# Patient Record
Sex: Female | Born: 1940
Health system: Southern US, Community
[De-identification: ages and names within clinical notes are randomized; demographics above are authoritative.]

## PROBLEM LIST (undated history)

## (undated) DIAGNOSIS — R32 Unspecified urinary incontinence: Secondary | ICD-10-CM

## (undated) DIAGNOSIS — R51 Headache: Secondary | ICD-10-CM

## (undated) DIAGNOSIS — I739 Peripheral vascular disease, unspecified: Secondary | ICD-10-CM

## (undated) DIAGNOSIS — I69398 Other sequelae of cerebral infarction: Principal | ICD-10-CM

## (undated) DIAGNOSIS — M21372 Foot drop, left foot: Secondary | ICD-10-CM

## (undated) DIAGNOSIS — I69359 Hemiplegia and hemiparesis following cerebral infarction affecting unspecified side: Secondary | ICD-10-CM

## (undated) DIAGNOSIS — E7800 Pure hypercholesterolemia, unspecified: Secondary | ICD-10-CM

## (undated) DIAGNOSIS — S129XXA Fracture of neck, unspecified, initial encounter: Secondary | ICD-10-CM

## (undated) DIAGNOSIS — M810 Age-related osteoporosis without current pathological fracture: Secondary | ICD-10-CM

## (undated) DIAGNOSIS — I2510 Atherosclerotic heart disease of native coronary artery without angina pectoris: Secondary | ICD-10-CM

## (undated) DIAGNOSIS — Z780 Asymptomatic menopausal state: Secondary | ICD-10-CM

## (undated) DIAGNOSIS — I82459 Acute embolism and thrombosis of unspecified peroneal vein: Secondary | ICD-10-CM

## (undated) DIAGNOSIS — I219 Acute myocardial infarction, unspecified: Secondary | ICD-10-CM

## (undated) DIAGNOSIS — I639 Cerebral infarction, unspecified: Secondary | ICD-10-CM

## (undated) DIAGNOSIS — R079 Chest pain, unspecified: Secondary | ICD-10-CM

## (undated) DIAGNOSIS — I10 Essential (primary) hypertension: Secondary | ICD-10-CM

## (undated) DIAGNOSIS — R569 Unspecified convulsions: Secondary | ICD-10-CM

## (undated) DIAGNOSIS — R269 Unspecified abnormalities of gait and mobility: Secondary | ICD-10-CM

## (undated) DIAGNOSIS — K5900 Constipation, unspecified: Secondary | ICD-10-CM

## (undated) DIAGNOSIS — R002 Palpitations: Secondary | ICD-10-CM

## (undated) DIAGNOSIS — H05232 Hemorrhage of left orbit: Secondary | ICD-10-CM

## (undated) HISTORY — DX: Unspecified convulsions: R56.9

## (undated) HISTORY — DX: Acute embolism and thrombosis of unspecified peroneal vein: I82.459

## (undated) HISTORY — DX: Atherosclerotic heart disease of native coronary artery without angina pectoris: I25.10

## (undated) HISTORY — DX: Headache: R51

## (undated) HISTORY — DX: Unspecified abnormalities of gait and mobility: R26.9

## (undated) HISTORY — DX: Chest pain, unspecified: R07.9

## (undated) HISTORY — DX: Constipation, unspecified: K59.00

## (undated) HISTORY — DX: Asymptomatic menopausal state: Z78.0

## (undated) HISTORY — DX: Hemiplegia and hemiparesis following cerebral infarction affecting unspecified side: I69.359

## (undated) HISTORY — DX: Peripheral vascular disease, unspecified: I73.9

## (undated) HISTORY — DX: Cerebral infarction, unspecified: I63.9

## (undated) HISTORY — DX: Hemorrhage of left orbit: H05.232

## (undated) HISTORY — DX: Fracture of neck, unspecified, initial encounter: S12.9XXA

## (undated) HISTORY — DX: Palpitations: R00.2

## (undated) HISTORY — DX: Other sequelae of cerebral infarction: I69.398

## (undated) HISTORY — DX: Foot drop, left foot: M21.372

## (undated) HISTORY — PX: CATARACT EXTRACTION: SUR2

## (undated) HISTORY — PX: CORONARY ARTERY BYPASS GRAFT: SHX141

## (undated) HISTORY — DX: Acute myocardial infarction, unspecified: I21.9

## (undated) HISTORY — DX: Age-related osteoporosis without current pathological fracture: M81.0

## (undated) HISTORY — DX: Unspecified urinary incontinence: R32

## (undated) HISTORY — DX: Essential (primary) hypertension: I10

## (undated) HISTORY — DX: Pure hypercholesterolemia, unspecified: E78.00

---

## 1995-09-01 DIAGNOSIS — I2510 Atherosclerotic heart disease of native coronary artery without angina pectoris: Secondary | ICD-10-CM

## 1995-09-01 HISTORY — DX: Atherosclerotic heart disease of native coronary artery without angina pectoris: I25.10

## 2008-08-31 DIAGNOSIS — I639 Cerebral infarction, unspecified: Secondary | ICD-10-CM

## 2008-08-31 HISTORY — DX: Cerebral infarction, unspecified: I63.9

## 2013-03-14 ENCOUNTER — Ambulatory Visit (INDEPENDENT_AMBULATORY_CARE_PROVIDER_SITE_OTHER): Payer: Medicare HMO | Admitting: Neurology

## 2013-03-14 ENCOUNTER — Encounter: Payer: Self-pay | Admitting: Neurology

## 2013-03-14 VITALS — BP 151/70 | HR 65 | Wt 180.0 lb

## 2013-03-14 DIAGNOSIS — R209 Unspecified disturbances of skin sensation: Secondary | ICD-10-CM

## 2013-03-14 DIAGNOSIS — R519 Headache, unspecified: Secondary | ICD-10-CM | POA: Insufficient documentation

## 2013-03-14 DIAGNOSIS — I69959 Hemiplegia and hemiparesis following unspecified cerebrovascular disease affecting unspecified side: Secondary | ICD-10-CM

## 2013-03-14 DIAGNOSIS — R51 Headache: Secondary | ICD-10-CM

## 2013-03-14 DIAGNOSIS — I69359 Hemiplegia and hemiparesis following cerebral infarction affecting unspecified side: Secondary | ICD-10-CM | POA: Insufficient documentation

## 2013-03-14 DIAGNOSIS — I69398 Other sequelae of cerebral infarction: Secondary | ICD-10-CM

## 2013-03-14 HISTORY — DX: Headache: R51

## 2013-03-14 HISTORY — DX: Hemiplegia and hemiparesis following cerebral infarction affecting unspecified side: I69.398

## 2013-03-14 HISTORY — DX: Other sequelae of cerebral infarction: I69.359

## 2013-03-14 NOTE — Progress Notes (Signed)
Reason for visit: Stroke history  [name] [name] is a 72 y.o. female  History of present illness:  [name] [name] is a 72 year old right-handed black female with a history of cerebrovascular disease and coronary artery disease. The patient sustained a stroke in 2010 that left her with a significant left hemiparesis and hemisensory deficit. The patient has required assistance for activities of daily living such as bathing, dressing, and with ambulation. The patient is able to feed herself. The patient currently lives with her daughter in the [place], [place] area, moving from [place], [place]. The patient has been here 2 months. The patient has no medical history information with her, and she is unable to recall exactly what caused her stroke. The patient has been placed on Coumadin since the stroke, and she was placed on Plavix following a stent procedure for coronary artery disease in 2012. The patient has had some problems with rectal bleeding, and a colonoscopy is indicated. The patient is followed by Dr. Evette Cristal, and there is a question whether she is able to come off of Coumadin at this point. The family indicates that she has no known history of atrial fibrillation, or heart valve disease. The patient was told that she was on Coumadin for a "blocked blood vessel". The patient has had seizures following the stroke, and she is on very low-dose Keppra without recurrence of seizures. The last seizure was in 2012. The patient is sent to this office for an evaluation. The patient reports chronic daily headaches that are associated with neck pain and stiffness. The headaches come up the back of the head to the bifrontal region. The patient has a history of a cervical spine fracture following a fall after her stroke, not requiring surgery.  Past Medical History  Diagnosis Date  . CAD (coronary artery disease)   . Stroke   . Hypertension   . Hypercholesterolemia   . Peroneal DVT (deep venous  thrombosis)   . Osteoporosis   . Seizures   . Post-menopausal   . Heart attack   . Peripheral vascular disease     Left superficial femoral artery  . Hemiparesis and alteration of sensations as late effects of stroke 03/14/2013  . Headache(784.0) 03/14/2013  . Cervical spine fracture     Not requiring surgery    Past Surgical History  Procedure Laterality Date  . Cataract extraction    . Coronary artery bypass graft      x3    Family History  Problem Relation Age of Onset  . Heart attack Mother   . Heart attack Father   . Heart attack Sister   . Heart attack Brother     Social history:  reports that she has never smoked. She does not have any smokeless tobacco history on file. She reports that she does not drink alcohol or use illicit drugs.  Medications:  No current outpatient prescriptions on file prior to visit.   No current facility-administered medications on file prior to visit.    Allergies:  Allergies  Allergen Reactions  . Latex Rash  . Penicillins Rash    Hives    ROS:  Out of a complete 14 system review of symptoms, the patient complains only of the following symptoms, and all other reviewed systems are negative.  Weakness, numbness Blood in stool, constipation Joint pain  Blood pressure 151/70, pulse 65, weight 180 lb (81.647 kg).  Physical Exam  General: The patient is alert and cooperative at the time of the examination.  Head: Pupils are equal, round, and reactive to light. Discs are flat bilaterally.  Neck: The neck is supple, no carotid bruits are noted.  Respiratory: The respiratory examination is clear.  Cardiovascular: The cardiovascular examination reveals a regular rate and rhythm, no obvious murmurs or rubs are noted.  Skin: Extremities are without significant edema. The patient wears an AFO brace with the left foot and ankle.  Neurologic Exam  Mental status:  Cranial nerves: Facial symmetry is not present. The patient has  compression of the left nasolabial fold. There is good sensation of the face to pinprick and soft touch bilaterally. The strength of the facial muscles and the muscles to head turning and shoulder shrug are normal bilaterally. Speech is well enunciated, no aphasia or dysarthria is noted. Extraocular movements are full. Visual fields are full, with the exception that the patient has a left inferior quadrantanopsia.  Motor: The motor testing reveals 5 over 5 strength of the right extremities. On the left, the left upper extremity is almost plegic. The patient has 4 minus over 5 strength with extension of the knee on the left leg, 3/5 strength with flexion at the left knee. The patient has a prominent footdrop on the left. Good symmetric motor tone is noted throughout.  Sensory: Sensory testing is intact to pinprick, soft touch, vibration sensation, and position sense on the right extremities, with the exception that position sense is decreased on the right foot. The patient has a significant decrease in pinprick, soft touch, vibration sensation, and position sensation on the left arm and leg. Extinction is noted on the left.  Coordination: Cerebellar testing reveals good finger-nose-finger and heel-to-shin on the right, unable to perform on the left.  Gait and station: The patient is able to stand with assistance, but she is not able to ambulate effectively. Romberg is negative. Tandem gait was not attempted.  Reflexes: Deep tendon reflexes are symmetric and normal bilaterally, with exception of some increase in deep tendon reflexes on the left ankle jerk reflex and left biceps reflex. Toes are downgoing on the right, upgoing on the left.   Assessment/Plan:  One. Right brain stroke  2. Chronic anticoagulation  3. Coronary artery disease  4. History of seizures  5. Gait disorder  The etiology of the stroke is unknown at this time. We are going to attempt to get further medical records. The  indications for Coumadin following stroke were quite limited, and generally involve a significant cardiac issue such as atrial fibrillation, severe cardiomyopathy, or heart valve disease. Unless one of these issues is present, there may be no indication for ongoing use of Coumadin. At any rate, the stroke was in 2010, and it is likely that the patient could come off of Coumadin temporarily for the colonoscopy, and be restarted following the procedure. An EKG should be performed prior to this procedure. I will followup with this patient if needed. We will call the cardiologist in [place], [place], and obtain medical records.   Addendum: We obtained medical records from her cardiologist. Her ejection fraction on a 2-D echocardiogram is 55%. There is no history of atrial fibrillation. It appears that the Coumadin was started secondary to a DVT affecting the left leg. The DVT occurred in February 2011. Coumadin was not given for cerebrovascular disease. Unless there is a history of recurrent DVT, there is no indication for ongoing treatment with Coumadin. Potentially, Coumadin could be stopped, and the patient treated only with Plavix.  Marlan Palau MD 03/14/2013 1:09  PM  [facility] [address] [place], [place] [address]  Phone [phone] Fax [phone]

## 2013-06-22 ENCOUNTER — Telehealth: Payer: Self-pay | Admitting: *Deleted

## 2013-06-22 NOTE — Telephone Encounter (Signed)
I called the office of Dr. Evette Cristal. The patient was placed on Coumadin in 2011 for DVT. There is no cerebrovascular indication for Coumadin. I indicated in my letter from July of 2014 she likely to come off of Coumadin, remain off of Coumadin, and be treated with Plavix only. Okay to stop the Coumadin. I would not restart this medication.

## 2013-06-22 NOTE — Telephone Encounter (Signed)
Hi Mellody Dance,   This got sent to me and I believe she is your patient. Thanks.

## 2013-06-23 ENCOUNTER — Telehealth: Payer: Self-pay | Admitting: Neurology

## 2013-06-28 NOTE — Telephone Encounter (Signed)
I called the patient. I discussed the use of Coumadin. The Coumadin was initially started in 2011 following a DVT in the left leg. There is no evidence of other ongoing issues such as atrial fibrillation, or a hypercoagulable state. There does not appear to be any evidence to support ongoing use of Coumadin.

## 2013-06-28 NOTE — Telephone Encounter (Addendum)
[name] said that she spoke with Dr Anne Hahn and he said that patient could stop taking the  coumadin, wanted to confirm

## 2013-09-29 ENCOUNTER — Encounter (HOSPITAL_COMMUNITY): Payer: Self-pay | Admitting: Emergency Medicine

## 2013-09-29 ENCOUNTER — Emergency Department (HOSPITAL_COMMUNITY)
Admission: EM | Admit: 2013-09-29 | Discharge: 2013-09-29 | Disposition: A | Discharge status: Home / Self Care | Payer: Medicare PPO | Attending: Emergency Medicine | Admitting: Emergency Medicine

## 2013-09-29 ENCOUNTER — Emergency Department (HOSPITAL_COMMUNITY): Payer: Medicare PPO

## 2013-09-29 DIAGNOSIS — Z8781 Personal history of (healed) traumatic fracture: Secondary | ICD-10-CM | POA: Insufficient documentation

## 2013-09-29 DIAGNOSIS — Z91040 Latex allergy status: Secondary | ICD-10-CM | POA: Insufficient documentation

## 2013-09-29 DIAGNOSIS — Z880 Allergy status to penicillin: Secondary | ICD-10-CM | POA: Insufficient documentation

## 2013-09-29 DIAGNOSIS — Z780 Asymptomatic menopausal state: Secondary | ICD-10-CM | POA: Insufficient documentation

## 2013-09-29 DIAGNOSIS — Z8739 Personal history of other diseases of the musculoskeletal system and connective tissue: Secondary | ICD-10-CM | POA: Insufficient documentation

## 2013-09-29 DIAGNOSIS — R51 Headache: Secondary | ICD-10-CM | POA: Insufficient documentation

## 2013-09-29 DIAGNOSIS — Z79899 Other long term (current) drug therapy: Secondary | ICD-10-CM | POA: Insufficient documentation

## 2013-09-29 DIAGNOSIS — I69959 Hemiplegia and hemiparesis following unspecified cerebrovascular disease affecting unspecified side: Secondary | ICD-10-CM | POA: Insufficient documentation

## 2013-09-29 DIAGNOSIS — I10 Essential (primary) hypertension: Secondary | ICD-10-CM | POA: Insufficient documentation

## 2013-09-29 DIAGNOSIS — G40909 Epilepsy, unspecified, not intractable, without status epilepticus: Secondary | ICD-10-CM | POA: Insufficient documentation

## 2013-09-29 DIAGNOSIS — E7800 Pure hypercholesterolemia, unspecified: Secondary | ICD-10-CM | POA: Insufficient documentation

## 2013-09-29 DIAGNOSIS — Z86718 Personal history of other venous thrombosis and embolism: Secondary | ICD-10-CM | POA: Insufficient documentation

## 2013-09-29 DIAGNOSIS — I252 Old myocardial infarction: Secondary | ICD-10-CM | POA: Insufficient documentation

## 2013-09-29 LAB — RAPID URINE DRUG SCREEN, HOSP PERFORMED
AMPHETAMINES: NOT DETECTED
BARBITURATES: NOT DETECTED
BENZODIAZEPINES: NOT DETECTED
Cocaine: NOT DETECTED
Opiates: NOT DETECTED
TETRAHYDROCANNABINOL: NOT DETECTED

## 2013-09-29 LAB — URINE MICROSCOPIC-ADD ON

## 2013-09-29 LAB — POCT I-STAT, CHEM 8
BUN: 8 mg/dL (ref 6–23)
Calcium, Ion: 1.17 mmol/L (ref 1.13–1.30)
Chloride: 105 mEq/L (ref 96–112)
Creatinine, Ser: 0.8 mg/dL (ref 0.50–1.10)
Glucose, Bld: 132 mg/dL — ABNORMAL HIGH (ref 70–99)
HEMATOCRIT: 45 % (ref 36.0–46.0)
HEMOGLOBIN: 15.3 g/dL — AB (ref 12.0–15.0)
Potassium: 3.6 mEq/L — ABNORMAL LOW (ref 3.7–5.3)
SODIUM: 142 meq/L (ref 137–147)
TCO2: 24 mmol/L (ref 0–100)

## 2013-09-29 LAB — URINALYSIS, ROUTINE W REFLEX MICROSCOPIC
Bilirubin Urine: NEGATIVE
GLUCOSE, UA: NEGATIVE mg/dL
Hgb urine dipstick: NEGATIVE
KETONES UR: NEGATIVE mg/dL
LEUKOCYTES UA: NEGATIVE
NITRITE: NEGATIVE
Protein, ur: 30 mg/dL — AB
Specific Gravity, Urine: 1.011 (ref 1.005–1.030)
UROBILINOGEN UA: 0.2 mg/dL (ref 0.0–1.0)
pH: 7 (ref 5.0–8.0)

## 2013-09-29 LAB — ETHANOL: Alcohol, Ethyl (B): <11 mg/dL (ref 0–11)

## 2013-09-29 LAB — CBC
HCT: 42.2 % (ref 36.0–46.0)
HEMOGLOBIN: 13.9 g/dL (ref 12.0–15.0)
MCH: 28.6 pg (ref 26.0–34.0)
MCHC: 32.9 g/dL (ref 30.0–36.0)
MCV: 86.8 fL (ref 78.0–100.0)
PLATELETS: 205 10*3/uL (ref 150–400)
RBC: 4.86 MIL/uL (ref 3.87–5.11)
RDW: 12.7 % (ref 11.5–15.5)
WBC: 5.4 10*3/uL (ref 4.0–10.5)

## 2013-09-29 LAB — COMPREHENSIVE METABOLIC PANEL
ALBUMIN: 3.5 g/dL (ref 3.5–5.2)
ALK PHOS: 72 U/L (ref 39–117)
ALT: 16 U/L (ref 0–35)
AST: 19 U/L (ref 0–37)
BILIRUBIN TOTAL: 0.3 mg/dL (ref 0.3–1.2)
BUN: 10 mg/dL (ref 6–23)
CO2: 23 mEq/L (ref 19–32)
Calcium: 9.2 mg/dL (ref 8.4–10.5)
Chloride: 102 mEq/L (ref 96–112)
Creatinine, Ser: 0.75 mg/dL (ref 0.50–1.10)
GFR calc Af Amer: >90 mL/min (ref >90)
GFR calc non Af Amer: 84 mL/min — ABNORMAL LOW (ref >90)
Glucose, Bld: 131 mg/dL — ABNORMAL HIGH (ref 70–99)
POTASSIUM: 3.6 meq/L — AB (ref 3.7–5.3)
SODIUM: 140 meq/L (ref 137–147)
Total Protein: 7.6 g/dL (ref 6.0–8.3)

## 2013-09-29 LAB — DIFFERENTIAL
BASOS PCT: 0 % (ref 0–1)
Basophils Absolute: 0 10*3/uL (ref 0.0–0.1)
Eosinophils Absolute: 0 10*3/uL (ref 0.0–0.7)
Eosinophils Relative: 0 % (ref 0–5)
Lymphocytes Relative: 29 % (ref 12–46)
Lymphs Abs: 1.6 10*3/uL (ref 0.7–4.0)
MONOS PCT: 5 % (ref 3–12)
Monocytes Absolute: 0.3 10*3/uL (ref 0.1–1.0)
NEUTROS ABS: 3.5 10*3/uL (ref 1.7–7.7)
Neutrophils Relative %: 65 % (ref 43–77)

## 2013-09-29 LAB — APTT: aPTT: 26 seconds (ref 24–37)

## 2013-09-29 LAB — GLUCOSE, CAPILLARY: Glucose-Capillary: 103 mg/dL — ABNORMAL HIGH (ref 70–99)

## 2013-09-29 LAB — TROPONIN I

## 2013-09-29 LAB — POCT I-STAT TROPONIN I: TROPONIN I, POC: 0.02 ng/mL (ref 0.00–0.08)

## 2013-09-29 LAB — PROTIME-INR
INR: 1.03 (ref 0.00–1.49)
Prothrombin Time: 13.3 seconds (ref 11.6–15.2)

## 2013-09-29 MED ORDER — SODIUM CHLORIDE 0.9 % IV SOLN
500.0000 mg | Freq: Once | INTRAVENOUS | Status: AC
  Administered 2013-09-29: 500 mg via INTRAVENOUS
  Filled 2013-09-29: qty 5

## 2013-09-29 MED ORDER — LEVETIRACETAM 500 MG PO TABS: 500.0000 mg | ORAL_TABLET | Freq: Two times a day (BID) | ORAL | Status: DC

## 2013-09-29 NOTE — ED Notes (Signed)
Pt given water. Pt drinking well.

## 2013-09-29 NOTE — ED Notes (Signed)
Pt leaned heavily on daughter while ambulating. Pt had shuffling gait. Pt normally uses cane to walk. Cane not available. Pt walked about 20 ft

## 2013-09-29 NOTE — ED Notes (Signed)
Patient transported to CT 

## 2013-09-29 NOTE — Discharge Instructions (Signed)
Seizure, Adult Increase your keppra to 500 mg twice a day. Follow up with Dr. Anne HahnWillis this week. Return to the ED if you develop new or worsening symptoms. A seizure is abnormal electrical activity in the brain. Seizures usually last from 30 seconds to 2 minutes. There are various types of seizures. Before a seizure, you may have a warning sensation (aura) that a seizure is about to occur. An aura may include the following symptoms:   Fear or anxiety.  Nausea.  Feeling like the room is spinning (vertigo).  Vision changes, such as seeing flashing lights or spots. Common symptoms during a seizure include:  A change in attention or behavior (altered mental status).  Convulsions with rhythmic jerking movements.  Drooling.  Rapid eye movements.  Grunting.  Loss of bladder and bowel control.  Bitter taste in the mouth.  Tongue biting. After a seizure, you may feel confused and sleepy. You may also have an injury resulting from convulsions during the seizure. HOME CARE INSTRUCTIONS   If you are given medicines, take them exactly as prescribed by your health care provider.  Keep all follow-up appointments as directed by your health care provider.  Do not swim or drive or engage in risky activity during which a seizure could cause further injury to you or others until your health care provider says it is OK.  Get adequate rest.  Teach friends and family what to do if you have a seizure. They should:  Lay you on the ground to prevent a fall.  Put a cushion under your head.  Loosen any tight clothing around your neck.  Turn you on your side. If vomiting occurs, this helps keep your airway clear.  Stay with you until you recover.  Know whether or not you need emergency care. SEEK IMMEDIATE MEDICAL CARE IF:  The seizure lasts longer than 5 minutes.  The seizure is severe or you do not wake up immediately after the seizure.  You have an altered mental status after the  seizure.  You are having more frequent or worsening seizures. Someone should drive you to the emergency department or call local emergency services (911 in [place] MAKE SURE YOU:  Understand these instructions.  Will watch your condition.  Will get help right away if you are not doing well or get worse. Document Released: 08/14/2000 Document Revised: 06/07/2013 Document Reviewed: 03/29/2013 [facility] Patient Information 2014 [employer] [place].

## 2013-09-29 NOTE — ED Notes (Signed)
Patient transported to X-ray 

## 2013-09-29 NOTE — ED Notes (Signed)
Daughter believes that the seizure was caused because her mother did not take her seizure medication this morning.

## 2013-09-29 NOTE — ED Provider Notes (Signed)
CSN: [id number]     Arrival date & time 09/29/13  1136 History   First MD Initiated Contact with Patient 09/29/13 1139     Chief Complaint  Patient presents with  . Seizures   (Consider location/radiation/quality/duration/timing/severity/associated sxs/prior Treatment) HPI Comments: Patient presents from home with witnessed seizure activity by the daughter. Patient has a history of previous stroke with left-sided hemiparesis and has had 3 seizures in the past 5 years. She takes Keppra. Patient is postictal and unable to give a history. She denies any chest pain or shortness of breath. She endorses a headache. She has been vomiting for EMS. She is confused and thinks that she is in [place] and [place] president [place]. There is no description of the seizure activity available. No tongue biting or incontinence.  Daughter reports that patient seizure activity was typical and her eyes rolled back in her head and her face started shaking and she was making snoring noise. Is lasted for a few minutes. She did not take her Keppra dose today. Her last seizure was a few months ago. Daughter reports that patient was taken off of her Coumadin about 6 months ago by Dr. Anne HahnWillis.  The history is provided by the patient and the EMS personnel. The history is limited by the condition of the patient.    Past Medical History  Diagnosis Date  . CAD (coronary artery disease)   . Stroke   . Hypertension   . Hypercholesterolemia   . Peroneal DVT (deep venous thrombosis)   . Osteoporosis   . Seizures   . Post-menopausal   . Heart attack   . Peripheral vascular disease     Left superficial femoral artery  . Hemiparesis and alteration of sensations as late effects of stroke 03/14/2013  . Headache(784.0) 03/14/2013  . Cervical spine fracture     Not requiring surgery   Past Surgical History  Procedure Laterality Date  . Cataract extraction    . Coronary artery bypass graft      x3   Family  History  Problem Relation Age of Onset  . Heart attack Mother   . Heart attack Father   . Heart attack Sister   . Heart attack Brother    History  Substance Use Topics  . Smoking status: Never Smoker   . Smokeless tobacco: Not on file  . Alcohol Use: No   OB History   Grav Para Term Preterm Abortions TAB SAB Ect Mult Living                 Review of Systems  Unable to perform ROS: Mental status change    Allergies  Latex and Penicillins  Home Medications   Current Outpatient Rx  Name  Route  Sig  Dispense  Refill  . acetaminophen (TYLENOL) 500 MG tablet   Oral   Take 500-1,000 mg by mouth every 8 (eight) hours as needed for mild pain.         . cholecalciferol (VITAMIN D) 1000 UNITS tablet   Oral   Take 1,000 Units by mouth daily.         Marland Kitchen. levETIRAcetam (KEPPRA) 500 MG tablet   Oral   Take 250 mg by mouth daily.          Marland Kitchen. lisinopril (PRINIVIL,ZESTRIL) 20 MG tablet   Oral   Take 20 mg by mouth daily.         Marland Kitchen. lovastatin (MEVACOR) 40 MG tablet   Oral  Take 40 mg by mouth at bedtime.         . metoprolol succinate (TOPROL-XL) 50 MG 24 hr tablet   Oral   Take 50 mg by mouth daily. Take with or immediately following a meal.         . levETIRAcetam (KEPPRA) 500 MG tablet   Oral   Take 1 tablet (500 mg total) by mouth 2 (two) times daily.   30 tablet   0    BP 158/65  Pulse 88  Resp 23  SpO2 97% Physical Exam  Constitutional: She appears well-developed and well-nourished. No distress.  Alert, confused and appears postictal.  HENT:  Head: Normocephalic and atraumatic.  Mouth/Throat: Oropharynx is clear and moist. No oropharyngeal exudate.  Eyes: Conjunctivae and EOM are normal. Pupils are equal, round, and reactive to light.  Neck: Normal range of motion. Neck supple.  Cardiovascular: Normal rate, regular rhythm and normal heart sounds.   No murmur heard. Pulmonary/Chest: Effort normal and breath sounds normal. No respiratory distress.   Abdominal: Soft. There is no tenderness. There is no rebound and no guarding.  Musculoskeletal: Normal range of motion. She exhibits no edema and no tenderness.  Neurological: She is alert. A cranial nerve deficit is present. Coordination abnormal.  Left-sided hemiparesis which is apparently baseline.   Skin: Skin is warm.    ED Course  Procedures (including critical care time) Labs Review Labs Reviewed  COMPREHENSIVE METABOLIC PANEL - Abnormal; Notable for the following:    Potassium 3.6 (*)    Glucose, Bld 131 (*)    GFR calc non Af Amer 84 (*)    All other components within normal limits  URINALYSIS, ROUTINE W REFLEX MICROSCOPIC - Abnormal; Notable for the following:    APPearance CLOUDY (*)    Protein, ur 30 (*)    All other components within normal limits  GLUCOSE, CAPILLARY - Abnormal; Notable for the following:    Glucose-Capillary 103 (*)    All other components within normal limits  URINE MICROSCOPIC-ADD ON - Abnormal; Notable for the following:    Squamous Epithelial / LPF MANY (*)    All other components within normal limits  POCT I-STAT, CHEM 8 - Abnormal; Notable for the following:    Potassium 3.6 (*)    Glucose, Bld 132 (*)    Hemoglobin 15.3 (*)    All other components within normal limits  ETHANOL  PROTIME-INR  APTT  CBC  DIFFERENTIAL  TROPONIN I  URINE RAPID DRUG SCREEN (HOSP PERFORMED)  POCT I-STAT TROPONIN I   Imaging Review Dg Chest 2 View  09/29/2013   CLINICAL DATA:  Seizures, cough  EXAM: CHEST  2 VIEW  COMPARISON:  None.  FINDINGS: Bilateral diffuse interstitial thickening and peribronchial cuffing most concerning for acute versus chronic bronchitis. There is no focal parenchymal opacity, pleural effusion, or pneumothorax. The heart and mediastinal contours are unremarkable. Prior CABG.  The osseous structures are unremarkable.  IMPRESSION: Bilateral diffuse interstitial thickening and peribronchial cuffing most concerning for acute versus chronic  bronchitis.   Electronically Signed   By: Elige Ko   On: 09/29/2013 12:59   Ct Head Wo Contrast  09/29/2013   CLINICAL DATA:  Seizure.  History of stroke.  EXAM: CT HEAD WITHOUT CONTRAST  TECHNIQUE: Contiguous axial images were obtained from the base of the skull through the vertex without intravenous contrast.  COMPARISON:  None.  FINDINGS: There is old infarction in the right middle cerebral artery territory with atrophy and  encephalomalacia affecting the right temporal lobe, insular region and frontoparietal junction region. Right basal ganglia also involved. There is ex vacuo enlargement of the right lateral ventricle. There is no sign of acute infarction, mass lesion, hemorrhage, hydrocephalus or extra-axial collection. There is wallerian degeneration of the brainstem on the right.  IMPRESSION: Old infarction in the right MCA territory with atrophy and encephalomalacia. No acute finding.   Electronically Signed   By: Paulina Fusi M.D.   On: 09/29/2013 13:43   Dg Abd 2 Views  09/29/2013   CLINICAL DATA:  Nausea, vomiting, seizure, left hemiplegia, history stroke  EXAM: ABDOMEN - 2 VIEW  COMPARISON:  None  FINDINGS: Normal bowel gas pattern.  No bowel dilatation or bowel wall thickening or free intraperitoneal air.  Diffuse osseous demineralization.  Mild degenerative disc and facet disease changes lower lumbar spine.  Narrowing of bilateral hip joints.  No urinary tract calcification.  IMPRESSION: Normal bowel gas pattern.   Electronically Signed   By: Ulyses Southward M.D.   On: 09/29/2013 14:06    EKG Interpretation   None       MDM   1. Seizure disorder    Altered mental status after witnessed seizure at home. History of seizures on Keppra. Daughter states compliance but did not take dose today.  CT head shows findings of old infarct without acute finding. Patient reports she is feeling better. Her mental status has improved throughout her stay in the ED. She is alert and oriented to  person and place. Her daughter states that she is at her baseline.  UA negative. No evidence of PNA.  Patient ambulatory and tolerating PO.   Her daughter admits the patient has missed her dose of Keppra for the past 2 days. This is discussed with Dr. Leroy Kennedy of neurology who recommended 500 mg loading dose followed by increasing her Keppra 500 mg twice daily.    Date: 09/29/2013  Rate: 80  Rhythm: normal sinus rhythm  QRS Axis: normal  Intervals: normal  ST/T Wave abnormalities: nonspecific ST/T changes  Conduction Disutrbances:none  Narrative Interpretation: LVH  Old EKG Reviewed: none available    Glynn Octave, MD 09/29/13 [phone]

## 2013-09-29 NOTE — ED Notes (Signed)
Pt arrived via EMS from home where she had a witnessed seizure in the bathroom. Pt had a CVA 4 years ago and has had seizures since. Daughter to EMS that the seizure last a few minutes and patient has remained post-ictal since (Aprox 45 minutes.) Pt complains of nausea and vomited with EMS. EMS gave her 4 mg of zofran via 20 g in the hand.  VS- 160/90 HR- 90 NSR O2- 90% on 2L CBG- 127 Hx of CVA, HTN, and seizures.

## 2013-10-02 ENCOUNTER — Telehealth: Payer: Self-pay | Admitting: Neurology

## 2013-10-02 NOTE — Telephone Encounter (Signed)
Called patient and left message to call back for an appt.  With a NP per Dr. Anne HahnWillis. Patient needs to be seen with in the next week or 2.

## 2013-10-03 ENCOUNTER — Telehealth: Payer: Self-pay | Admitting: *Deleted

## 2013-10-03 NOTE — Telephone Encounter (Signed)
I called and spoke to daughter of pt.  Appt made 10-13-13 with Heide GuileLynn Lam, NP (post seizure 09-29-13).

## 2013-10-03 NOTE — Telephone Encounter (Signed)
Left message for patient to call and schedule hosp f/u.

## 2013-10-13 ENCOUNTER — Ambulatory Visit: Payer: Self-pay | Admitting: Nurse Practitioner

## 2013-10-19 ENCOUNTER — Ambulatory Visit: Payer: Self-pay | Admitting: Nurse Practitioner

## 2013-11-08 ENCOUNTER — Ambulatory Visit (INDEPENDENT_AMBULATORY_CARE_PROVIDER_SITE_OTHER): Payer: Medicare HMO | Admitting: Nurse Practitioner

## 2013-11-08 ENCOUNTER — Encounter: Payer: Self-pay | Admitting: Nurse Practitioner

## 2013-11-08 VITALS — BP 140/76 | HR 64

## 2013-11-08 DIAGNOSIS — G40309 Generalized idiopathic epilepsy and epileptic syndromes, not intractable, without status epilepticus: Secondary | ICD-10-CM

## 2013-11-08 DIAGNOSIS — I69359 Hemiplegia and hemiparesis following cerebral infarction affecting unspecified side: Secondary | ICD-10-CM

## 2013-11-08 DIAGNOSIS — R519 Headache, unspecified: Secondary | ICD-10-CM

## 2013-11-08 DIAGNOSIS — I69959 Hemiplegia and hemiparesis following unspecified cerebrovascular disease affecting unspecified side: Secondary | ICD-10-CM

## 2013-11-08 DIAGNOSIS — I69998 Other sequelae following unspecified cerebrovascular disease: Secondary | ICD-10-CM

## 2013-11-08 DIAGNOSIS — I69398 Other sequelae of cerebral infarction: Principal | ICD-10-CM

## 2013-11-08 DIAGNOSIS — R51 Headache: Secondary | ICD-10-CM

## 2013-11-08 DIAGNOSIS — G40909 Epilepsy, unspecified, not intractable, without status epilepticus: Secondary | ICD-10-CM | POA: Insufficient documentation

## 2013-11-08 DIAGNOSIS — R569 Unspecified convulsions: Secondary | ICD-10-CM

## 2013-11-08 DIAGNOSIS — R209 Unspecified disturbances of skin sensation: Secondary | ICD-10-CM

## 2013-11-08 MED ORDER — DIVALPROEX SODIUM ER 500 MG PO TB24: 500.0000 mg | ORAL_TABLET | Freq: Every day | ORAL | Status: DC

## 2013-11-08 MED ORDER — ASPIRIN EC 81 MG PO TBEC: 81.0000 mg | DELAYED_RELEASE_TABLET | Freq: Every day | ORAL | Status: DC

## 2013-11-08 NOTE — Patient Instructions (Signed)
Start Depakote ER for daily headache.  Take 1 tablet daily.    Follow up in 2 months so we can assess if this medicine is helping with the headache.  If it is, we will stop the Keppra and continue on just the Depakote because it will prevent seizure too.

## 2013-11-08 NOTE — Progress Notes (Signed)
I have read the note, and I agree with the clinical assessment and plan.  WILLIS,CHARLES KEITH   

## 2013-11-08 NOTE — Progress Notes (Signed)
PATIENT: [name] [name] DOB: Sep 10, 1940  REASON FOR VISIT: follow up for seizure HISTORY FROM: patient  HISTORY OF PRESENT ILLNESS: [name] [name] is a 73 year old right-handed black female with a history of cerebrovascular disease and coronary artery disease. The patient sustained a stroke in 2010 that left her with a significant left hemiparesis and hemisensory deficit. The patient has required assistance for activities of daily living such as bathing, dressing, and with ambulation. The patient is able to feed herself. The patient currently lives with her daughter in the [place], [place] area, moving from [place], [place]. The patient has been here 2 months. The patient has no medical history information with her, and she is unable to recall exactly what caused her stroke. The patient has been placed on Coumadin since the stroke, and she was placed on Plavix following a stent procedure for coronary artery disease in 2012. The patient has had some problems with rectal bleeding, and a colonoscopy is indicated. The patient is followed by Dr. Evette Cristal, and there is a question whether she is able to come off of Coumadin at this point. The family indicates that she has no known history of atrial fibrillation, or heart valve disease. The patient was told that she was on Coumadin for a "blocked blood vessel". The patient has had seizures following the stroke, and she is on very low-dose Keppra without recurrence of seizures. The last seizure was in 2012. The patient is sent to this office for an evaluation. The patient reports chronic daily headaches that are associated with neck pain and stiffness. The headaches come up the back of the head to the bifrontal region. The patient has a history of a cervical spine fracture following a fall after her stroke, not requiring surgery.  UPDATE 11/08/13 (LL): Patient comes in for follow up after having a seizure 09-29-13.  Her daughter states that she had mistakenly left  the keppra out of her pill box and she missed 2 doses before the seizure.  She has not had any since, and had been maintained on 250 mg daily for over a year.  She states that she has a daily headache that she wakes up with since the stroke, on the right parietal region.  She takes a Tylenol which relieves it, but it is every day.  Otherwise she is well.  ROS:  Out of a complete 14 system review of symptoms, the patient complains only of the following symptoms, and all other reviewed systems are negative.  Dizziness, Headache, Numbness, Seizure, Walking difficulty  ALLERGIES: Allergies  Allergen Reactions  . Latex Rash  . Penicillins Rash    Hives    HOME MEDICATIONS: Outpatient Prescriptions Prior to Visit  Medication Sig Dispense Refill  . acetaminophen (TYLENOL) 500 MG tablet Take 500-1,000 mg by mouth every 8 (eight) hours as needed for mild pain.      . cholecalciferol (VITAMIN D) 1000 UNITS tablet Take 1,000 Units by mouth daily.      Marland Kitchen lisinopril (PRINIVIL,ZESTRIL) 20 MG tablet Take 20 mg by mouth daily.      . metoprolol succinate (TOPROL-XL) 50 MG 24 hr tablet Take 50 mg by mouth daily. Take with or immediately following a meal.      . levETIRAcetam (KEPPRA) 500 MG tablet Take 1 tablet (500 mg total) by mouth 2 (two) times daily.  30 tablet  0  . levETIRAcetam (KEPPRA) 500 MG tablet Take 250 mg by mouth daily.       Marland Kitchen lovastatin (  MEVACOR) 40 MG tablet Take 40 mg by mouth at bedtime.       No facility-administered medications prior to visit.     PHYSICAL EXAM  Filed Vitals:   11/08/13 1314  BP: 140/76  Pulse: 64   Cannot calculate BMI with a height equal to zero.  Cranial nerves: Facial symmetry is not present. The patient has compression of the left nasolabial fold. There is good sensation of the face to pinprick and soft touch bilaterally. The strength of the facial muscles and the muscles to head turning and shoulder shrug are normal bilaterally. Speech is well  enunciated, no aphasia or dysarthria is noted. Extraocular movements are full. Visual fields are full, with the exception that the patient has a left inferior quadrantanopsia.  Motor: The motor testing reveals 5 over 5 strength of the right extremities. On the left, the left upper extremity is almost plegic. The patient has 4 minus over 5 strength with extension of the knee on the left leg, 3/5 strength with flexion at the left knee. The patient has a prominent footdrop on the left. Good symmetric motor tone is noted throughout.  Sensory: Sensory testing is intact to pinprick, soft touch, vibration sensation, and position sense on the right extremities, with the exception that position sense is decreased on the right foot. The patient has a significant decrease in pinprick, soft touch, vibration sensation, and position sensation on the left arm and leg. Extinction is noted on the left.  Coordination: Cerebellar testing reveals good finger-nose-finger and heel-to-shin on the right, unable to perform on the left.  Gait and station: The patient is able to stand with assistance, but she is not able to ambulate effectively. Romberg is negative. Tandem gait was not attempted.  Reflexes: Deep tendon reflexes are symmetric and normal bilaterally, with exception of some increase in deep tendon reflexes on the left ankle jerk reflex and left biceps reflex. Toes are downgoing on the right, upgoing on the left.   ASSESSMENT AND PLAN [name] [name] is a 73 year old AA female who suffered a right brain stroke in 2010 and has had seizure disorder since that time.  She is maintained on Keppra; last seizure was on Sep 29, 2013 when she accidentally missed 2 doses of her Keppra.  She has chronic daily headache since the stroke and starting on Keppra.  Trial of Depakote ER to see if it is helpful with headache; if so, we will continue with that for seizure prophylaxis as well and discontinue Keppra.  1. Right brain stroke    2. Coronary artery disease  3. History of seizures  4. Gait disorder  5. Chronic Daily Headache  Meds ordered this encounter  Medications  . divalproex (DEPAKOTE ER) 500 MG 24 hr tablet    Sig: Take 1 tablet (500 mg total) by mouth daily.    Dispense:  30 tablet    Refill:  5    Order Specific Question:  Supervising Provider    Answer:  York SpanielWILLIS, CHARLES K [[id number]]   Return in about 2 months (around 01/08/2014).  Ronal FearLYNN E. Firman Petrow, MSN, NP-C 11/08/2013, 2:01 PM [facility] [address], [address] [place], [place] [address] [phone]  Note: This document was prepared with digital dictation and possible smart phrase technology. Any transcriptional errors that result from this process are unintentional.

## 2014-01-15 ENCOUNTER — Ambulatory Visit: Payer: Medicare HMO | Admitting: Nurse Practitioner

## 2014-01-16 ENCOUNTER — Telehealth: Payer: Self-pay | Admitting: Nurse Practitioner

## 2014-01-16 NOTE — Telephone Encounter (Signed)
Patient was no show for today's office appointment.  

## 2014-02-02 ENCOUNTER — Encounter: Payer: Self-pay | Admitting: Nurse Practitioner

## 2014-02-02 ENCOUNTER — Encounter (INDEPENDENT_AMBULATORY_CARE_PROVIDER_SITE_OTHER): Payer: Self-pay

## 2014-02-02 ENCOUNTER — Ambulatory Visit (INDEPENDENT_AMBULATORY_CARE_PROVIDER_SITE_OTHER): Payer: Medicare PPO | Admitting: Nurse Practitioner

## 2014-02-02 VITALS — BP 168/84 | HR 64 | Temp 97.6°F | Ht 68.0 in | Wt 186.0 lb

## 2014-02-02 DIAGNOSIS — I69998 Other sequelae following unspecified cerebrovascular disease: Secondary | ICD-10-CM

## 2014-02-02 DIAGNOSIS — I69959 Hemiplegia and hemiparesis following unspecified cerebrovascular disease affecting unspecified side: Secondary | ICD-10-CM

## 2014-02-02 DIAGNOSIS — R519 Headache, unspecified: Secondary | ICD-10-CM

## 2014-02-02 DIAGNOSIS — R569 Unspecified convulsions: Secondary | ICD-10-CM

## 2014-02-02 DIAGNOSIS — R51 Headache: Secondary | ICD-10-CM

## 2014-02-02 DIAGNOSIS — I69398 Other sequelae of cerebral infarction: Principal | ICD-10-CM

## 2014-02-02 DIAGNOSIS — R209 Unspecified disturbances of skin sensation: Secondary | ICD-10-CM

## 2014-02-02 DIAGNOSIS — I69359 Hemiplegia and hemiparesis following cerebral infarction affecting unspecified side: Secondary | ICD-10-CM

## 2014-02-02 MED ORDER — ASPIRIN EC 81 MG PO TBEC: 81.0000 mg | DELAYED_RELEASE_TABLET | Freq: Every day | ORAL | Status: DC

## 2014-02-02 NOTE — Progress Notes (Signed)
PATIENT: [name] [name] DOB: 07/18/1941  REASON FOR VISIT: routine follow up for seizure disorder HISTORY FROM: patient  HISTORY OF PRESENT ILLNESS: Ms. [name] [name] is a 73 year old right-handed black female with a history of cerebrovascular disease and coronary artery disease. The patient sustained a stroke in 2010 that left her with a significant left hemiparesis and hemisensory deficit. The patient has required assistance for activities of daily living such as bathing, dressing, and with ambulation. The patient is able to feed herself. The patient currently lives with her [place] in the [place], [place] from [place], [place]. The patient has been here 2 months. The patient has no medical history information with her, and she is unable to recall exactly what caused her stroke. The patient has been placed on Coumadin since the stroke, and she was placed on Plavix following a stent procedure for coronary artery disease in 2012. The patient has had some problems with rectal bleeding, and a colonoscopy was indicated. The patient is followed by Dr. Evette CristalGanem, and there is a question whether she is able to come off of Coumadin at this point. The family indicates that she has no known history of atrial fibrillation, or heart valve disease. The patient was told that she was on Coumadin for a "blocked blood vessel". The patient has had seizures following the stroke, and she is on very low-dose Keppra without recurrence of seizures. The last seizure was in 2012. The patient is sent to this office for an evaluation. The patient reports chronic daily headaches that are associated with neck pain and stiffness. The headaches come up the back of the head to the bifrontal region. The patient has a history of a cervical spine fracture following a fall after her stroke, not requiring surgery.  UPDATE 11/08/13 (LL): Patient comes in for follow up after having a seizure 09-29-13. Her daughter states that she had  mistakenly left the keppra out of her pill box and she missed 2 doses before the seizure. She has not had any since, and had been maintained on 250 mg daily for over a year. She states that she has a daily headache that she wakes up with since the stroke, on the right parietal region. She takes a Tylenol which relieves it, but it is every day. Otherwise she is well.  UPDATE 02/02/14 (LL): Since last visit, no seizures.  Daughter states she tried Depakote for 30 days but headaches did not improve so she did not refill it.  Patient does not want to try another medication for headache prevention or change from the Keppra.  Tylenol does relieve the headache.  No new neurovascular symptoms.  ROS:  Out of a complete 14 system review of symptoms, the patient complains only of the following symptoms, and all other reviewed systems are negative.  Headache, Walking difficulty, daytime sleepiness  ALLERGIES: Allergies  Allergen Reactions  . Latex Rash  . Penicillins Rash    Hives    HOME MEDICATIONS: Outpatient Prescriptions Prior to Visit  Medication Sig Dispense Refill  . acetaminophen (TYLENOL) 500 MG tablet Take 500-1,000 mg by mouth every 8 (eight) hours as needed for mild pain.      Marland Kitchen. atorvastatin (LIPITOR) 20 MG tablet Take 20 mg by mouth daily.      . cholecalciferol (VITAMIN D) 1000 UNITS tablet Take 1,000 Units by mouth daily.      Marland Kitchen. levETIRAcetam (KEPPRA) 500 MG tablet Take 250 mg by mouth every morning.      .Marland Kitchen  lisinopril (PRINIVIL,ZESTRIL) 20 MG tablet Take 20 mg by mouth daily.      . metoprolol succinate (TOPROL-XL) 50 MG 24 hr tablet Take 50 mg by mouth daily. Take with or immediately following a meal.      . aspirin EC 81 MG tablet Take 1 tablet (81 mg total) by mouth daily.       No facility-administered medications prior to visit.     PHYSICAL EXAM  Filed Vitals:   02/02/14 1129  BP: 168/84  Pulse: 64  Temp: 97.6 F (36.4 C)  TempSrc: Oral  Height: 5\' 8"  (1.727 m)    Weight: 186 lb (84.369 kg)   Body mass index is 28.29 kg/(m^2). No exam data present   Generalized: Well developed, in no acute distress  Head: normocephalic and atraumatic. Oropharynx benign  Neck: Supple, no carotid bruits  Cardiac: Regular rate rhythm, no murmur   Neurological examination  Mentation: Alert oriented to time, place, history taking. Follows all commands speech and language fluent Cranial nerves: Facial symmetry is not present. The patient has compression of the left nasolabial fold. There is good sensation of the face to pinprick and soft touch bilaterally. The strength of the facial muscles and the muscles to head turning and shoulder shrug are normal bilaterally. Speech is well enunciated, no aphasia or dysarthria is noted. Extraocular movements are full. Visual fields are full, with the exception that the patient has a left inferior quadrantanopsia.  Motor: The motor testing reveals 5 over 5 strength of the right extremities. On the left, the left upper extremity is almost plegic. The patient has 4 minus over 5 strength with extension of the knee on the left leg, 3/5 strength with flexion at the left knee. The patient has a prominent footdrop on the left. Good symmetric motor tone is noted throughout.  Sensory: Sensory testing is intact to pinprick, soft touch, vibration sensation, and position sense on the right extremities, with the exception that position sense is decreased on the right foot. The patient has a significant decrease in pinprick, soft touch, vibration sensation, and position sensation on the left arm and leg. Extinction is noted on the left.  Coordination: Cerebellar testing reveals good finger-nose-finger and heel-to-shin on the right, unable to perform on the left.  Gait and station: The patient is able to stand with assistance, but she is not able to ambulate effectively. Romberg is negative. Tandem gait was not attempted.  Reflexes: Deep tendon reflexes  are symmetric and normal bilaterally, with exception of some increase in deep tendon reflexes on the left ankle jerk reflex and left biceps reflex. Toes are downgoing on the right, upgoing on the left.   ASSESSMENT AND PLAN [name] [name] is a 73 year old AA female who suffered a right brain stroke in 2010 with left sided hemiplegia and has had seizure disorder since that time. She is maintained on Keppra; last seizure was on Sep 29, 2013 when she accidentally missed 2 doses of her Keppra. She has chronic daily headache since the stroke and starting on Keppra. Tried trial of Depakote ER to see if it is helpful with headache, patient took for 30 days and did not have decrease in headaches so she did not refill it.  Offered to try it again with plan to wean off Keppra (as headache may be coming from Keppra) but she declined.  1. Right brain stroke  2. Coronary artery disease  3. History of seizures  4. Gait disorder  5.  Chronic Daily Headache   Continue with current dose of Keppra for seizure prevention. Add a 81 mg aspirin daily for secondary stroke prevention. Follow up in our office in 6 months, sooner as needed.  Ronal Fear, MSN, NP-C 02/02/2014, 11:53 AM [facility] [address] [place], [place] [address] [phone]  Note: This document was prepared with digital dictation and possible smart phrase technology. Any transcriptional errors that result from this process are unintentional.

## 2014-02-02 NOTE — Progress Notes (Signed)
I have read the note, and I agree with the clinical assessment and plan.  Charles K Willis   

## 2014-02-02 NOTE — Patient Instructions (Signed)
Continue with current dose of Keppra for seizure prevention.  Add a 81 mg aspirin daily for secondary stroke prevention.  Follow up in our office in 6 months, sooner as needed.

## 2014-04-05 ENCOUNTER — Emergency Department (HOSPITAL_COMMUNITY): Payer: Medicare PPO

## 2014-04-05 ENCOUNTER — Encounter (HOSPITAL_COMMUNITY): Payer: Self-pay | Admitting: Emergency Medicine

## 2014-04-05 ENCOUNTER — Emergency Department (HOSPITAL_COMMUNITY)
Admission: EM | Admit: 2014-04-05 | Discharge: 2014-04-05 | Disposition: A | Discharge status: Home / Self Care | Payer: Medicare PPO | Attending: Emergency Medicine | Admitting: Emergency Medicine

## 2014-04-05 DIAGNOSIS — Z7982 Long term (current) use of aspirin: Secondary | ICD-10-CM | POA: Diagnosis not present

## 2014-04-05 DIAGNOSIS — K5900 Constipation, unspecified: Secondary | ICD-10-CM | POA: Diagnosis present

## 2014-04-05 DIAGNOSIS — Z951 Presence of aortocoronary bypass graft: Secondary | ICD-10-CM | POA: Diagnosis not present

## 2014-04-05 DIAGNOSIS — IMO0002 Reserved for concepts with insufficient information to code with codable children: Secondary | ICD-10-CM | POA: Insufficient documentation

## 2014-04-05 DIAGNOSIS — E7800 Pure hypercholesterolemia, unspecified: Secondary | ICD-10-CM | POA: Insufficient documentation

## 2014-04-05 DIAGNOSIS — Z8742 Personal history of other diseases of the female genital tract: Secondary | ICD-10-CM | POA: Diagnosis not present

## 2014-04-05 DIAGNOSIS — M810 Age-related osteoporosis without current pathological fracture: Secondary | ICD-10-CM | POA: Diagnosis not present

## 2014-04-05 DIAGNOSIS — I10 Essential (primary) hypertension: Secondary | ICD-10-CM | POA: Insufficient documentation

## 2014-04-05 DIAGNOSIS — Z86718 Personal history of other venous thrombosis and embolism: Secondary | ICD-10-CM | POA: Insufficient documentation

## 2014-04-05 DIAGNOSIS — I2510 Atherosclerotic heart disease of native coronary artery without angina pectoris: Secondary | ICD-10-CM | POA: Diagnosis not present

## 2014-04-05 DIAGNOSIS — Z880 Allergy status to penicillin: Secondary | ICD-10-CM | POA: Insufficient documentation

## 2014-04-05 DIAGNOSIS — Z79899 Other long term (current) drug therapy: Secondary | ICD-10-CM | POA: Diagnosis not present

## 2014-04-05 DIAGNOSIS — Z8673 Personal history of transient ischemic attack (TIA), and cerebral infarction without residual deficits: Secondary | ICD-10-CM | POA: Insufficient documentation

## 2014-04-05 DIAGNOSIS — Z8781 Personal history of (healed) traumatic fracture: Secondary | ICD-10-CM | POA: Diagnosis not present

## 2014-04-05 DIAGNOSIS — G40909 Epilepsy, unspecified, not intractable, without status epilepticus: Secondary | ICD-10-CM | POA: Insufficient documentation

## 2014-04-05 DIAGNOSIS — Z91040 Latex allergy status: Secondary | ICD-10-CM | POA: Diagnosis not present

## 2014-04-05 LAB — CBC WITH DIFFERENTIAL/PLATELET
BASOS ABS: 0 10*3/uL (ref 0.0–0.1)
Basophils Relative: 0 % (ref 0–1)
Eosinophils Absolute: 0 10*3/uL (ref 0.0–0.7)
Eosinophils Relative: 1 % (ref 0–5)
HCT: 42.3 % (ref 36.0–46.0)
HEMOGLOBIN: 13.6 g/dL (ref 12.0–15.0)
LYMPHS PCT: 27 % (ref 12–46)
Lymphs Abs: 1.4 10*3/uL (ref 0.7–4.0)
MCH: 28.3 pg (ref 26.0–34.0)
MCHC: 32.2 g/dL (ref 30.0–36.0)
MCV: 88.1 fL (ref 78.0–100.0)
MONOS PCT: 7 % (ref 3–12)
Monocytes Absolute: 0.4 10*3/uL (ref 0.1–1.0)
NEUTROS ABS: 3.4 10*3/uL (ref 1.7–7.7)
Neutrophils Relative %: 65 % (ref 43–77)
Platelets: 174 10*3/uL (ref 150–400)
RBC: 4.8 MIL/uL (ref 3.87–5.11)
RDW: 12.9 % (ref 11.5–15.5)
WBC: 5.2 10*3/uL (ref 4.0–10.5)

## 2014-04-05 LAB — BASIC METABOLIC PANEL
ANION GAP: 9 (ref 5–15)
BUN: 10 mg/dL (ref 6–23)
CALCIUM: 9.3 mg/dL (ref 8.4–10.5)
CO2: 27 meq/L (ref 19–32)
Chloride: 107 mEq/L (ref 96–112)
Creatinine, Ser: 0.77 mg/dL (ref 0.50–1.10)
GFR calc Af Amer: >90 mL/min (ref >90)
GFR calc non Af Amer: 83 mL/min — ABNORMAL LOW (ref >90)
GLUCOSE: 96 mg/dL (ref 70–99)
POTASSIUM: 4.1 meq/L (ref 3.7–5.3)
Sodium: 143 mEq/L (ref 137–147)

## 2014-04-05 MED ORDER — FLEET ENEMA 7-19 GM/118ML RE ENEM
1.0000 | ENEMA | Freq: Once | RECTAL | Status: AC
  Administered 2014-04-05: 1 via RECTAL
  Filled 2014-04-05: qty 1

## 2014-04-05 MED ORDER — SENNOSIDES-DOCUSATE SODIUM 8.6-50 MG PO TABS: 1.0000 | ORAL_TABLET | Freq: Two times a day (BID) | ORAL | Status: DC | PRN

## 2014-04-05 NOTE — Discharge Instructions (Signed)
Please follow with your primary care doctor in the next 2 days for a check-up. They must obtain records for further management.  ° °Do not hesitate to return to the Emergency Department for any new, worsening or concerning symptoms.  ° ° °Constipation °Constipation is when a person: °· Poops (has a bowel movement) less than 3 times a week. °· Has a hard time pooping. °· Has poop that is dry, hard, or bigger than normal. °HOME CARE  °· Eat foods with a lot of fiber in them. This includes fruits, vegetables, beans, and whole grains such as brown rice. °· Avoid fatty foods and foods with a lot of sugar. This includes french fries, hamburgers, cookies, candy, and soda. °· If you are not getting enough fiber from food, take products with added fiber in them (supplements). °· Drink enough fluid to keep your pee (urine) clear or pale yellow. °· Exercise on a regular basis, or as told by your doctor. °· Go to the restroom when you feel like you need to poop. Do not hold it. °· Only take medicine as told by your doctor. Do not take medicines that help you poop (laxatives) without talking to your doctor first. °GET HELP RIGHT AWAY IF:  °· You have bright red blood in your poop (stool). °· Your constipation lasts more than 4 days or gets worse. °· You have belly (abdominal) or butt (rectal) pain. °· You have thin poop (as thin as a pencil). °· You lose weight, and it cannot be explained. °MAKE SURE YOU:  °· Understand these instructions. °· Will watch your condition. °· Will get help right away if you are not doing well or get worse. °Document Released: 02/03/2008 Document Revised: 08/22/2013 Document Reviewed: 05/29/2013 °ExitCare® Patient Information ©2015 [employer]. This information is not intended to replace advice given to you by your health care provider. Make sure you discuss any questions you have with your health care provider. ° °

## 2014-04-05 NOTE — ED Notes (Signed)
Pt reports constipation since this morning, yesterday had small bowel movement but today unable to pass stool. Taking prune juice at home, but not helping.

## 2014-04-05 NOTE — ED Notes (Signed)
Pt returned from radiology.

## 2014-04-05 NOTE — ED Provider Notes (Signed)
CSN: [id number]     Arrival date & time 04/05/14  [id number] History   First MD Initiated Contact with Patient 04/05/14 1004     Chief Complaint  Patient presents with  . Constipation     (Consider location/radiation/quality/duration/timing/severity/associated sxs/prior Treatment) Patient is a 73 y.o. female presenting with constipation.  Constipation   [name] Polesaula [name] is a 73 y.o. female complaining of constipation. She has not had a good bowel movement in a week. She passed several small stool balls yesterday. She's been taking produced at home with little relief. She has a 8/10 left lower quadrant abdominal pain onset this morning. She has not been taking any narcotic pain medications. She's been taking Tylenol with little relief. Patient denies nausea, vomiting, reduced by mouth intake, change in urination, fever, chills. Patient is passing flatus normally, does not have a history of extensive abdominal surgery, no history of small bowel obstruction as per daughter. Patient does have chronic issues with constipation.   Past Medical History  Diagnosis Date  . CAD (coronary artery disease)   . Stroke   . Hypertension   . Hypercholesterolemia   . Peroneal DVT (deep venous thrombosis)   . Osteoporosis   . Seizures   . Post-menopausal   . Heart attack   . Peripheral vascular disease     Left superficial femoral artery  . Hemiparesis and alteration of sensations as late effects of stroke 03/14/2013  . Headache(784.0) 03/14/2013  . Cervical spine fracture     Not requiring surgery   Past Surgical History  Procedure Laterality Date  . Cataract extraction    . Coronary artery bypass graft      x3   Family History  Problem Relation Age of Onset  . Heart attack Mother   . Heart attack Father   . Heart attack Sister   . Heart attack Brother    History  Substance Use Topics  . Smoking status: Never Smoker   . Smokeless tobacco: Never Used  . Alcohol Use: No   OB History   Grav Para  Term Preterm Abortions TAB SAB Ect Mult Living                 Review of Systems  Gastrointestinal: Positive for constipation.    10 systems reviewed and found to be negative, except as noted in the HPI.   Allergies  Latex and Penicillins  Home Medications   Prior to Admission medications   Medication Sig Start Date End Date Taking? Authorizing Provider  acetaminophen (TYLENOL) 500 MG tablet Take 500-1,000 mg by mouth every 8 (eight) hours as needed for mild pain.   Yes Historical Provider, MD  aspirin EC 81 MG tablet Take 1 tablet (81 mg total) by mouth daily. 02/02/14  Yes Ronal FearLynn E Lam, NP  atorvastatin (LIPITOR) 20 MG tablet Take 20 mg by mouth every evening.    Yes Historical Provider, MD  cholecalciferol (VITAMIN D) 1000 UNITS tablet Take 1,000 Units by mouth daily.   Yes Historical Provider, MD  levETIRAcetam (KEPPRA) 500 MG tablet Take 250 mg by mouth every morning. 09/29/13  Yes Glynn OctaveStephen Rancour, MD  lisinopril (PRINIVIL,ZESTRIL) 20 MG tablet Take 20 mg by mouth daily.   Yes Historical Provider, MD  metoprolol succinate (TOPROL-XL) 50 MG 24 hr tablet Take 50 mg by mouth daily. Take with or immediately following a meal.   Yes Historical Provider, MD  senna-docusate (SENOKOT-S) 8.6-50 MG per tablet Take 1 tablet by mouth 2 (two) times daily as  needed for mild constipation. 04/05/14   Bettie Swavely, PA-C   BP 152/67  Pulse 57  Temp(Src) 98.6 F (37 C)  Resp 18  SpO2 100% Physical Exam  Nursing note and vitals reviewed. Constitutional: She is oriented to person, place, and time. She appears well-developed and well-nourished. No distress.  HENT:  Head: Normocephalic.  Eyes: Conjunctivae and EOM are normal.  Cardiovascular: Normal rate.   Pulmonary/Chest: Effort normal and breath sounds normal. No stridor.  Abdominal: Soft. Bowel sounds are normal.  Genitourinary:  Digital rectal exam chaperoned by technician:  Normal rectal tone, normal stool color, there is a significant  impaction in the rectal vault. Approximately one cup of stool removed with extensive stool still remaining.  Musculoskeletal: Normal range of motion.  Neurological: She is alert and oriented to person, place, and time.  Psychiatric: She has a normal mood and affect.    ED Course  Procedures (including critical care time) Labs Review Labs Reviewed  BASIC METABOLIC PANEL - Abnormal; Notable for the following:    GFR calc non Af Amer 83 (*)    All other components within normal limits  CBC WITH DIFFERENTIAL    Imaging Review Dg Abd Acute W/chest  04/05/2014   CLINICAL DATA:  Constipation.  EXAM: ACUTE ABDOMEN SERIES (ABDOMEN 2 VIEW & CHEST 1 VIEW)  COMPARISON:  09/29/2013  FINDINGS: Moderate increased stool in the colon and the rectosigmoid. No obstruction or generalized adynamic ileus. No free air.  Soft tissues show vascular calcifications but are otherwise unremarkable.  IMPRESSION: Moderate increased stool in the colon and rectum. No obstruction. No free air.   Electronically Signed   By: Amie Portland M.D.   On: 04/05/2014 12:07     EKG Interpretation None      MDM   Final diagnoses:  Constipation, unspecified constipation type    Filed Vitals:   04/05/14 1141 04/05/14 1145 04/05/14 1200 04/05/14 1230  BP: 166/66   152/67  Pulse: 59 57 85 57  Temp:      Resp:      SpO2: 100% 99% 100% 100%    Medications  sodium phosphate (FLEET) 7-19 GM/118ML enema 1 enema (1 enema Rectal Given 04/05/14 1126)    [name] [name] is a 73 y.o. female presenting with constipation and fecal impaction. Disimpaction performed however there is still a large amount of stool in the rectal vault. Patient given Fleet enema with significant relief. Blood work an acute abdominal x-ray with no significant abnormalities. Patient will be discharged home with senna.  Evaluation does not show pathology that would require ongoing emergent intervention or inpatient treatment. Pt is hemodynamically stable and  mentating appropriately. Discussed findings and plan with patient/guardian, who agrees with care plan. All questions answered. Return precautions discussed and outpatient follow up given.   Discharge Medication List as of 04/05/2014 12:47 PM    START taking these medications   Details  senna-docusate (SENOKOT-S) 8.6-50 MG per tablet Take 1 tablet by mouth 2 (two) times daily as needed for mild constipation., Starting 04/05/2014, Until Discontinued, [employer], PA-C 04/05/14 1550

## 2014-04-05 NOTE — ED Notes (Signed)
Pt c/o constipation x 3 days, sts she has tried taking miralax and prune juice at home with no relief. Able to have a tiny BM yesterday but still feels like she needs to get more out. C/o pain in rectum. Denies abd pain. Hx of constipation in past. Hx of stroke with left sided weakness. Nad, skin warm and dry, resp e/u.

## 2014-04-05 NOTE — ED Notes (Signed)
Pt reports she is feeling much better after having a large BM.

## 2014-04-05 NOTE — ED Provider Notes (Signed)
Medical screening examination/treatment/procedure(s) were performed by non-physician practitioner and as supervising physician I was immediately available for consultation/collaboration.   EKG Interpretation None        Kolston Lacount, MD 04/05/14 1604 

## 2014-05-11 ENCOUNTER — Telehealth: Payer: Self-pay | Admitting: Nurse Practitioner

## 2014-05-11 MED ORDER — LEVETIRACETAM 250 MG PO TABS: 250.0000 mg | ORAL_TABLET | Freq: Every day | ORAL | Status: DC

## 2014-05-11 NOTE — Telephone Encounter (Signed)
Last OV note says: Offered to try it again with plan to wean off Keppra (as headache may be coming from Keppra) but she declined.  and had been maintained on 250 mg daily for over a year Rx has been sent. I called back.  Got no answer.  Left message.

## 2014-05-11 NOTE — Telephone Encounter (Signed)
Please advise 

## 2014-05-11 NOTE — Telephone Encounter (Signed)
Daughter [name] requesting Rx for levETIRAcetam (KEPPRA) 500 MG tablet.  Please call anytime and may leave detailed message on voicemail.

## 2014-07-18 ENCOUNTER — Encounter: Payer: Self-pay | Admitting: Neurology

## 2014-07-24 ENCOUNTER — Encounter: Payer: Self-pay | Admitting: Neurology

## 2014-08-08 ENCOUNTER — Ambulatory Visit: Payer: Medicare PPO | Admitting: Neurology

## 2014-10-24 ENCOUNTER — Ambulatory Visit: Payer: Medicare PPO | Admitting: Neurology

## 2014-11-02 ENCOUNTER — Other Ambulatory Visit: Payer: Self-pay | Admitting: Nurse Practitioner

## 2014-11-06 ENCOUNTER — Other Ambulatory Visit: Payer: Self-pay | Admitting: *Deleted

## 2014-11-06 MED ORDER — LEVETIRACETAM 250 MG PO TABS: 250.0000 mg | ORAL_TABLET | Freq: Every day | ORAL | Status: DC

## 2015-01-07 ENCOUNTER — Other Ambulatory Visit: Payer: Self-pay

## 2015-01-07 MED ORDER — LEVETIRACETAM 250 MG PO TABS: 250.0000 mg | ORAL_TABLET | Freq: Every day | ORAL | Status: DC

## 2015-02-04 ENCOUNTER — Other Ambulatory Visit: Payer: Self-pay | Admitting: Neurology

## 2015-03-17 ENCOUNTER — Other Ambulatory Visit: Payer: Self-pay | Admitting: Neurology

## 2015-03-22 DIAGNOSIS — I739 Peripheral vascular disease, unspecified: Secondary | ICD-10-CM | POA: Diagnosis not present

## 2015-03-22 DIAGNOSIS — I10 Essential (primary) hypertension: Secondary | ICD-10-CM | POA: Diagnosis not present

## 2015-03-22 DIAGNOSIS — E785 Hyperlipidemia, unspecified: Secondary | ICD-10-CM | POA: Diagnosis not present

## 2015-03-22 DIAGNOSIS — I69959 Hemiplegia and hemiparesis following unspecified cerebrovascular disease affecting unspecified side: Secondary | ICD-10-CM | POA: Diagnosis not present

## 2015-03-22 DIAGNOSIS — I2510 Atherosclerotic heart disease of native coronary artery without angina pectoris: Secondary | ICD-10-CM | POA: Diagnosis not present

## 2015-03-22 DIAGNOSIS — Z1389 Encounter for screening for other disorder: Secondary | ICD-10-CM | POA: Diagnosis not present

## 2015-03-22 DIAGNOSIS — G40409 Other generalized epilepsy and epileptic syndromes, not intractable, without status epilepticus: Secondary | ICD-10-CM | POA: Diagnosis not present

## 2015-03-29 ENCOUNTER — Telehealth: Payer: Self-pay | Admitting: Neurology

## 2015-03-29 MED ORDER — LEVETIRACETAM 250 MG PO TABS: 250.0000 mg | ORAL_TABLET | Freq: Every day | ORAL | Status: DC

## 2015-03-29 NOTE — Telephone Encounter (Signed)
I called the patient's daughter. She can only bring the patient in on a Friday and toward the end of the month. Appointment scheduled for 8/19. She requested Dr. Anne Hahn write for enough Keppra until then.

## 2015-03-29 NOTE — Telephone Encounter (Signed)
Daughter called, Mom's Rx was only for 10. Mom needs medication, without she has seizures. Daughter wanted to schedule appt but availability is too far out. Please call daughter to schedule

## 2015-03-29 NOTE — Telephone Encounter (Signed)
I called the daughter, I will send in a prescription for the Keppra, they have an appointment on 04/18/2015.

## 2015-04-02 ENCOUNTER — Encounter (HOSPITAL_COMMUNITY): Payer: Self-pay | Admitting: Emergency Medicine

## 2015-04-02 ENCOUNTER — Emergency Department (HOSPITAL_COMMUNITY): Payer: Medicare PPO

## 2015-04-02 ENCOUNTER — Emergency Department (HOSPITAL_COMMUNITY)
Admission: EM | Admit: 2015-04-02 | Discharge: 2015-04-02 | Disposition: A | Discharge status: Home / Self Care | Payer: Medicare PPO | Attending: Emergency Medicine | Admitting: Emergency Medicine

## 2015-04-02 DIAGNOSIS — Z91040 Latex allergy status: Secondary | ICD-10-CM | POA: Insufficient documentation

## 2015-04-02 DIAGNOSIS — R569 Unspecified convulsions: Secondary | ICD-10-CM | POA: Diagnosis not present

## 2015-04-02 DIAGNOSIS — G40909 Epilepsy, unspecified, not intractable, without status epilepticus: Secondary | ICD-10-CM | POA: Diagnosis not present

## 2015-04-02 DIAGNOSIS — E780 Pure hypercholesterolemia: Secondary | ICD-10-CM | POA: Diagnosis not present

## 2015-04-02 DIAGNOSIS — Z880 Allergy status to penicillin: Secondary | ICD-10-CM | POA: Diagnosis not present

## 2015-04-02 DIAGNOSIS — Z8673 Personal history of transient ischemic attack (TIA), and cerebral infarction without residual deficits: Secondary | ICD-10-CM | POA: Diagnosis not present

## 2015-04-02 DIAGNOSIS — Z8781 Personal history of (healed) traumatic fracture: Secondary | ICD-10-CM | POA: Insufficient documentation

## 2015-04-02 DIAGNOSIS — Z86718 Personal history of other venous thrombosis and embolism: Secondary | ICD-10-CM | POA: Insufficient documentation

## 2015-04-02 DIAGNOSIS — I10 Essential (primary) hypertension: Secondary | ICD-10-CM | POA: Insufficient documentation

## 2015-04-02 DIAGNOSIS — Z79899 Other long term (current) drug therapy: Secondary | ICD-10-CM | POA: Diagnosis not present

## 2015-04-02 DIAGNOSIS — Z951 Presence of aortocoronary bypass graft: Secondary | ICD-10-CM | POA: Diagnosis not present

## 2015-04-02 DIAGNOSIS — R110 Nausea: Secondary | ICD-10-CM | POA: Diagnosis not present

## 2015-04-02 DIAGNOSIS — I2510 Atherosclerotic heart disease of native coronary artery without angina pectoris: Secondary | ICD-10-CM | POA: Insufficient documentation

## 2015-04-02 DIAGNOSIS — Z7982 Long term (current) use of aspirin: Secondary | ICD-10-CM | POA: Diagnosis not present

## 2015-04-02 LAB — BASIC METABOLIC PANEL
ANION GAP: 10 (ref 5–15)
BUN: 11 mg/dL (ref 6–20)
CHLORIDE: 106 mmol/L (ref 101–111)
CO2: 23 mmol/L (ref 22–32)
Calcium: 9.1 mg/dL (ref 8.9–10.3)
Creatinine, Ser: 0.83 mg/dL (ref 0.44–1.00)
GFR calc Af Amer: >60 mL/min (ref >60)
Glucose, Bld: 101 mg/dL — ABNORMAL HIGH (ref 65–99)
Potassium: 3.7 mmol/L (ref 3.5–5.1)
Sodium: 139 mmol/L (ref 135–145)

## 2015-04-02 LAB — URINALYSIS, ROUTINE W REFLEX MICROSCOPIC
Bilirubin Urine: NEGATIVE
Glucose, UA: NEGATIVE mg/dL
KETONES UR: NEGATIVE mg/dL
Leukocytes, UA: NEGATIVE
Nitrite: NEGATIVE
Protein, ur: 30 mg/dL — AB
SPECIFIC GRAVITY, URINE: 1.011 (ref 1.005–1.030)
Urobilinogen, UA: 1 mg/dL (ref 0.0–1.0)
pH: 6.5 (ref 5.0–8.0)

## 2015-04-02 LAB — I-STAT TROPONIN, ED: Troponin i, poc: 0.01 ng/mL (ref 0.00–0.08)

## 2015-04-02 LAB — URINE MICROSCOPIC-ADD ON

## 2015-04-02 LAB — CBC
HCT: 42.2 % (ref 36.0–46.0)
HEMOGLOBIN: 13.4 g/dL (ref 12.0–15.0)
MCH: 28.3 pg (ref 26.0–34.0)
MCHC: 31.8 g/dL (ref 30.0–36.0)
MCV: 89 fL (ref 78.0–100.0)
Platelets: 154 10*3/uL (ref 150–400)
RBC: 4.74 MIL/uL (ref 3.87–5.11)
RDW: 13 % (ref 11.5–15.5)
WBC: 4.8 10*3/uL (ref 4.0–10.5)

## 2015-04-02 LAB — CBG MONITORING, ED: Glucose-Capillary: 90 mg/dL (ref 65–99)

## 2015-04-02 NOTE — ED Notes (Signed)
Pt from home via [facility] with reports of grand mal seizure lasting 2-3 minutes while laying in bed per daughter.  Pt has hx of the same, hx CVA with left sided deficits.  On EMS arrival pt was postictal, alert but non verbal, pt oriented x2 with EMS.  Denies CP, SOB, HA, reports nausea, given 4 mg Zofran with relief.  Pt in NAD, A&Ox4.

## 2015-04-02 NOTE — ED Provider Notes (Signed)
CSN: [id number]     Arrival date & time 04/02/15  0809 History   First MD Initiated Contact with Patient 04/02/15 [phone]     Chief Complaint  Patient presents with  . Seizures     (Consider location/radiation/quality/duration/timing/severity/associated sxs/prior Treatment) HPI Comments: 74 year old female with a past medical history seizures, CAD, hypertension, DVT, MI, PVD and stroke with left-sided deficit presenting via EMS from home after a witnessed seizure by her daughter. Patient was laying in bed, states she felt nauseous prior to the seizure. Seizure reported to be grand mal, lasting 2-3 minutes. On EMS arrival, patient was post ictal, alert but nonverbal, somnolent. Return to baseline on arrival to ED. EMS gave 4 mg Zofran for nausea with relief. Patient states she has been feeling fine. No recent head injuries. No fever, chills, chest pain, shortness breath, headache, lightheadedness, dizziness, increased extremity numbness, tingling or weakness. Reports being compliant with all her medications until her medications this morning prior to the seizure. Last seizure was August 2015.  Patient is a 74 y.o. female presenting with seizures. The history is provided by the patient.  Seizures Seizure activity on arrival: no   Seizure type:  Grand mal Preceding symptoms: nausea   Initial focality:  Unable to specify Postictal symptoms: somnolence   Return to baseline: yes   Severity:  Mild Duration: 2-3 minutes. Timing:  Once Number of seizures this episode:  1 Progression:  Resolved Recent head injury:  No recent head injuries Meds prior to arrival: zofran. History of seizures: yes     Past Medical History  Diagnosis Date  . CAD (coronary artery disease)   . Stroke   . Hypertension   . Hypercholesterolemia   . Peroneal DVT (deep venous thrombosis)   . Osteoporosis   . Seizures   . Post-menopausal   . Heart attack   . Peripheral vascular disease     Left superficial femoral  artery  . Hemiparesis and alteration of sensations as late effects of stroke 03/14/2013  . Headache(784.0) 03/14/2013  . Cervical spine fracture     Not requiring surgery   Past Surgical History  Procedure Laterality Date  . Cataract extraction    . Coronary artery bypass graft      x3   Family History  Problem Relation Age of Onset  . Heart attack Mother   . Heart attack Father   . Heart attack Sister   . Heart attack Brother    History  Substance Use Topics  . Smoking status: Never Smoker   . Smokeless tobacco: Never Used  . Alcohol Use: No   OB History    No data available     Review of Systems  Gastrointestinal: Positive for nausea.  Neurological: Positive for seizures.  All other systems reviewed and are negative.     Allergies  Latex and Penicillins  Home Medications   Prior to Admission medications   Medication Sig Start Date End Date Taking? Authorizing Provider  acetaminophen (TYLENOL) 500 MG tablet Take 500-1,000 mg by mouth every 8 (eight) hours as needed for mild pain.   Yes Historical Provider, MD  aspirin EC 81 MG tablet Take 1 tablet (81 mg total) by mouth daily. 02/02/14  Yes Ronal Fear, NP  atorvastatin (LIPITOR) 20 MG tablet Take 20 mg by mouth every evening.    Yes Historical Provider, MD  cholecalciferol (VITAMIN D) 1000 UNITS tablet Take 1,000 Units by mouth daily.   Yes Historical Provider, MD  levETIRAcetam (KEPPRA)  250 MG tablet Take 1 tablet (250 mg total) by mouth daily. 03/29/15  Yes York Spaniel, MD  lisinopril (PRINIVIL,ZESTRIL) 20 MG tablet Take 20 mg by mouth daily.   Yes Historical Provider, MD  metoprolol succinate (TOPROL-XL) 50 MG 24 hr tablet Take 50 mg by mouth daily. Take with or immediately following a meal.   Yes Historical Provider, MD  senna-docusate (SENOKOT-S) 8.6-50 MG per tablet Take 1 tablet by mouth 2 (two) times daily as needed for mild constipation. 04/05/14  Yes Nicole Pisciotta, PA-C   BP 189/85 mmHg  Pulse 75   Temp(Src) 97.9 F (36.6 C) (Oral)  Resp 20  SpO2 100% Physical Exam  Constitutional: She is oriented to person, place, and time. She appears well-developed and well-nourished. No distress.  HENT:  Head: Normocephalic and atraumatic.  Mouth/Throat: Oropharynx is clear and moist.  Eyes: Conjunctivae and EOM are normal. Pupils are equal, round, and reactive to light.  Neck: Normal range of motion. Neck supple.  Cardiovascular: Normal rate, regular rhythm and normal heart sounds.   Pulmonary/Chest: Effort normal and breath sounds normal. No respiratory distress.  Musculoskeletal: Normal range of motion. She exhibits no edema.  Neurological: She is alert and oriented to person, place, and time. She displays no seizure activity. GCS eye subscore is 4. GCS verbal subscore is 5. GCS motor subscore is 6.  Strength LUE 0/5, RUE 5/5, LLE 2/5, RLE 5/5 (normal per pt). Decreased sensation LUE/LLE compared to right, normal per pt.  Skin: Skin is warm and dry.  Psychiatric: She has a normal mood and affect. Her behavior is normal.  Nursing note and vitals reviewed.   ED Course  Procedures (including critical care time) Labs Review Labs Reviewed  BASIC METABOLIC PANEL - Abnormal; Notable for the following:    Glucose, Bld 101 (*)    All other components within normal limits  URINALYSIS, ROUTINE W REFLEX MICROSCOPIC (NOT AT [facility]) - Abnormal; Notable for the following:    APPearance HAZY (*)    Hgb urine dipstick TRACE (*)    Protein, ur 30 (*)    All other components within normal limits  URINE MICROSCOPIC-ADD ON - Abnormal; Notable for the following:    Squamous Epithelial / LPF MANY (*)    Bacteria, UA FEW (*)    All other components within normal limits  URINE CULTURE  CBC  CBG MONITORING, ED  Rosezena Sensor, ED    Imaging Review Dg Chest 1 View  04/02/2015   CLINICAL DATA:  Seizure  EXAM: CHEST  1 VIEW  COMPARISON:  04/05/2014  FINDINGS: Postop CABG. Heart size normal. Vascularity  normal. Lungs are clear without infiltrate effusion or mass.  IMPRESSION: No active disease.   Electronically Signed   By: Marlan Palau M.D.   On: 04/02/2015 09:07     EKG Interpretation   Date/Time:  Tuesday April 02 2015 08:46:46 EDT Ventricular Rate:  73 PR Interval:  170 QRS Duration: 88 QT Interval:  410 QTC Calculation: 451 R Axis:   26 Text Interpretation:  Normal sinus rhythm Moderate voltage criteria for  LVH, may be normal variant Cannot rule out Septal infarct , age  undetermined ST \\T \ T wave abnormality, consider inferior ischemia ST \\T \  T wave abnormality, consider anterolateral ischemia Abnormal ECG No prior  for comparison Confirmed by Gwendolyn Grant  MD, BLAIR ([id number]) on 04/02/2015 9:10:38  AM      MDM   Final diagnoses:  Seizure   Non-toxic appearing, NAD. AFVSS. No  seizure activity on arrival. At baseline. No new deficits. Workup negative. Compliant with medication. Has not had a seizure in a year. Daughter present in room and discussing results. Patient remains alert and oriented throughout encounter. Stable for discharge. Follow-up with PCP within 1 week. Return precautions given. Patient states understanding of treatment care plan and is agreeable.  Discussed with attending Dr. Gwendolyn Grant who also evaluated patient and agrees with plan of care.  Kathrynn Speed, PA-C 04/02/15 1129  Elwin Mocha, MD 04/02/15 [phone]

## 2015-04-02 NOTE — Discharge Instructions (Signed)

## 2015-04-03 LAB — URINE CULTURE

## 2015-04-19 ENCOUNTER — Encounter: Payer: Self-pay | Admitting: Neurology

## 2015-04-19 ENCOUNTER — Ambulatory Visit (INDEPENDENT_AMBULATORY_CARE_PROVIDER_SITE_OTHER): Payer: Medicare PPO | Admitting: Neurology

## 2015-04-19 VITALS — BP 176/73 | HR 54 | Ht 69.0 in

## 2015-04-19 DIAGNOSIS — I69398 Other sequelae of cerebral infarction: Secondary | ICD-10-CM | POA: Diagnosis not present

## 2015-04-19 DIAGNOSIS — G40309 Generalized idiopathic epilepsy and epileptic syndromes, not intractable, without status epilepticus: Secondary | ICD-10-CM

## 2015-04-19 DIAGNOSIS — I69359 Hemiplegia and hemiparesis following cerebral infarction affecting unspecified side: Secondary | ICD-10-CM | POA: Diagnosis not present

## 2015-04-19 MED ORDER — LEVETIRACETAM 250 MG PO TABS: 250.0000 mg | ORAL_TABLET | Freq: Every day | ORAL | Status: DC

## 2015-04-19 MED ORDER — LAMOTRIGINE 25 MG PO TABS: ORAL_TABLET | ORAL | Status: DC

## 2015-04-19 NOTE — Patient Instructions (Addendum)
We will start lamictal 25 mg and work up to 3 tablets twice a day, if she is doing well on this dose for 2 weeks, call our office and I will convert to the 100 mg twice a day and taper off of Keppra.    Epilepsy Epilepsy is a disorder in which a person has repeated seizures over time. A seizure is a release of abnormal electrical activity in the brain. Seizures can cause a change in attention, behavior, or the ability to remain awake and alert (altered mental status). Seizures often involve uncontrollable shaking (convulsions).  Most people with epilepsy lead normal lives. However, people with epilepsy are at an increased risk of falls, accidents, and injuries. Therefore, it is important to begin treatment right away. CAUSES  Epilepsy has many possible causes. Anything that disturbs the normal pattern of brain cell activity can lead to seizures. This may include:   Head injury.  Birth trauma.  High fever as a child.  Stroke.  Bleeding into or around the brain.  Certain drugs.  Prolonged low oxygen, such as what occurs after CPR efforts.  Abnormal brain development.  Certain illnesses, such as meningitis, encephalitis (brain infection), malaria, and other infections.  An imbalance of nerve signaling chemicals (neurotransmitters).  SIGNS AND SYMPTOMS  The symptoms of a seizure can vary greatly from one person to another. Right before a seizure, you may have a warning (aura) that a seizure is about to occur. An aura may include the following symptoms:  Fear or anxiety.  Nausea.  Feeling like the room is spinning (vertigo).  Vision changes, such as seeing flashing lights or spots. Common symptoms during a seizure include:  Abnormal sensations, such as an abnormal smell or a bitter taste in the mouth.   Sudden, general body stiffness.   Convulsions that involve rhythmic jerking of the face, arm, or leg on one or both sides.   Sudden change in consciousness.    Appearing to be awake but not responding.   Appearing to be asleep but cannot be awakened.   Grimacing, chewing, lip smacking, drooling, tongue biting, or loss of bowel or bladder control. After a seizure, you may feel sleepy for a while. DIAGNOSIS  Your health care provider will ask about your symptoms and take a medical history. Descriptions from any witnesses to your seizures will be very helpful in the diagnosis. A physical exam, including a detailed neurological exam, is necessary. Various tests may be done, such as:   An electroencephalogram (EEG). This is a painless test of your brain waves. In this test, a diagram is created of your brain waves. These diagrams can be interpreted by a specialist.  An MRI of the brain.   A CT scan of the brain.   A spinal tap (lumbar puncture, LP).  Blood tests to check for signs of infection or abnormal blood chemistry. TREATMENT  There is no cure for epilepsy, but it is generally treatable. Once epilepsy is diagnosed, it is important to begin treatment as soon as possible. For most people with epilepsy, seizures can be controlled with medicines. The following may also be used:  A pacemaker for the brain (vagus nerve stimulator) can be used for people with seizures that are not well controlled by medicine.  Surgery on the brain. For some people, epilepsy eventually goes away. HOME CARE INSTRUCTIONS   Follow your health care provider's recommendations on driving and safety in normal activities.  Get enough rest. Lack of sleep can cause  seizures.  Only take over-the-counter or prescription medicines as directed by your health care provider. Take any prescribed medicine exactly as directed.  Avoid any known triggers of your seizures.  Keep a seizure diary. Record what you recall about any seizure, especially any possible trigger.   Make sure the people you live and work with know that you are prone to seizures. They should receive  instructions on how to help you. In general, a witness to a seizure should:   Cushion your head and body.   Turn you on your side.   Avoid unnecessarily restraining you.   Not place anything inside your mouth.   Call for emergency medical help if there is any question about what has occurred.   Follow up with your health care provider as directed. You may need regular blood tests to monitor the levels of your medicine.  SEEK MEDICAL CARE IF:   You develop signs of infection or other illness. This might increase the risk of a seizure.   You seem to be having more frequent seizures.   Your seizure pattern is changing.  SEEK IMMEDIATE MEDICAL CARE IF:   You have a seizure that does not stop after a few moments.   You have a seizure that causes any difficulty in breathing.   You have a seizure that results in a very severe headache.   You have a seizure that leaves you with the inability to speak or use a part of your body.  Document Released: 08/17/2005 Document Revised: 06/07/2013 Document Reviewed: 03/29/2013 [facility] Patient Information 2015 [place], [place]. This information is not intended to replace advice given to you by your health care provider. Make sure you discuss any questions you have with your health care provider.

## 2015-04-19 NOTE — Progress Notes (Signed)
Reason for visit: seizures  [name] [name] is an 74 y.o. female  History of present illness:  [name] [name] is a 74 year old right-handed black female with a history of cerebrovascular disease with a right brain stroke and a significant left hemiparesis. The patient has had intermittent seizures, the last seizure was approximately one year ago, but the patient was in the emergency room on 04/02/2015 with a recurrent seizure. The patient had a brief warning that the seizure was going to occur, and then went into a generalized event. The patient has been on Keppra taking 250 mg daily, she indicates that she has not tolerated higher doses secondary to increased headaches. The patient does not operate a motor vehicle. She has not noted any new symptoms of numbness or weakness on the face, arms, or legs. She does have intermittent headaches. She returns to this office for an evaluation.  Past Medical History  Diagnosis Date  . CAD (coronary artery disease)   . Stroke   . Hypertension   . Hypercholesterolemia   . Peroneal DVT (deep venous thrombosis)   . Osteoporosis   . Seizures   . Post-menopausal   . Heart attack   . Peripheral vascular disease     Left superficial femoral artery  . Hemiparesis and alteration of sensations as late effects of stroke 03/14/2013  . Headache(784.0) 03/14/2013  . Cervical spine fracture     Not requiring surgery    Past Surgical History  Procedure Laterality Date  . Cataract extraction    . Coronary artery bypass graft      x3    Family History  Problem Relation Age of Onset  . Heart attack Mother   . Heart attack Father   . Heart attack Sister   . Heart attack Brother     Social history:  reports that she has never smoked. She has never used smokeless tobacco. She reports that she does not drink alcohol or use illicit drugs.    Allergies  Allergen Reactions  . Latex Rash  . Penicillins Rash    Hives    Medications:  Prior to Admission  medications   Medication Sig Start Date End Date Taking? Authorizing Provider  acetaminophen (TYLENOL) 500 MG tablet Take 500-1,000 mg by mouth every 8 (eight) hours as needed for mild pain.   Yes Historical Provider, MD  aspirin EC 81 MG tablet Take 1 tablet (81 mg total) by mouth daily. 02/02/14  Yes Ronal Fear, NP  atorvastatin (LIPITOR) 20 MG tablet Take 20 mg by mouth every evening.    Yes Historical Provider, MD  cholecalciferol (VITAMIN D) 1000 UNITS tablet Take 1,000 Units by mouth daily.   Yes Historical Provider, MD  clopidogrel (PLAVIX) 75 MG tablet Take 75 mg by mouth daily. 03/18/15  Yes Historical Provider, MD  levETIRAcetam (KEPPRA) 250 MG tablet Take 1 tablet (250 mg total) by mouth daily. 04/19/15  Yes York Spaniel, MD  lisinopril (PRINIVIL,ZESTRIL) 20 MG tablet Take 20 mg by mouth daily.   Yes Historical Provider, MD  metoprolol succinate (TOPROL-XL) 50 MG 24 hr tablet Take 50 mg by mouth daily. Take with or immediately following a meal.   Yes Historical Provider, MD  lamoTRIgine (LAMICTAL) 25 MG tablet 1 tablet twice a day for 2 weeks, then 2 tablets twice a day for 2 weeks, then take 3 tablets twice a day 04/19/15   York Spaniel, MD    ROS:  Out of a complete 14  system review of symptoms, the patient complains only of the following symptoms, and all other reviewed systems are negative.  Seizures Weakness Gait disturbance  Blood pressure 176/73, pulse 54, height  (1.753 m).  Physical Exam  General: The patient is alert and cooperative at the time of the examination.  Skin: No significant peripheral edema is noted.   Neurologic Exam  Mental status: The patient is alert and oriented x 3 at the time of the examination. The patient has apparent normal recent and remote memory, with an apparently normal attention span and concentration ability.   Cranial nerves: Facial symmetry is present. Speech is normal, no aphasia or dysarthria is noted. Extraocular  movements are full. Visual fields are full, with exception of a mild left inferior quadrantopsia.  Motor: The patient has good strength in the right extremities. On the left, the patient has significant weakness of the left upper extremity, minimal grip, minimal flexion-extension of the elbow. The patient is unable to raise the left arm above the head. The patient has 4 minus or 3/5 strength with hip flexion and knee extension, the patient wears a brace on the left ankle and foot.  Sensory examination: Soft touch sensation is decreased on the left face, arm, and leg relative to the right.  Coordination: The patient has the ability to perform finger-nose-finger and heel-to-shin on the right, unable to perform on the left..  Gait and station: The patient is able to stand with assistance. She is able to take a few steps with assistance, unable to walk independently.  Reflexes: Deep tendon reflexes are symmetric.   Assessment/Plan:  1. Right brain stroke, left hemiparesis  2. History of seizures with recent recurrence  The patient is on a very low dose of Keppra, she has been unable to tolerate higher doses. I will start patient on Lamictal working up by 50 mg every 2 weeks until she gets to 75 mg twice daily. At that time, the family will call me, I will call in a prescription for the 100 mg tablets taking 1 twice a day. Eventually, we will taper her off of the Keppra. She will follow-up in 6 months. The patient does not operate a motor vehicle.  Marlan Palau MD 04/21/2015 3:09 PM  [facility] [address] [place], [place] [address]  Phone [phone] Fax [phone]

## 2015-05-27 ENCOUNTER — Telehealth: Payer: Self-pay | Admitting: Neurology

## 2015-05-27 MED ORDER — LAMOTRIGINE 100 MG PO TABS: 100.0000 mg | ORAL_TABLET | Freq: Two times a day (BID) | ORAL | Status: DC

## 2015-05-27 NOTE — Telephone Encounter (Signed)
I called the patient's daughter. The patient is now taking Lamictal 75 mg twice daily. According to Dr. Anne Hahn' note, the patient will then taper off Keppra to 100 mg twice daily. I advised that Dr. Anne Hahn is not in the office today but I would forward this message to him to place the order tomorrow.

## 2015-05-27 NOTE — Telephone Encounter (Signed)
Daughter [name] [phone] called regarding levETIRAcetam (KEPPRA) 250 MG tablet. Dr. Anne Hahn changed her medication to new medication tapering off from Keppra. Does Dr. Anne Hahn want her to get another refill of Kepra or begin the new medication? Has enough until Wednesday. Is aware Dr. Anne Hahn is not in the office today.

## 2015-05-27 NOTE — Telephone Encounter (Signed)
I called the daughter. The patient is now on Lamictal 75 mg twice daily for 2 weeks, she will go to the 100 mg tablet taking one twice daily, she will stop the Keppra.

## 2015-07-19 DIAGNOSIS — H04123 Dry eye syndrome of bilateral lacrimal glands: Secondary | ICD-10-CM | POA: Diagnosis not present

## 2015-07-19 DIAGNOSIS — H5340 Unspecified visual field defects: Secondary | ICD-10-CM | POA: Diagnosis not present

## 2015-07-19 DIAGNOSIS — H43391 Other vitreous opacities, right eye: Secondary | ICD-10-CM | POA: Diagnosis not present

## 2015-08-23 ENCOUNTER — Emergency Department (HOSPITAL_COMMUNITY)
Admission: EM | Admit: 2015-08-23 | Discharge: 2015-08-23 | Disposition: A | Discharge status: Home / Self Care | Payer: Medicare PPO | Attending: Emergency Medicine | Admitting: Emergency Medicine

## 2015-08-23 ENCOUNTER — Emergency Department (HOSPITAL_COMMUNITY): Payer: Medicare PPO

## 2015-08-23 ENCOUNTER — Encounter (HOSPITAL_COMMUNITY): Payer: Self-pay | Admitting: *Deleted

## 2015-08-23 DIAGNOSIS — Z79899 Other long term (current) drug therapy: Secondary | ICD-10-CM | POA: Diagnosis not present

## 2015-08-23 DIAGNOSIS — Z86718 Personal history of other venous thrombosis and embolism: Secondary | ICD-10-CM | POA: Diagnosis not present

## 2015-08-23 DIAGNOSIS — M25552 Pain in left hip: Secondary | ICD-10-CM | POA: Diagnosis not present

## 2015-08-23 DIAGNOSIS — Z7902 Long term (current) use of antithrombotics/antiplatelets: Secondary | ICD-10-CM | POA: Diagnosis not present

## 2015-08-23 DIAGNOSIS — Z880 Allergy status to penicillin: Secondary | ICD-10-CM | POA: Diagnosis not present

## 2015-08-23 DIAGNOSIS — Z8673 Personal history of transient ischemic attack (TIA), and cerebral infarction without residual deficits: Secondary | ICD-10-CM | POA: Insufficient documentation

## 2015-08-23 DIAGNOSIS — Z7982 Long term (current) use of aspirin: Secondary | ICD-10-CM | POA: Insufficient documentation

## 2015-08-23 DIAGNOSIS — Z780 Asymptomatic menopausal state: Secondary | ICD-10-CM | POA: Insufficient documentation

## 2015-08-23 DIAGNOSIS — Z951 Presence of aortocoronary bypass graft: Secondary | ICD-10-CM | POA: Insufficient documentation

## 2015-08-23 DIAGNOSIS — W1839XA Other fall on same level, initial encounter: Secondary | ICD-10-CM | POA: Insufficient documentation

## 2015-08-23 DIAGNOSIS — Z8739 Personal history of other diseases of the musculoskeletal system and connective tissue: Secondary | ICD-10-CM | POA: Diagnosis not present

## 2015-08-23 DIAGNOSIS — S7002XA Contusion of left hip, initial encounter: Secondary | ICD-10-CM | POA: Diagnosis not present

## 2015-08-23 DIAGNOSIS — Z91040 Latex allergy status: Secondary | ICD-10-CM | POA: Insufficient documentation

## 2015-08-23 DIAGNOSIS — Y998 Other external cause status: Secondary | ICD-10-CM | POA: Diagnosis not present

## 2015-08-23 DIAGNOSIS — E7800 Pure hypercholesterolemia, unspecified: Secondary | ICD-10-CM | POA: Diagnosis not present

## 2015-08-23 DIAGNOSIS — I10 Essential (primary) hypertension: Secondary | ICD-10-CM | POA: Diagnosis not present

## 2015-08-23 DIAGNOSIS — Y9389 Activity, other specified: Secondary | ICD-10-CM | POA: Diagnosis not present

## 2015-08-23 DIAGNOSIS — I252 Old myocardial infarction: Secondary | ICD-10-CM | POA: Insufficient documentation

## 2015-08-23 DIAGNOSIS — S79912A Unspecified injury of left hip, initial encounter: Secondary | ICD-10-CM | POA: Diagnosis not present

## 2015-08-23 DIAGNOSIS — Y9289 Other specified places as the place of occurrence of the external cause: Secondary | ICD-10-CM | POA: Diagnosis not present

## 2015-08-23 NOTE — ED Provider Notes (Signed)
CSN: [id number]     Arrival date & time 08/23/15  1447 History   First MD Initiated Contact with Patient 08/23/15 1455     Chief Complaint  Patient presents with  . Fall  . Hip Pain     Patient is a 74 y.o. female presenting with fall and hip pain. The history is provided by the patient and a relative.  No language interpreter was used.  Fall  Hip Pain   [name] [name] is a 74 y.o. female who presents to the Emergency Department complaining of left hip pain. She experienced a mechanical fall 2 nights ago. She had no pain at the time. Since that time her daughter has noticed increased swelling over the hip and the patient now reports some pain with weightbearing and movement of the left leg. She has a history of CVA and takes aspirin and Plavix. She has decreased strength, movement, and sensation in the left arm and left leg at baseline. She walks with a cane and lives with her daughter. Symptoms are moderate and constant nature.   Past Medical History  Diagnosis Date  . CAD (coronary artery disease)   . Stroke (HCC)   . Hypertension   . Hypercholesterolemia   . Peroneal DVT (deep venous thrombosis) (HCC)   . Osteoporosis   . Seizures (HCC)   . Post-menopausal   . Heart attack (HCC)   . Peripheral vascular disease (HCC)     Left superficial femoral artery  . Hemiparesis and alteration of sensations as late effects of stroke (HCC) 03/14/2013  . Headache(784.0) 03/14/2013  . Cervical spine fracture [facility])     Not requiring surgery   Past Surgical History  Procedure Laterality Date  . Cataract extraction    . Coronary artery bypass graft      x3   Family History  Problem Relation Age of Onset  . Heart attack Mother   . Heart attack Father   . Heart attack Sister   . Heart attack Brother    Social History  Substance Use Topics  . Smoking status: Never Smoker   . Smokeless tobacco: Never Used  . Alcohol Use: No   OB History    No data available     Review of Systems   All other systems reviewed and are negative.     Allergies  Latex; Penicillins; and Tape  Home Medications   Prior to Admission medications   Medication Sig Start Date End Date Taking? Authorizing Provider  acetaminophen (TYLENOL) 500 MG tablet Take 500-1,000 mg by mouth every 8 (eight) hours as needed for mild pain.    Historical Provider, MD  aspirin EC 81 MG tablet Take 1 tablet (81 mg total) by mouth daily. 02/02/14   Ronal Fear, NP  atorvastatin (LIPITOR) 20 MG tablet Take 20 mg by mouth every evening.     Historical Provider, MD  cholecalciferol (VITAMIN D) 1000 UNITS tablet Take 1,000 Units by mouth daily.    Historical Provider, MD  clopidogrel (PLAVIX) 75 MG tablet Take 75 mg by mouth daily. 03/18/15   Historical Provider, MD  lamoTRIgine (LAMICTAL) 100 MG tablet Take 1 tablet (100 mg total) by mouth 2 (two) times daily. 05/27/15   York Spaniel, MD  lisinopril (PRINIVIL,ZESTRIL) 20 MG tablet Take 20 mg by mouth daily.    Historical Provider, MD  metoprolol succinate (TOPROL-XL) 50 MG 24 hr tablet Take 50 mg by mouth daily. Take with or immediately following a meal.  Historical Provider, MD   BP 161/57 mmHg  Pulse 59  Temp(Src) 98.5 F (36.9 C) (Oral)  Resp 18  SpO2 99% Physical Exam  Constitutional: She is oriented to person, place, and time. She appears well-developed and well-nourished.  HENT:  Head: Normocephalic and atraumatic.  Cardiovascular: Normal rate and regular rhythm.   No murmur heard. Pulmonary/Chest: Effort normal and breath sounds normal. No respiratory distress.  Abdominal: Soft. There is no tenderness. There is no rebound and no guarding.  Musculoskeletal:  Ecchymosis and swelling over the left lateral posterior hip. DP pulses bilaterally.  There is pain with passive flexion of the left hip.  Neurological: She is alert and oriented to person, place, and time.  Left upper extremity and left lower extremity weakness.  Skin: Skin is warm and dry.   Psychiatric: She has a normal mood and affect. Her behavior is normal.  Nursing note and vitals reviewed.   ED Course  Procedures (including critical care time) Labs Review Labs Reviewed - No data to display  Imaging Review Dg Hip Unilat With Pelvis 2-3 Views Left  08/23/2015  CLINICAL DATA:  Larey SeatFell on Wednesday night, LEFT hip pain since, LEFT side weakness due to stroke, history osteoporosis EXAM: DG HIP (WITH OR WITHOUT PELVIS) 2-3V LEFT COMPARISON:  None FINDINGS: Narrowing of both hip joints. Slight rotation to the LEFT with LEFT SI joint suboptimally profiled. RIGHT SI joint preserved. No acute fracture, dislocation, or bone destruction. Scattered atherosclerotic calcifications. Soft tissue swelling lateral to proximal LEFT femur. IMPRESSION: No definite acute osseous abnormalities. Electronically Signed   By: Ulyses SouthwardMark  Boles M.D.   On: 08/23/2015 16:01   I have personally reviewed and evaluated these images and lab results as part of my medical decision-making.   EKG Interpretation None      MDM   Final diagnoses:  Contusion, hip, left, initial encounter    Pt here for evaluation of hip pain following a mechanical fall.  No evidence of acute fracture on imaging, pt able to bear weight and ambulate at baseline (has limp due to prior stroke).  Plan to d/c home with outpatient follow up.      Tilden FossaElizabeth Fox Salminen, MD 08/24/15 0120

## 2015-08-23 NOTE — Discharge Instructions (Signed)

## 2015-08-23 NOTE — ED Notes (Signed)
Pt verbalized understanding of d/c instructionsand follow-up care. No further questions/concerns, VSS, assisted to lobby in wheelchair. 

## 2015-08-23 NOTE — ED Notes (Signed)
Pt reports falling on wed night, having left hip pain since.

## 2015-09-23 ENCOUNTER — Other Ambulatory Visit: Payer: Self-pay | Admitting: Neurology

## 2015-10-21 ENCOUNTER — Ambulatory Visit: Payer: Self-pay | Admitting: Nurse Practitioner

## 2015-10-22 DIAGNOSIS — I739 Peripheral vascular disease, unspecified: Secondary | ICD-10-CM | POA: Diagnosis not present

## 2015-10-22 DIAGNOSIS — G40409 Other generalized epilepsy and epileptic syndromes, not intractable, without status epilepticus: Secondary | ICD-10-CM | POA: Diagnosis not present

## 2015-10-22 DIAGNOSIS — E785 Hyperlipidemia, unspecified: Secondary | ICD-10-CM | POA: Diagnosis not present

## 2015-10-22 DIAGNOSIS — I10 Essential (primary) hypertension: Secondary | ICD-10-CM | POA: Diagnosis not present

## 2015-10-22 DIAGNOSIS — I25118 Atherosclerotic heart disease of native coronary artery with other forms of angina pectoris: Secondary | ICD-10-CM | POA: Diagnosis not present

## 2015-10-22 DIAGNOSIS — Z1389 Encounter for screening for other disorder: Secondary | ICD-10-CM | POA: Diagnosis not present

## 2015-10-22 DIAGNOSIS — K5900 Constipation, unspecified: Secondary | ICD-10-CM | POA: Diagnosis not present

## 2015-10-22 DIAGNOSIS — E441 Mild protein-calorie malnutrition: Secondary | ICD-10-CM | POA: Diagnosis not present

## 2015-10-22 DIAGNOSIS — I69959 Hemiplegia and hemiparesis following unspecified cerebrovascular disease affecting unspecified side: Secondary | ICD-10-CM | POA: Diagnosis not present

## 2015-10-24 ENCOUNTER — Other Ambulatory Visit: Payer: Self-pay | Admitting: Neurology

## 2015-11-15 ENCOUNTER — Encounter: Payer: Self-pay | Admitting: Nurse Practitioner

## 2015-11-15 ENCOUNTER — Ambulatory Visit (INDEPENDENT_AMBULATORY_CARE_PROVIDER_SITE_OTHER): Payer: Medicare PPO | Admitting: Nurse Practitioner

## 2015-11-15 VITALS — BP 162/75 | HR 61 | Ht 69.0 in

## 2015-11-15 DIAGNOSIS — G40309 Generalized idiopathic epilepsy and epileptic syndromes, not intractable, without status epilepticus: Secondary | ICD-10-CM | POA: Diagnosis not present

## 2015-11-15 DIAGNOSIS — I69398 Other sequelae of cerebral infarction: Secondary | ICD-10-CM | POA: Diagnosis not present

## 2015-11-15 DIAGNOSIS — I69359 Hemiplegia and hemiparesis following cerebral infarction affecting unspecified side: Secondary | ICD-10-CM | POA: Diagnosis not present

## 2015-11-15 DIAGNOSIS — R519 Headache, unspecified: Secondary | ICD-10-CM

## 2015-11-15 DIAGNOSIS — R51 Headache: Secondary | ICD-10-CM

## 2015-11-15 DIAGNOSIS — R269 Unspecified abnormalities of gait and mobility: Secondary | ICD-10-CM | POA: Insufficient documentation

## 2015-11-15 NOTE — Patient Instructions (Signed)
Continue Lamictal 100 mg twice daily, does not need refills Call for any seizure activity Follow-up in 6-8 months

## 2015-11-15 NOTE — Progress Notes (Signed)
I have read the note, and I agree with the clinical assessment and plan.  Deasiah Hagberg KEITH   

## 2015-11-15 NOTE — Progress Notes (Signed)
[facility]  PATIENT: [name] [name] DOB: 04/07/1941   REASON FOR VISIT: Follow-up for epilepsy, history of stroke with hemiparesis HISTORY FROM: Patient and daughter    HISTORY OF PRESENT ILLNESS: [name] [name], 75 year old female returns for follow-up. She has a history of right brain stroke that occurred in December 2010 with significant left hemiparesis. She remains on Plavix and aspirin without further stroke or TIA symptoms. She also has a history of seizure disorder with breakthrough seizure 04/02/2015. She was on Keppra at the time and could not tolerate higher doses due to increased headache. She was successfully switched to Lamictal 100 mg twice daily, and she titrated off of her Keppra. She has not had further seizure events. She denies any side effects to the Lamictal. She lives with her daughter, requires assistance for all activities of daily living. She denies any new symptoms of numbness weakness of the face arms or legs. She has rare headache relieved with Tylenol. She ambulates in the home with a quad cane. She had a fall several months ago and was seen in the emergency room for left hip pain however x-ray of left hip without acute findings. She returns for reevaluation  HISTORY [name]. Bressman is a 75 year old right-handed black female with a history of cerebrovascular disease with a right brain stroke and a significant left hemiparesis. The patient has had intermittent seizures, the last seizure was approximately one year ago, but the patient was in the emergency room on 04/02/2015 with a recurrent seizure. The patient had a brief warning that the seizure was going to occur, and then went into a generalized event. The patient has been on Keppra taking 250 mg daily, she indicates that she has not tolerated higher doses secondary to increased headaches. The patient does not operate a motor vehicle. She has not noted any new symptoms of numbness or weakness on the face, arms,  or legs. She does have intermittent headaches. She returns to this office for an evaluation.   REVIEW OF SYSTEMS: Full 14 system review of systems performed and notable only for those listed, all others are neg:  Constitutional: neg  Cardiovascular: neg Ear/Nose/Throat: neg  Skin: neg Eyes: neg Respiratory: neg Gastroitestinal: Constipation Hematology/Lymphatic: neg  Endocrine: neg Musculoskeletal: Weakness, gait disturbance Allergy/Immunology: neg Neurological: History of seizures occasional headache Psychiatric: neg Sleep : neg   ALLERGIES: Allergies  Allergen Reactions  . Latex Rash  . Penicillins Rash    Hives  . Tape Rash    HOME MEDICATIONS: Outpatient Prescriptions Prior to Visit  Medication Sig Dispense Refill  . acetaminophen (TYLENOL) 500 MG tablet Take 500-1,000 mg by mouth every 8 (eight) hours as needed for mild pain.    Marland Kitchen aspirin EC 81 MG tablet Take 1 tablet (81 mg total) by mouth daily.    Marland Kitchen atorvastatin (LIPITOR) 20 MG tablet Take 20 mg by mouth every evening.     . cholecalciferol (VITAMIN D) 1000 UNITS tablet Take 1,000 Units by mouth daily.    . clopidogrel (PLAVIX) 75 MG tablet Take 75 mg by mouth daily.  0  . lamoTRIgine (LAMICTAL) 100 MG tablet TAKE 1 TABLET(100 MG) BY MOUTH TWICE DAILY 180 tablet 0  . lisinopril (PRINIVIL,ZESTRIL) 20 MG tablet Take 20 mg by mouth daily.    . metoprolol succinate (TOPROL-XL) 50 MG 24 hr tablet Take 50 mg by mouth daily. Take with or immediately following a meal.     No facility-administered medications prior to visit.    PAST  MEDICAL HISTORY: Past Medical History  Diagnosis Date  . CAD (coronary artery disease)   . Stroke (HCC)   . Hypertension   . Hypercholesterolemia   . Peroneal DVT (deep venous thrombosis) (HCC)   . Osteoporosis   . Seizures (HCC)   . Post-menopausal   . Heart attack (HCC)   . Peripheral vascular disease (HCC)     Left superficial femoral artery  . Hemiparesis and alteration of  sensations as late effects of stroke (HCC) 03/14/2013  . Headache(784.0) 03/14/2013  . Cervical spine fracture [facility](HCC)     Not requiring surgery    PAST SURGICAL HISTORY: Past Surgical History  Procedure Laterality Date  . Cataract extraction    . Coronary artery bypass graft      x3    FAMILY HISTORY: Family History  Problem Relation Age of Onset  . Heart attack Mother   . Heart attack Father   . Heart attack Sister   . Heart attack Brother     SOCIAL HISTORY: Social History   Social History  . Marital Status: Widowed    Spouse Name: N/A  . Number of Children: 5  . Years of Education: 10   Occupational History  . Retired    Social History Main Topics  . Smoking status: Never Smoker   . Smokeless tobacco: Never Used  . Alcohol Use: No  . Drug Use: No  . Sexual Activity: Not on file   Other Topics Concern  . Not on file   Social History Narrative   Patient is a widowed and lives with her daughter [name](Sherry).   Patient has three living children and two are deceased.   Patient is retired.   Patient has a 10th grade education.   Patient is right-handed.   Patient occasionally drinks caffeine.           PHYSICAL EXAM  Filed Vitals:   11/15/15 0840  BP: 162/75  Pulse: 61  Height: 5\' 9"  (1.753 m)   There is no weight on file to calculate BMI.  Generalized: Well developed, in no acute distress  Seated in wheelchair Head: normocephalic and atraumatic,. Oropharynx benign  Neck: Supple, no carotid bruits  Cardiac: Regular rate rhythm, no murmur   Neurological examination  Mentation: Alert oriented to time, place, history taking. Attention span and concentration appropriate. Recent and remote memory intact.  Follows all commands speech and language fluent.   Cranial nerve II-XII: Pupils were equal round reactive to light extraocular movements were full, visual field were full with the exception of a mild left inferior quadrantopsia. Facial sensation and  strength were normal. Hearing was intact to finger rubbing bilaterally. Uvula tongue midline. head turning and shoulder shrug were normal and symmetric.Tongue protrusion into cheek strength was normal. Motor: normal bulk and tone, full strength in the BUE, BLE, on the right. On the left side the patient has significant weakness of the left upper extremity with minimal grip and flexion extension minimal at the elbow. She cannot raise the left arm above the head . 3/5 strength with hip flexion and knee extension and wears a left ankle and foot brace. Sensory: normal and symmetric to light touch, pinprick, and  Vibration, on the right decreased on the left  Coordination: finger-nose-finger, heel-to-shin bilaterally, no dysmetria on the right unable to perform on the left. Reflexes: Symmetric upper and lower  Gait and Station: In wheelchair not ambulated   DIAGNOSTIC DATA (LABS, IMAGING, TESTING) - I reviewed patient records, labs, notes,  testing and imaging myself where available.  Lab Results  Component Value Date   WBC 4.8 04/02/2015   HGB 13.4 04/02/2015   HCT 42.2 04/02/2015   MCV 89.0 04/02/2015   PLT 154 04/02/2015      Component Value Date/Time   NA 139 04/02/2015 0850   K 3.7 04/02/2015 0850   CL 106 04/02/2015 0850   CO2 23 04/02/2015 0850   GLUCOSE 101* 04/02/2015 0850   BUN 11 04/02/2015 0850   CREATININE 0.83 04/02/2015 0850   CALCIUM 9.1 04/02/2015 0850   PROT 7.6 09/29/2013 1156   ALBUMIN 3.5 09/29/2013 1156   AST 19 09/29/2013 1156   ALT 16 09/29/2013 1156   ALKPHOS 72 09/29/2013 1156   BILITOT 0.3 09/29/2013 1156   GFRNONAA >60 04/02/2015 0850   GFRAA >60 04/02/2015 0850    ASSESSMENT AND PLAN  75 y.o. year old female  has a past medical history of Right brain stroke in 2010 with left hemiparesis. Also has a history of seizures. Last seizure event occurred 04/02/2015 and her medication was changed at that time from Keppra to Lamictal.  Continue Lamictal 100 mg  twice daily, does not need refills Call for any seizure activity Follow-up in 6-8 months Nilda Riggs, [facility], [facility], APRN  [facility] [address], [address] [place], [place] [address] [phone]

## 2015-12-01 ENCOUNTER — Emergency Department (HOSPITAL_COMMUNITY): Payer: Medicare PPO

## 2015-12-01 ENCOUNTER — Observation Stay (HOSPITAL_COMMUNITY)
Admission: EM | Admit: 2015-12-01 | Discharge: 2015-12-03 | Disposition: A | Discharge status: Home / Self Care | Payer: Medicare PPO | Attending: Internal Medicine | Admitting: Internal Medicine

## 2015-12-01 ENCOUNTER — Observation Stay (HOSPITAL_COMMUNITY): Payer: Medicare PPO

## 2015-12-01 ENCOUNTER — Encounter (HOSPITAL_COMMUNITY): Payer: Self-pay | Admitting: Emergency Medicine

## 2015-12-01 DIAGNOSIS — S299XXA Unspecified injury of thorax, initial encounter: Secondary | ICD-10-CM | POA: Diagnosis not present

## 2015-12-01 DIAGNOSIS — Y998 Other external cause status: Secondary | ICD-10-CM | POA: Diagnosis not present

## 2015-12-01 DIAGNOSIS — S32010A Wedge compression fracture of first lumbar vertebra, initial encounter for closed fracture: Secondary | ICD-10-CM | POA: Diagnosis not present

## 2015-12-01 DIAGNOSIS — Z91040 Latex allergy status: Secondary | ICD-10-CM | POA: Insufficient documentation

## 2015-12-01 DIAGNOSIS — I2510 Atherosclerotic heart disease of native coronary artery without angina pectoris: Secondary | ICD-10-CM | POA: Insufficient documentation

## 2015-12-01 DIAGNOSIS — M810 Age-related osteoporosis without current pathological fracture: Secondary | ICD-10-CM | POA: Insufficient documentation

## 2015-12-01 DIAGNOSIS — R109 Unspecified abdominal pain: Secondary | ICD-10-CM | POA: Diagnosis not present

## 2015-12-01 DIAGNOSIS — I252 Old myocardial infarction: Secondary | ICD-10-CM | POA: Insufficient documentation

## 2015-12-01 DIAGNOSIS — R531 Weakness: Secondary | ICD-10-CM | POA: Diagnosis not present

## 2015-12-01 DIAGNOSIS — S3991XA Unspecified injury of abdomen, initial encounter: Secondary | ICD-10-CM | POA: Diagnosis not present

## 2015-12-01 DIAGNOSIS — I25118 Atherosclerotic heart disease of native coronary artery with other forms of angina pectoris: Secondary | ICD-10-CM | POA: Diagnosis not present

## 2015-12-01 DIAGNOSIS — R0789 Other chest pain: Secondary | ICD-10-CM | POA: Diagnosis not present

## 2015-12-01 DIAGNOSIS — Y9289 Other specified places as the place of occurrence of the external cause: Secondary | ICD-10-CM | POA: Diagnosis not present

## 2015-12-01 DIAGNOSIS — M79601 Pain in right arm: Secondary | ICD-10-CM | POA: Diagnosis not present

## 2015-12-01 DIAGNOSIS — Z880 Allergy status to penicillin: Secondary | ICD-10-CM | POA: Insufficient documentation

## 2015-12-01 DIAGNOSIS — K838 Other specified diseases of biliary tract: Secondary | ICD-10-CM | POA: Diagnosis not present

## 2015-12-01 DIAGNOSIS — Z792 Long term (current) use of antibiotics: Secondary | ICD-10-CM | POA: Diagnosis not present

## 2015-12-01 DIAGNOSIS — E7800 Pure hypercholesterolemia, unspecified: Secondary | ICD-10-CM | POA: Diagnosis not present

## 2015-12-01 DIAGNOSIS — W19XXXA Unspecified fall, initial encounter: Secondary | ICD-10-CM

## 2015-12-01 DIAGNOSIS — Z7902 Long term (current) use of antithrombotics/antiplatelets: Secondary | ICD-10-CM | POA: Diagnosis not present

## 2015-12-01 DIAGNOSIS — I69359 Hemiplegia and hemiparesis following cerebral infarction affecting unspecified side: Secondary | ICD-10-CM

## 2015-12-01 DIAGNOSIS — R072 Precordial pain: Secondary | ICD-10-CM

## 2015-12-01 DIAGNOSIS — Y9389 Activity, other specified: Secondary | ICD-10-CM | POA: Insufficient documentation

## 2015-12-01 DIAGNOSIS — Z8673 Personal history of transient ischemic attack (TIA), and cerebral infarction without residual deficits: Secondary | ICD-10-CM | POA: Diagnosis not present

## 2015-12-01 DIAGNOSIS — R51 Headache: Secondary | ICD-10-CM | POA: Diagnosis not present

## 2015-12-01 DIAGNOSIS — Z951 Presence of aortocoronary bypass graft: Secondary | ICD-10-CM | POA: Insufficient documentation

## 2015-12-01 DIAGNOSIS — I739 Peripheral vascular disease, unspecified: Secondary | ICD-10-CM | POA: Diagnosis not present

## 2015-12-01 DIAGNOSIS — Z8781 Personal history of (healed) traumatic fracture: Secondary | ICD-10-CM | POA: Insufficient documentation

## 2015-12-01 DIAGNOSIS — I10 Essential (primary) hypertension: Secondary | ICD-10-CM

## 2015-12-01 DIAGNOSIS — X58XXXA Exposure to other specified factors, initial encounter: Secondary | ICD-10-CM | POA: Insufficient documentation

## 2015-12-01 DIAGNOSIS — R079 Chest pain, unspecified: Secondary | ICD-10-CM | POA: Diagnosis not present

## 2015-12-01 DIAGNOSIS — Z86718 Personal history of other venous thrombosis and embolism: Secondary | ICD-10-CM | POA: Insufficient documentation

## 2015-12-01 DIAGNOSIS — Z7982 Long term (current) use of aspirin: Secondary | ICD-10-CM | POA: Diagnosis not present

## 2015-12-01 DIAGNOSIS — I69398 Other sequelae of cerebral infarction: Secondary | ICD-10-CM

## 2015-12-01 DIAGNOSIS — S0083XA Contusion of other part of head, initial encounter: Secondary | ICD-10-CM | POA: Diagnosis not present

## 2015-12-01 DIAGNOSIS — G40909 Epilepsy, unspecified, not intractable, without status epilepticus: Secondary | ICD-10-CM

## 2015-12-01 DIAGNOSIS — S0990XA Unspecified injury of head, initial encounter: Secondary | ICD-10-CM | POA: Diagnosis not present

## 2015-12-01 DIAGNOSIS — R404 Transient alteration of awareness: Secondary | ICD-10-CM | POA: Diagnosis not present

## 2015-12-01 DIAGNOSIS — Y92099 Unspecified place in other non-institutional residence as the place of occurrence of the external cause: Secondary | ICD-10-CM

## 2015-12-01 DIAGNOSIS — Y92009 Unspecified place in unspecified non-institutional (private) residence as the place of occurrence of the external cause: Secondary | ICD-10-CM

## 2015-12-01 LAB — CBC
HCT: 44.3 % (ref 36.0–46.0)
Hemoglobin: 13.8 g/dL (ref 12.0–15.0)
MCH: 27.1 pg (ref 26.0–34.0)
MCHC: 31.2 g/dL (ref 30.0–36.0)
MCV: 87 fL (ref 78.0–100.0)
PLATELETS: 193 10*3/uL (ref 150–400)
RBC: 5.09 MIL/uL (ref 3.87–5.11)
RDW: 13.5 % (ref 11.5–15.5)
WBC: 4 10*3/uL (ref 4.0–10.5)

## 2015-12-01 LAB — BASIC METABOLIC PANEL
Anion gap: 8 (ref 5–15)
BUN: 8 mg/dL (ref 6–20)
CO2: 27 mmol/L (ref 22–32)
Calcium: 9.7 mg/dL (ref 8.9–10.3)
Chloride: 106 mmol/L (ref 101–111)
Creatinine, Ser: 0.78 mg/dL (ref 0.44–1.00)
GFR calc Af Amer: >60 mL/min (ref >60)
Glucose, Bld: 102 mg/dL — ABNORMAL HIGH (ref 65–99)
POTASSIUM: 4 mmol/L (ref 3.5–5.1)
SODIUM: 141 mmol/L (ref 135–145)

## 2015-12-01 LAB — TROPONIN I: Troponin I: 0.05 ng/mL — ABNORMAL HIGH (ref <0.031)

## 2015-12-01 LAB — HEPATIC FUNCTION PANEL
ALK PHOS: 77 U/L (ref 38–126)
ALT: 22 U/L (ref 14–54)
AST: 24 U/L (ref 15–41)
Albumin: 3.4 g/dL — ABNORMAL LOW (ref 3.5–5.0)
BILIRUBIN TOTAL: 0.5 mg/dL (ref 0.3–1.2)
TOTAL PROTEIN: 7 g/dL (ref 6.5–8.1)

## 2015-12-01 LAB — I-STAT CHEM 8, ED
BUN: 11 mg/dL (ref 6–20)
CALCIUM ION: 1.22 mmol/L (ref 1.13–1.30)
CHLORIDE: 104 mmol/L (ref 101–111)
Creatinine, Ser: 0.7 mg/dL (ref 0.44–1.00)
Glucose, Bld: 94 mg/dL (ref 65–99)
HCT: 46 % (ref 36.0–46.0)
HEMOGLOBIN: 15.6 g/dL — AB (ref 12.0–15.0)
Potassium: 4.1 mmol/L (ref 3.5–5.1)
SODIUM: 143 mmol/L (ref 135–145)
TCO2: 28 mmol/L (ref 0–100)

## 2015-12-01 LAB — I-STAT TROPONIN, ED: TROPONIN I, POC: 0.01 ng/mL (ref 0.00–0.08)

## 2015-12-01 LAB — LIPASE, BLOOD: Lipase: 39 U/L (ref 11–51)

## 2015-12-01 LAB — SEDIMENTATION RATE: Sed Rate: 22 mm/hr (ref 0–22)

## 2015-12-01 LAB — AMYLASE: Amylase: 102 U/L — ABNORMAL HIGH (ref 28–100)

## 2015-12-01 MED ORDER — ASPIRIN EC 81 MG PO TBEC
81.0000 mg | DELAYED_RELEASE_TABLET | Freq: Every day | ORAL | Status: DC
  Administered 2015-12-02 – 2015-12-03 (×2): 81 mg via ORAL
  Filled 2015-12-01 (×2): qty 1

## 2015-12-01 MED ORDER — IOPAMIDOL (ISOVUE-370) INJECTION 76%
INTRAVENOUS | Status: AC
  Administered 2015-12-01: 100 mL
  Filled 2015-12-01: qty 100

## 2015-12-01 MED ORDER — GI COCKTAIL ~~LOC~~
30.0000 mL | Freq: Four times a day (QID) | ORAL | Status: DC | PRN
Start: 2015-12-01 — End: 2015-12-03

## 2015-12-01 MED ORDER — LISINOPRIL 20 MG PO TABS
30.0000 mg | ORAL_TABLET | Freq: Every day | ORAL | Status: DC
  Administered 2015-12-01 – 2015-12-02 (×2): 30 mg via ORAL
  Filled 2015-12-01 (×2): qty 1

## 2015-12-01 MED ORDER — ENOXAPARIN SODIUM 40 MG/0.4ML ~~LOC~~ SOLN
40.0000 mg | SUBCUTANEOUS | Status: DC
  Administered 2015-12-01 – 2015-12-02 (×2): 40 mg via SUBCUTANEOUS
  Filled 2015-12-01 (×2): qty 0.4

## 2015-12-01 MED ORDER — LISINOPRIL 20 MG PO TABS: 20.0000 mg | ORAL_TABLET | Freq: Every day | ORAL | Status: DC

## 2015-12-01 MED ORDER — CLOPIDOGREL BISULFATE 75 MG PO TABS
75.0000 mg | ORAL_TABLET | Freq: Every day | ORAL | Status: DC
  Administered 2015-12-02 – 2015-12-03 (×2): 75 mg via ORAL
  Filled 2015-12-01 (×2): qty 1

## 2015-12-01 MED ORDER — ENOXAPARIN SODIUM 40 MG/0.4ML ~~LOC~~ SOLN: 40.0000 mg | SUBCUTANEOUS | Status: DC

## 2015-12-01 MED ORDER — ONDANSETRON HCL 4 MG/2ML IJ SOLN
4.0000 mg | Freq: Four times a day (QID) | INTRAMUSCULAR | Status: DC | PRN
  Administered 2015-12-02 (×2): 4 mg via INTRAVENOUS
  Filled 2015-12-01: qty 2

## 2015-12-01 MED ORDER — METOPROLOL SUCCINATE ER 50 MG PO TB24
50.0000 mg | ORAL_TABLET | Freq: Every day | ORAL | Status: DC
Start: 2015-12-02 — End: 2015-12-03
  Administered 2015-12-02 – 2015-12-03 (×2): 50 mg via ORAL
  Filled 2015-12-01 (×2): qty 1

## 2015-12-01 MED ORDER — MORPHINE SULFATE (PF) 2 MG/ML IV SOLN
2.0000 mg | INTRAVENOUS | Status: DC | PRN
  Filled 2015-12-01: qty 1

## 2015-12-01 MED ORDER — HYDRALAZINE HCL 20 MG/ML IJ SOLN
10.0000 mg | INTRAMUSCULAR | Status: DC | PRN
  Filled 2015-12-01: qty 1

## 2015-12-01 MED ORDER — ACETAMINOPHEN 325 MG PO TABS: 650.0000 mg | ORAL_TABLET | ORAL | Status: DC | PRN

## 2015-12-01 MED ORDER — HYDRALAZINE HCL 20 MG/ML IJ SOLN
10.0000 mg | INTRAMUSCULAR | Status: DC | PRN
  Administered 2015-12-01: 10 mg via INTRAVENOUS

## 2015-12-01 MED ORDER — VITAMIN D 1000 UNITS PO TABS
1000.0000 [IU] | ORAL_TABLET | Freq: Every day | ORAL | Status: DC
  Administered 2015-12-02 – 2015-12-03 (×2): 1000 [IU] via ORAL
  Filled 2015-12-01 (×2): qty 1

## 2015-12-01 MED ORDER — ATORVASTATIN CALCIUM 20 MG PO TABS
20.0000 mg | ORAL_TABLET | Freq: Every evening | ORAL | Status: DC
  Administered 2015-12-02 – 2015-12-03 (×2): 20 mg via ORAL
  Filled 2015-12-01 (×2): qty 1

## 2015-12-01 MED ORDER — LAMOTRIGINE 100 MG PO TABS
100.0000 mg | ORAL_TABLET | Freq: Two times a day (BID) | ORAL | Status: DC
  Administered 2015-12-01 – 2015-12-03 (×4): 100 mg via ORAL
  Filled 2015-12-01 (×4): qty 1

## 2015-12-01 MED ORDER — NITROGLYCERIN 0.4 MG SL SUBL
0.4000 mg | SUBLINGUAL_TABLET | SUBLINGUAL | Status: DC | PRN
  Administered 2015-12-01 (×2): 0.4 mg via SUBLINGUAL
  Filled 2015-12-01 (×3): qty 1

## 2015-12-01 NOTE — ED Provider Notes (Signed)
CSN: [id number]     Arrival date & time 12/01/15  1157 History   First MD Initiated Contact with Patient 12/01/15 1204     Chief Complaint  Patient presents with  . Chest Pain     (Consider location/radiation/quality/duration/timing/severity/associated sxs/prior Treatment) The history is provided by the patient.  Alinda Deemaula J Metsker is a 75 y.o. female hx of CAD s/p CABG, stroke with L sided residual weakness, HTN, here with chest pain, back pain, arm pain. Patient states that she woke up this morning with sudden onset acute severe left-sided chest pain radiated to her back and into the left arm as well as the right arm. Patient states that she still has 6 out of 10 pain currently. She had previous CABG before but no cardiac stents. She moved here from [place] to be with her daughter and has no cardiologist here. She fell several days ago and hit her head and noticed some headaches and bruising left eyelid. Has chronic l sided weakness that is not worsening. Took ASA 325 mg at home today.    Past Medical History  Diagnosis Date  . CAD (coronary artery disease)   . Stroke (HCC)   . Hypertension   . Hypercholesterolemia   . Peroneal DVT (deep venous thrombosis) (HCC)   . Osteoporosis   . Seizures (HCC)   . Post-menopausal   . Heart attack (HCC)   . Peripheral vascular disease (HCC)     Left superficial femoral artery  . Hemiparesis and alteration of sensations as late effects of stroke (HCC) 03/14/2013  . Headache(784.0) 03/14/2013  . Cervical spine fracture [facility](HCC)     Not requiring surgery   Past Surgical History  Procedure Laterality Date  . Cataract extraction    . Coronary artery bypass graft      x3   Family History  Problem Relation Age of Onset  . Heart attack Mother   . Heart attack Father   . Heart attack Sister   . Heart attack Brother    Social History  Substance Use Topics  . Smoking status: Never Smoker   . Smokeless tobacco: Never Used  . Alcohol Use: No   OB  History    No data available     Review of Systems  Cardiovascular: Positive for chest pain.  All other systems reviewed and are negative.     Allergies  Latex; Penicillins; and Tape  Home Medications   Prior to Admission medications   Medication Sig Start Date End Date Taking? Authorizing Provider  acetaminophen (TYLENOL) 500 MG tablet Take 500-1,000 mg by mouth every 8 (eight) hours as needed for mild pain.   Yes Historical Provider, MD  aspirin EC 81 MG tablet Take 1 tablet (81 mg total) by mouth daily. 02/02/14  Yes Ronal FearLynn E Lam, NP  atorvastatin (LIPITOR) 20 MG tablet Take 20 mg by mouth every evening.    Yes Historical Provider, MD  cholecalciferol (VITAMIN D) 1000 UNITS tablet Take 1,000 Units by mouth daily.   Yes Historical Provider, MD  clopidogrel (PLAVIX) 75 MG tablet Take 75 mg by mouth daily. 03/18/15  Yes Historical Provider, MD  lamoTRIgine (LAMICTAL) 100 MG tablet TAKE 1 TABLET(100 MG) BY MOUTH TWICE DAILY 10/24/15  Yes York Spanielharles K Willis, MD  lisinopril (PRINIVIL,ZESTRIL) 20 MG tablet Take 20 mg by mouth daily.   Yes Historical Provider, MD  metoprolol succinate (TOPROL-XL) 50 MG 24 hr tablet Take 50 mg by mouth daily. Take with or immediately following a meal.  Yes Historical Provider, MD   BP 147/68 mmHg  Pulse 57  Temp(Src) 98 F (36.7 C) (Oral)  Resp 19  SpO2 99% Physical Exam  Constitutional: She is oriented to person, place, and time. She appears well-nourished.  Chronically ill   HENT:  Head: Normocephalic.  Mouth/Throat: Oropharynx is clear and moist.  Ecchymosis under both eyes, abrasion under L eye, extra ocular movements intact   Eyes: Conjunctivae are normal. Pupils are equal, round, and reactive to light.  Neck: Neck supple.  Cardiovascular: Normal rate, regular rhythm and normal heart sounds.   Pulmonary/Chest: Effort normal and breath sounds normal. No respiratory distress. She has no wheezes. She has no rales.  Abdominal: Soft. Bowel sounds are  normal. She exhibits no distension. There is no tenderness. There is no rebound.  Musculoskeletal: Normal range of motion. She exhibits no edema or tenderness.  Neurological: She is alert and oriented to person, place, and time.  Strength 3/5 L side, 5/5 R side (chronic). No slurred speech   Skin: Skin is warm and dry.  Psychiatric: She has a normal mood and affect. Her behavior is normal. Judgment and thought content normal.  Nursing note and vitals reviewed.   ED Course  Procedures (including critical care time) Labs Review Labs Reviewed  BASIC METABOLIC PANEL - Abnormal; Notable for the following:    Glucose, Bld 102 (*)    All other components within normal limits  I-STAT CHEM 8, ED - Abnormal; Notable for the following:    Hemoglobin 15.6 (*)    All other components within normal limits  CBC  I-STAT TROPOININ, ED    Imaging Review Ct Head Wo Contrast  12/01/2015  CLINICAL DATA:  Bilateral orbital hematomas after fall. No loss of consciousness. EXAM: CT HEAD WITHOUT CONTRAST TECHNIQUE: Contiguous axial images were obtained from the base of the skull through the vertex without intravenous contrast. COMPARISON:  CT scan of September 29, 2013. FINDINGS: Bony calvarium appears intact. Encephalomalacia is again noted in the right MCA territory consistent with old infarction. No mass effect or midline shift is noted. Ventricular size is within normal limits. There is no evidence of mass lesion, hemorrhage or acute infarction. IMPRESSION: Old right MCA infarction.  No acute intracranial abnormality seen. Electronically Signed   By: Lupita Raider, M.D.   On: 12/01/2015 14:14   Dg Chest Portable 1 View  12/01/2015  CLINICAL DATA:  RIGHT-sided chest pain and arm pain for unknown period of time, coronary artery disease, hypertension, prior stroke EXAM: PORTABLE CHEST 1 VIEW COMPARISON:  Portable exam 1216 hours compared to 04/02/2015 FINDINGS: Upper normal heart size post median sternotomy.  Mediastinal contours and pulmonary vascularity normal. Lungs clear. No pleural effusion or pneumothorax. Bones unremarkable. IMPRESSION: No acute abnormalities. Electronically Signed   By: Ulyses Southward M.D.   On: 12/01/2015 12:50   Ct Angio Chest Aorta W/cm &/or Wo/cm  12/01/2015  CLINICAL DATA:  Larey Seat Monday, bilateral orbital hematomas, abrasion to left zygoma. Patient describes right axillary pain extending into back. Patient on blood thinners. EXAM: CT ANGIOGRAPHY CHEST, ABDOMEN AND PELVIS TECHNIQUE: Multidetector CT imaging through the chest, abdomen and pelvis was performed using the standard protocol during bolus administration of intravenous contrast. Multiplanar reconstructed images and MIPs were obtained and reviewed to evaluate the vascular anatomy. CONTRAST:  100 cc Isovue 370 COMPARISON:  None. FINDINGS: CTA CHEST FINDINGS Thoracic aorta appears intact and normal in configuration. No aneurysm or dissection. Scattered atherosclerotic changes seen along the walls of the  thoracic aorta. No pulmonary embolism identified. Heart size is upper normal. Patient is status post median sternotomy for presumed CABG. Diffuse coronary artery calcifications are noted. No pericardial effusion. No pulmonary infiltrate, mass or effusion. No pneumothorax. Trachea and central bronchi are unremarkable. No masses or enlarged lymph nodes seen within the mediastinum or perihilar regions. No acute or suspicious osseous lesion. No rib fracture or displacement. Mild degenerative spurring noted throughout the scoliotic thoracic spine. Review of the MIP images confirms the above findings. CTA ABDOMEN AND PELVIS FINDINGS Atherosclerotic changes are seen along the walls of the normal-caliber abdominal aorta, at the origins of the aortic branch vessels and throughout the pelvic vasculature. No arterial occlusion seen. No aneurysm or dissection. Liver, gallbladder, spleen, pancreas and adrenal glands appear normal. Areas of scarring  noted within each renal cortex. No renal stone or hydronephrosis. No ureteral or bladder calculi identified. There is common bile duct dilatation measuring 12 mm diameter. No bile duct stone seen. No significant intrahepatic bile duct dilatation. Bowel is normal in caliber. No bowel wall thickening or evidence of bowel wall inflammation seen. Moderate amount of stool and gas throughout the colon. Appendix is not seen but there are no inflammatory changes about the cecum to suggest acute appendicitis. Small free fluid in the lower pelvis. No other free fluid seen. No abscess collection. No free intraperitoneal air. There are 4 lumbar type vertebral bodies. Based on this numbering, there is a compression fracture deformity of the L1 vertebral body which is of uncertain age but favored to be chronic. No acute-appearing osseous abnormality seen. Review of the MIP images confirms the above findings. IMPRESSION: 1. No acute- appearing abnormality of the thoracic or abdominal aorta. No aortic aneurysm or dissection. No evidence of traumatic aortic injury. 2. Overall, no evidence of acute intrathoracic abnormality. Chronic findings detailed above. 3. Common bile duct dilatation measuring up to 12 mm diameter. No obstructing bile duct stone identified. Recommend correlation with liver function tests. If abnormal, would consider MRCP for further characterization. 4. Compression fracture deformity of the L1 vertebral body, approximately 20% compressed anteriorly, of uncertain age but favored to be chronic. 5. Additional chronic/incidental findings detailed above. Electronically Signed   By: Bary Richard M.D.   On: 12/01/2015 14:34   Ct Cta Abd/pel W/cm &/or W/o Cm  12/01/2015  CLINICAL DATA:  Larey Seat Monday, bilateral orbital hematomas, abrasion to left zygoma. Patient describes right axillary pain extending into back. Patient on blood thinners. EXAM: CT ANGIOGRAPHY CHEST, ABDOMEN AND PELVIS TECHNIQUE: Multidetector CT imaging  through the chest, abdomen and pelvis was performed using the standard protocol during bolus administration of intravenous contrast. Multiplanar reconstructed images and MIPs were obtained and reviewed to evaluate the vascular anatomy. CONTRAST:  100 cc Isovue 370 COMPARISON:  None. FINDINGS: CTA CHEST FINDINGS Thoracic aorta appears intact and normal in configuration. No aneurysm or dissection. Scattered atherosclerotic changes seen along the walls of the thoracic aorta. No pulmonary embolism identified. Heart size is upper normal. Patient is status post median sternotomy for presumed CABG. Diffuse coronary artery calcifications are noted. No pericardial effusion. No pulmonary infiltrate, mass or effusion. No pneumothorax. Trachea and central bronchi are unremarkable. No masses or enlarged lymph nodes seen within the mediastinum or perihilar regions. No acute or suspicious osseous lesion. No rib fracture or displacement. Mild degenerative spurring noted throughout the scoliotic thoracic spine. Review of the MIP images confirms the above findings. CTA ABDOMEN AND PELVIS FINDINGS Atherosclerotic changes are seen along the walls of the  normal-caliber abdominal aorta, at the origins of the aortic branch vessels and throughout the pelvic vasculature. No arterial occlusion seen. No aneurysm or dissection. Liver, gallbladder, spleen, pancreas and adrenal glands appear normal. Areas of scarring noted within each renal cortex. No renal stone or hydronephrosis. No ureteral or bladder calculi identified. There is common bile duct dilatation measuring 12 mm diameter. No bile duct stone seen. No significant intrahepatic bile duct dilatation. Bowel is normal in caliber. No bowel wall thickening or evidence of bowel wall inflammation seen. Moderate amount of stool and gas throughout the colon. Appendix is not seen but there are no inflammatory changes about the cecum to suggest acute appendicitis. Small free fluid in the lower  pelvis. No other free fluid seen. No abscess collection. No free intraperitoneal air. There are 4 lumbar type vertebral bodies. Based on this numbering, there is a compression fracture deformity of the L1 vertebral body which is of uncertain age but favored to be chronic. No acute-appearing osseous abnormality seen. Review of the MIP images confirms the above findings. IMPRESSION: 1. No acute- appearing abnormality of the thoracic or abdominal aorta. No aortic aneurysm or dissection. No evidence of traumatic aortic injury. 2. Overall, no evidence of acute intrathoracic abnormality. Chronic findings detailed above. 3. Common bile duct dilatation measuring up to 12 mm diameter. No obstructing bile duct stone identified. Recommend correlation with liver function tests. If abnormal, would consider MRCP for further characterization. 4. Compression fracture deformity of the L1 vertebral body, approximately 20% compressed anteriorly, of uncertain age but favored to be chronic. 5. Additional chronic/incidental findings detailed above. Electronically Signed   By: Bary Richard M.D.   On: 12/01/2015 14:34   I have personally reviewed and evaluated these images and lab results as part of my medical decision-making.   EKG Interpretation   Date/Time:  Sunday December 01 2015 12:11:38 EDT Ventricular Rate:  60 PR Interval:  162 QRS Duration: 93 QT Interval:  412 QTC Calculation: 412 R Axis:   29 Text Interpretation:  Sinus rhythm LVH with secondary repolarization  abnormality Probable anterior infarct, age indeterminate No significant  change since last tracing Confirmed by Shaana Acocella  MD, Tamesha Ellerbrock ([id number]) on 12/01/2015  2:31:54 PM      MDM   Final diagnoses:  Abdominal pain   CHARONDA HEFTER is a 75 y.o. female here with hypertension, chest pain, fall. Has ecchymosis around eyes. Hypertensive to 200/100. Consider hypertensive bleed vs dissection vs ACS. Will get labs, CT head, CT dissection study. Will admit. Initial EKG  showed possible STEMI V2, V3. Consulted Dr. Tresa Endo from STEMI doc who states that it is likely LVH.   3:12 PM Trop neg x 1. BP now 147/68 after nitro SL. Chest pain free. Dissection study neg. CT head neg. I consulted Dr. Gala Romney, who recommend medicine admission and consult as needed. Will admit.     Richardean Canal, MD 12/01/15 [phone]

## 2015-12-01 NOTE — ED Notes (Signed)
Hx of stroke in 2010, L sided weakness

## 2015-12-01 NOTE — H&P (Signed)
[facility] History and Physical                                                                                    [name] [name], is a 75 y.o. female  MRN: [id number]   DOB - Mar 23, 1941  Admit Date - 12/01/2015  Outpatient Primary MD for the patient is Leanor Rubenstein, MD  Referring MD: Silverio Lay / ER  Consulting M.D: [facility]  PMH: Past Medical History  Diagnosis Date  . CAD (coronary artery disease)   . Stroke (HCC)   . Hypertension   . Hypercholesterolemia   . Peroneal DVT (deep venous thrombosis) (HCC)   . Osteoporosis   . Seizures (HCC)   . Post-menopausal   . Heart attack (HCC)   . Peripheral vascular disease (HCC)     Left superficial femoral artery  . Hemiparesis and alteration of sensations as late effects of stroke (HCC) 03/14/2013  . Headache(784.0) 03/14/2013  . Cervical spine fracture [facility])     Not requiring surgery      PSH: Past Surgical History  Procedure Laterality Date  . Cataract extraction    . Coronary artery bypass graft      x3     CC:  Chief Complaint  Patient presents with  . Chest Pain     HPI: 75 year old BF PMHx  CAD with CABG in the 1990s, stroke in 2010 left-sided residual weakness utilizes a cane for ambulation, hypertension, dyslipidemia and seizure disorder with remote history of peroneal DVT who presented to the ER with chest pain. Of note the patient had a mechanical fall earlier in the week either on Monday or Tuesday where she landed face forward after tripping over her foot she sustained some abrasions to both cheeks more prominent over the left side of the face. She also had some abrasions on the left side of the chest wall/abdomen. Upon presentation to the ER she reported right armpit pain that radiated to the back after sleeping on her left side. The patient reported that because of her history of CVA she was instructed to avoid sleeping on her left side. Another provider documented the patient was having left-sided chest  discomfort with pain radiating into her back and into the left arm as well as her right arm level 6/10.  When I discussed with patient and her daughter the patient told me that she began having tingling in her right hand with numbness and tingling radiating up through the right arm into the right shoulder through the right chest under the breast and into the back. This was constant in duration and had no associated symptoms such as shortness of breath or diaphoresis. Daughter called EMS given patient's history of CAD and was instructed to give the patient for baby aspirin which did not improve her symptoms. After arrival to the ER patient was given a total of 2 sublingual nitroglycerin with complete resolution of her pain. Since that time the pain has not been reproducible with palpation of anterior chest wall either on the right or left side nor with movement of the right or left arm. Her initial EKG in the ER was reassuring as was  her initial troponin. Of note in the 1990s when the patient had ischemic symptoms prior to CABG this was manifested as dyspnea on exertion as well as bilateral beneath the breast chest pain. Because of patient's limited mobility in setting of her stroke and hemiparesis it is likely she does not have exertional symptomatology. In addition to the above patient has lived in [place] for about 3 years and has never reestablished with a cardiologist.  ER Evaluation and treatment: 98.3-200/86-71-25: Follow-up vitals after nitroglycerin BP 147/68 with a pulse of 57. She is maintaining room air saturations of 100% EKG: Sinus rhythm with ventricular rate 60 bpm, QTC 412 ms, voltage criteria for LVH, downsloping ST segments in the inferior leads unchanged from previous; downsloping ST segments with T-wave inversions in V4 through V6 unchanged from previous EKG, upsloping ST elevation in V1 and V2 and subtly in V3 again unchanged from previous EKGs. PCXR: Unremarkable CT angiography of  the chest aorta and abdomen and pelvis with and without contrast: No aortic dissection, no pulmonary infiltrate mass or effusion, no pneumothorax, diffuse coronary artery calcifications,, bile duct dilatation 12 mm without evidence of obstructing bile duct stone, compression fracture deformity L1 vertebral body 20% compressed anterior uncertain age but favored to be chronic CT head without contrast: Old right MCA infarct otherwise no acute abnormality Lab data: Na 141, K 4.1, BUN 8, Cr 0.78, glucose 102, troponin 0.01, WBCs 4000, hemoglobin 13.8, platelets 193,000 Sublingual nitroglycerin 0.4 mg 2 doses   Review of Systems   In addition to the HPI above,  No Fever-chills, myalgias or other constitutional symptoms No Headache, changes with Vision or hearing, new weakness No problems swallowing food or Liquids, indigestion/reflux No Cough or Shortness of Breath, palpitations, orthopnea or DOE No Abdominal pain, N/V; no melena or hematochezia, no dark tarry stools No dysuria, hematuria or flank pain No new skin rashes, lesions, masses or bruises, No new joints pains-aches No recent weight gain or loss No polyuria, polydypsia or polyphagia,  *A full 10 point Review of Systems was done, except as stated above, all other Review of Systems were negative.  Social History Social History  Substance Use Topics  . Smoking status: Never Smoker   . Smokeless tobacco: Never Used  . Alcohol Use: No    Resides at: Private residence  Lives with: Daughter  Ambulatory status: Must be assisted and has to use a cane   Family History Family History  Problem Relation Age of Onset  . Heart attack Mother   . Heart attack Father   . Heart attack Sister   . Heart attack Brother      Prior to Admission medications   Medication Sig Start Date End Date Taking? Authorizing Provider  acetaminophen (TYLENOL) 500 MG tablet Take 500-1,000 mg by mouth every 8 (eight) hours as needed for mild pain.   Yes  Historical Provider, MD  aspirin EC 81 MG tablet Take 1 tablet (81 mg total) by mouth daily. 02/02/14  Yes Ronal FearLynn E Lam, NP  atorvastatin (LIPITOR) 20 MG tablet Take 20 mg by mouth every evening.    Yes Historical Provider, MD  cholecalciferol (VITAMIN D) 1000 UNITS tablet Take 1,000 Units by mouth daily.   Yes Historical Provider, MD  clopidogrel (PLAVIX) 75 MG tablet Take 75 mg by mouth daily. 03/18/15  Yes Historical Provider, MD  lamoTRIgine (LAMICTAL) 100 MG tablet TAKE 1 TABLET(100 MG) BY MOUTH TWICE DAILY 10/24/15  Yes York Spanielharles K Willis, MD  lisinopril (PRINIVIL,ZESTRIL) 20 MG tablet  Take 20 mg by mouth daily.   Yes Historical Provider, MD  metoprolol succinate (TOPROL-XL) 50 MG 24 hr tablet Take 50 mg by mouth daily. Take with or immediately following a meal.   Yes Historical Provider, MD    Allergies  Allergen Reactions  . Latex Rash  . Penicillins Rash    Has patient had a PCN reaction causing immediate rash, facial/tongue/throat swelling, SOB or lightheadedness with hypotension: No Has patient had a PCN reaction causing severe rash involving mucus membranes or skin necrosis: No Has patient had a PCN reaction that required hospitalization No Has patient had a PCN reaction occurring within the last 10 years: No If all of the above answers are "NO", then may proceed with Cephalosporin use.  . Tape Rash    Physical Exam  Vitals  Blood pressure 164/71, pulse 64, temperature 98.1 F (36.7 C), temperature source Oral, resp. rate 21, SpO2 100 %.   General:  In no acute distress, appears Chronically ill  Psych:  Normal affect, Denies Suicidal or Homicidal ideations, Awake Alert, Oriented X 3.   Neuro: CN II through XII intact, Strength 5/5 right side-strength 1/5 with underlying known chronic hemiparesis and left (typically wears anti-foot drop foot brace on left leg), Sensation intact all 4 extremities.  ENT:  Ears and Eyes appear Normal, patient does have bilateral periorbital  bruising and abrasions as well as abrasions on both cheeks more prominent on the left, Conjunctivae clear sclera mildly injected, PER. Moist oral mucosa without erythema or exudates.  Neck:  Supple, No lymphadenopathy appreciated  Respiratory:  Symmetrical chest wall movement, Good air movement bilaterally, CTAB. Room Air-unable to reproduce chest pain with palpation of anterior chest wall nor with passive and active range of motion of upper extremities  Cardiac:  RRR, No Murmurs, no LE edema noted, no JVD, No carotid bruits, peripheral pulses palpable at 2+  Abdomen:  Positive bowel sounds, Soft, Non tender including with vigorous palpation over epigastrium and right upper quadrant, Non distended,  No masses appreciated, no obvious hepatosplenomegaly  Skin:  No Cyanosis, Normal Skin Turgor, positive Skin Rash/ Bruise. On bilateral cheeks Lt>>Rt (secondary to fall)   Extremities: Symmetrical without obvious trauma or injury,  no effusions.  Data Review  CBC  Recent Labs Lab 12/01/15 1227 12/01/15 1235  WBC 4.0  --   HGB 13.8 15.6*  HCT 44.3 46.0  PLT 193  --   MCV 87.0  --   MCH 27.1  --   MCHC 31.2  --   RDW 13.5  --     Chemistries   Recent Labs Lab 12/01/15 1227 12/01/15 1235  NA 141 143  K 4.0 4.1  CL 106 104  CO2 27  --   GLUCOSE 102* 94  BUN 8 11  CREATININE 0.78 0.70  CALCIUM 9.7  --     CrCl cannot be calculated (Unknown ideal weight.).  No results for input(s): TSH, T4TOTAL, T3FREE, THYROIDAB in the last 72 hours.  Invalid input(s): FREET3  Coagulation profile No results for input(s): INR, PROTIME in the last 168 hours.  No results for input(s): DDIMER in the last 72 hours.  Cardiac Enzymes No results for input(s): CKMB, TROPONINI, MYOGLOBIN in the last 168 hours.  Invalid input(s): CK  Invalid input(s): POCBNP  Urinalysis    Component Value Date/Time   COLORURINE YELLOW 04/02/2015 1043   APPEARANCEUR HAZY* 04/02/2015 1043   LABSPEC  1.011 04/02/2015 1043   PHURINE 6.5 04/02/2015 1043   GLUCOSEU  NEGATIVE 04/02/2015 1043   HGBUR TRACE* 04/02/2015 1043   BILIRUBINUR NEGATIVE 04/02/2015 1043   KETONESUR NEGATIVE 04/02/2015 1043   PROTEINUR 30* 04/02/2015 1043   UROBILINOGEN 1.0 04/02/2015 1043   NITRITE NEGATIVE 04/02/2015 1043   LEUKOCYTESUR NEGATIVE 04/02/2015 1043    Imaging results:   Ct Head Wo Contrast  12/01/2015  CLINICAL DATA:  Bilateral orbital hematomas after fall. No loss of consciousness. EXAM: CT HEAD WITHOUT CONTRAST TECHNIQUE: Contiguous axial images were obtained from the base of the skull through the vertex without intravenous contrast. COMPARISON:  CT scan of September 29, 2013. FINDINGS: Bony calvarium appears intact. Encephalomalacia is again noted in the right MCA territory consistent with old infarction. No mass effect or midline shift is noted. Ventricular size is within normal limits. There is no evidence of mass lesion, hemorrhage or acute infarction. IMPRESSION: Old right MCA infarction.  No acute intracranial abnormality seen. Electronically Signed   By: Lupita Raider, M.D.   On: 12/01/2015 14:14   Dg Chest Portable 1 View  12/01/2015  CLINICAL DATA:  RIGHT-sided chest pain and arm pain for unknown period of time, coronary artery disease, hypertension, prior stroke EXAM: PORTABLE CHEST 1 VIEW COMPARISON:  Portable exam 1216 hours compared to 04/02/2015 FINDINGS: Upper normal heart size post median sternotomy. Mediastinal contours and pulmonary vascularity normal. Lungs clear. No pleural effusion or pneumothorax. Bones unremarkable. IMPRESSION: No acute abnormalities. Electronically Signed   By: Ulyses Southward M.D.   On: 12/01/2015 12:50   Ct Angio Chest Aorta W/cm &/or Wo/cm  12/01/2015  CLINICAL DATA:  Larey Seat Monday, bilateral orbital hematomas, abrasion to left zygoma. Patient describes right axillary pain extending into back. Patient on blood thinners. EXAM: CT ANGIOGRAPHY CHEST, ABDOMEN AND PELVIS  TECHNIQUE: Multidetector CT imaging through the chest, abdomen and pelvis was performed using the standard protocol during bolus administration of intravenous contrast. Multiplanar reconstructed images and MIPs were obtained and reviewed to evaluate the vascular anatomy. CONTRAST:  100 cc Isovue 370 COMPARISON:  None. FINDINGS: CTA CHEST FINDINGS Thoracic aorta appears intact and normal in configuration. No aneurysm or dissection. Scattered atherosclerotic changes seen along the walls of the thoracic aorta. No pulmonary embolism identified. Heart size is upper normal. Patient is status post median sternotomy for presumed CABG. Diffuse coronary artery calcifications are noted. No pericardial effusion. No pulmonary infiltrate, mass or effusion. No pneumothorax. Trachea and central bronchi are unremarkable. No masses or enlarged lymph nodes seen within the mediastinum or perihilar regions. No acute or suspicious osseous lesion. No rib fracture or displacement. Mild degenerative spurring noted throughout the scoliotic thoracic spine. Review of the MIP images confirms the above findings. CTA ABDOMEN AND PELVIS FINDINGS Atherosclerotic changes are seen along the walls of the normal-caliber abdominal aorta, at the origins of the aortic branch vessels and throughout the pelvic vasculature. No arterial occlusion seen. No aneurysm or dissection. Liver, gallbladder, spleen, pancreas and adrenal glands appear normal. Areas of scarring noted within each renal cortex. No renal stone or hydronephrosis. No ureteral or bladder calculi identified. There is common bile duct dilatation measuring 12 mm diameter. No bile duct stone seen. No significant intrahepatic bile duct dilatation. Bowel is normal in caliber. No bowel wall thickening or evidence of bowel wall inflammation seen. Moderate amount of stool and gas throughout the colon. Appendix is not seen but there are no inflammatory changes about the cecum to suggest acute  appendicitis. Small free fluid in the lower pelvis. No other free fluid seen. No abscess collection.  No free intraperitoneal air. There are 4 lumbar type vertebral bodies. Based on this numbering, there is a compression fracture deformity of the L1 vertebral body which is of uncertain age but favored to be chronic. No acute-appearing osseous abnormality seen. Review of the MIP images confirms the above findings. IMPRESSION: 1. No acute- appearing abnormality of the thoracic or abdominal aorta. No aortic aneurysm or dissection. No evidence of traumatic aortic injury. 2. Overall, no evidence of acute intrathoracic abnormality. Chronic findings detailed above. 3. Common bile duct dilatation measuring up to 12 mm diameter. No obstructing bile duct stone identified. Recommend correlation with liver function tests. If abnormal, would consider MRCP for further characterization. 4. Compression fracture deformity of the L1 vertebral body, approximately 20% compressed anteriorly, of uncertain age but favored to be chronic. 5. Additional chronic/incidental findings detailed above. Electronically Signed   By: Bary Richard M.D.   On: 12/01/2015 14:34   Ct Cta Abd/pel W/cm &/or W/o Cm  12/01/2015  CLINICAL DATA:  Larey Seat Monday, bilateral orbital hematomas, abrasion to left zygoma. Patient describes right axillary pain extending into back. Patient on blood thinners. EXAM: CT ANGIOGRAPHY CHEST, ABDOMEN AND PELVIS TECHNIQUE: Multidetector CT imaging through the chest, abdomen and pelvis was performed using the standard protocol during bolus administration of intravenous contrast. Multiplanar reconstructed images and MIPs were obtained and reviewed to evaluate the vascular anatomy. CONTRAST:  100 cc Isovue 370 COMPARISON:  None. FINDINGS: CTA CHEST FINDINGS Thoracic aorta appears intact and normal in configuration. No aneurysm or dissection. Scattered atherosclerotic changes seen along the walls of the thoracic aorta. No pulmonary  embolism identified. Heart size is upper normal. Patient is status post median sternotomy for presumed CABG. Diffuse coronary artery calcifications are noted. No pericardial effusion. No pulmonary infiltrate, mass or effusion. No pneumothorax. Trachea and central bronchi are unremarkable. No masses or enlarged lymph nodes seen within the mediastinum or perihilar regions. No acute or suspicious osseous lesion. No rib fracture or displacement. Mild degenerative spurring noted throughout the scoliotic thoracic spine. Review of the MIP images confirms the above findings. CTA ABDOMEN AND PELVIS FINDINGS Atherosclerotic changes are seen along the walls of the normal-caliber abdominal aorta, at the origins of the aortic branch vessels and throughout the pelvic vasculature. No arterial occlusion seen. No aneurysm or dissection. Liver, gallbladder, spleen, pancreas and adrenal glands appear normal. Areas of scarring noted within each renal cortex. No renal stone or hydronephrosis. No ureteral or bladder calculi identified. There is common bile duct dilatation measuring 12 mm diameter. No bile duct stone seen. No significant intrahepatic bile duct dilatation. Bowel is normal in caliber. No bowel wall thickening or evidence of bowel wall inflammation seen. Moderate amount of stool and gas throughout the colon. Appendix is not seen but there are no inflammatory changes about the cecum to suggest acute appendicitis. Small free fluid in the lower pelvis. No other free fluid seen. No abscess collection. No free intraperitoneal air. There are 4 lumbar type vertebral bodies. Based on this numbering, there is a compression fracture deformity of the L1 vertebral body which is of uncertain age but favored to be chronic. No acute-appearing osseous abnormality seen. Review of the MIP images confirms the above findings. IMPRESSION: 1. No acute- appearing abnormality of the thoracic or abdominal aorta. No aortic aneurysm or dissection. No  evidence of traumatic aortic injury. 2. Overall, no evidence of acute intrathoracic abnormality. Chronic findings detailed above. 3. Common bile duct dilatation measuring up to 12 mm diameter. No obstructing bile  duct stone identified. Recommend correlation with liver function tests. If abnormal, would consider MRCP for further characterization. 4. Compression fracture deformity of the L1 vertebral body, approximately 20% compressed anteriorly, of uncertain age but favored to be chronic. 5. Additional chronic/incidental findings detailed above. Electronically Signed   By: Bary Richard M.D.   On: 12/01/2015 14:34     EKG: (Independently reviewed)  Sinus rhythm with ventricular rate 60 bpm, QTC 412 ms, voltage criteria for LVH, downsloping ST segments in the inferior leads unchanged from previous; downsloping ST segments with T-wave inversions in V4 through V6 unchanged from previous EKG, upsloping ST elevation in V1 and V2 and subtly in V3 again unchanged from previous EKGs.   Assessment & Plan  Principal Problem:   Chest pain/history of CABG in the 1990s -Has both some typical and atypical features with most concerning feature being pain relieved by nitroglycerin but this is also a nonspecific finding -More reassuring is initial normal EKG and troponin; patient reports has not had recent issues with recurring chest discomfort until today noting symptoms did occur after patient experienced a fall this past week with likely musculoskeletal injuries -Based on history we'll tentatively give a heart score of 6 -Admit to telemetry/Obs -Due to patient's dysmotility and history of CABG have consulted cardiology in the event they determine symptoms are concerning for possible ischemic etiology and would require cardiac catheterization this admission although typically patients are only catheterized urgently if they have an increase in troponin -Continue to cycle troponin -Check echocardiogram -Patient has  not followed up with a cardiologist in the past 3 years since returning to the [place] area; appreciate current cardiology consultation and would like for patient to in the minimum be scheduled with follow-up appointment after discharge for annual monitoring if indicated -Check fasting lipid panel -Continue aspirin, Plavix and beta blocker as well as statin  Active Problems:   HTN  -At presentation systolic blood pressure was 200 but this may be related to pain and anxiety noting blood pressure markedly improved after administration of nitroglycerin -Toprol 50 mg daily   -Increase Lisinopril to 30 mg QHS -Hydralazine 10 mg PRN SBP> 150    Hemiparesis and alteration of sensations as late effects of stroke -Last evaluation at neurology office was on 3/17    Fall at home -Described his mechanical -No recent issues otherwise and patient states she tripped over her foot and typically always ambulates with assistance with her cane    Common bile duct dilatation -No intrahepatic dilatation and no evidence of cholelithiasis on CT -Patient does not report prandial or postprandial abdominal pain nausea or reflux symptoms and has no pain on exam -Check LFTs and if elevated consider MRCP for evaluation of biliary ductal system    Seizure disorder  -No reported recent seizures -Followed with OP neurology as above -Continue Lamictal    Compression fracture of L1 lumbar vertebra  -No reports of chronic back pain    DVT Prophylaxis: Lovenox  Family Communication:  Daughter at bedside   Code Status:  DO NOT RESUSCITATE  Condition: Stable   Discharge disposition: Once medically stable anticipate discharge back to previous home environment  Time spent in minutes : 60      ELLIS,ALLISON L. ANP on 12/01/2015 at 4:59 PM  You may contact me by going to [email or web address] - password [email or web address]  I am available from 7a-7p but please confirm I am on the schedule by going to Amion as above.   After  7p please  contact night coverage person covering me after hours  [facility] during the described time interval was provided by myself and ANP Junious Silk .  I have reviewed this patient's available data, including medical history, events of note, physical examination, and all test results as part of my evaluation. I have personally reviewed and interpreted all radiology studies.   Carolyne Littles, MD [phone] Pager

## 2015-12-01 NOTE — Consult Note (Signed)
CARDIOLOGY CONSULT NOTE  Referring Physician: Joseph ArtWoods Primary Cardiologist: in [place] Reason for Consultation: Chest pain   HPI:  [name] [name] is a 75 y.o. female hx of CAD s/p CABG 1997, stroke with L sided residual weakness (2010), HTN, HL and seizure disorder.    She moved here from [place] 3 years ago to be with her daughter and has no cardiologist here. Denies any problems with her heart since her CABG.  Several days ago she tripped and hit her left side and face and sustained left facial laceration and bilateral black eyes.   Woke up this am with epigastric and lower chest pain. Radiating to left arm and back. + diaphoresis. Has not had pain like this before. Took ASA without relief. In ER CP relieved with NTG . Troponin 0.01. ECG with LVH and repol. Had CT chest and abdomen. No PE or dissection. + common bile duct dilation without stone currently. She does ambulate at home but is limited due to CVA. No exertional CP/SOB. Pain free now.    Had CT chest and abdomen in ER:  IMPRESSION: 1. No acute- appearing abnormality of the thoracic or abdominal aorta. No aortic aneurysm or dissection. No evidence of traumatic aortic injury. 2. Overall, no evidence of acute intrathoracic abnormality. Chronic findings detailed above. 3. Common bile duct dilatation measuring up to 12 mm diameter. No obstructing bile duct stone identified. Recommend correlation with liver function tests. If abnormal, would consider MRCP for further characterization. 4. Compression fracture deformity of the L1 vertebral body, approximately 20% compressed anteriorly, of uncertain age but favored to be chronic. 5. Additional chronic/incidental findings detailed above.  Review of Systems:     Cardiac Review of Systems: {Y] = yes [ ]  = no  Chest Pain [  y  ]  Resting SOB [   ] Exertional SOB  [  ]  Orthopnea [  ]   Pedal Edema [   ]    Palpitations [  ] Syncope  [  ]   Presyncope [   ]  General Review of  Systems: [Y] = yes [  ]=no Constitional: recent weight change [  ]; anorexia [  ]; fatigue [  ]; nausea [  ]; night sweats [  ]; fever [  ]; or chills [  ];                                                                     Eyes : blurred vision [  ]; diplopia [   ]; vision changes [  ];  Amaurosis fugax[  ]; Resp: cough [  ];  wheezing[  ];  hemoptysis[  ];  PND [  ];  GI:  gallstones[  ], vomiting[  ];  dysphagia[  ]; melena[  ];  hematochezia [  ]; heartburn[  ];   GU: kidney stones [  ]; hematuria[  ];   dysuria [  ];  nocturia[  ]; incontinence [  ];             Skin: rash, swelling[  ];, hair loss[  ];  peripheral edema[  ];  or itching[  ]; Musculosketetal: myalgias[  ];  joint swelling[  ];  joint erythema[  ];  joint pain[ y ];  back pain[  ];  Heme/Lymph: bruising[  ];  bleeding[  ];  anemia[  ];  Neuro: TIA[  ];  headaches[  ];  stroke[ y ];  vertigo[  ];  seizures[ y ];   paresthesias[  ];  difficulty walking[  ];  Psych:depression[  ]; anxiety[  ];  Endocrine: diabetes[  ];  thyroid dysfunction[  ];  Other:  Past Medical History  Diagnosis Date  . CAD (coronary artery disease)   . Stroke (HCC)   . Hypertension   . Hypercholesterolemia   . Peroneal DVT (deep venous thrombosis) (HCC)   . Osteoporosis   . Seizures (HCC)   . Post-menopausal   . Heart attack (HCC)   . Peripheral vascular disease (HCC)     Left superficial femoral artery  . Hemiparesis and alteration of sensations as late effects of stroke (HCC) 03/14/2013  . Headache(784.0) 03/14/2013  . Cervical spine fracture [facility])     Not requiring surgery    Prior to Admission medications   Medication Sig Start Date End Date Taking? Authorizing Provider  acetaminophen (TYLENOL) 500 MG tablet Take 500-1,000 mg by mouth every 8 (eight) hours as needed for mild pain.   Yes Historical Provider, MD  aspirin EC 81 MG tablet Take 1 tablet (81 mg total) by mouth daily. 02/02/14  Yes Ronal Fear, NP  atorvastatin (LIPITOR) 20 MG  tablet Take 20 mg by mouth every evening.    Yes Historical Provider, MD  cholecalciferol (VITAMIN D) 1000 UNITS tablet Take 1,000 Units by mouth daily.   Yes Historical Provider, MD  clopidogrel (PLAVIX) 75 MG tablet Take 75 mg by mouth daily. 03/18/15  Yes Historical Provider, MD  lamoTRIgine (LAMICTAL) 100 MG tablet TAKE 1 TABLET(100 MG) BY MOUTH TWICE DAILY 10/24/15  Yes York Spaniel, MD  lisinopril (PRINIVIL,ZESTRIL) 20 MG tablet Take 20 mg by mouth daily.   Yes Historical Provider, MD  metoprolol succinate (TOPROL-XL) 50 MG 24 hr tablet Take 50 mg by mouth daily. Take with or immediately following a meal.   Yes Historical Provider, MD     (Not in a hospital admission)      Infusions:    Allergies  Allergen Reactions  . Latex Rash  . Penicillins Rash    Has patient had a PCN reaction causing immediate rash, facial/tongue/throat swelling, SOB or lightheadedness with hypotension: No Has patient had a PCN reaction causing severe rash involving mucus membranes or skin necrosis: No Has patient had a PCN reaction that required hospitalization No Has patient had a PCN reaction occurring within the last 10 years: No If all of the above answers are "NO", then may proceed with Cephalosporin use.  . Tape Rash    Social History   Social History  . Marital Status: Widowed    Spouse Name: N/A  . Number of Children: 5  . Years of Education: 10   Occupational History  . Retired    Social History Main Topics  . Smoking status: Never Smoker   . Smokeless tobacco: Never Used  . Alcohol Use: No  . Drug Use: No  . Sexual Activity: Not on file   Other Topics Concern  . Not on file   Social History Narrative   Patient is a widowed and lives with her daughter [name].   Patient has three living children and two are deceased.   Patient is retired.   Patient has a 10th grade education.   Patient  is right-handed.   Patient occasionally drinks caffeine.          Family  History  Problem Relation Age of Onset  . Heart attack Mother   . Heart attack Father   . Heart attack Sister   . Heart attack Brother     PHYSICAL EXAM: Filed Vitals:   12/01/15 1430 12/01/15 1556  BP: 147/68 164/71  Pulse: 57 64  Temp:  98.1 F (36.7 C)  Resp: 19 21    No intake or output data in the 24 hours ending 12/01/15 1615  General:  Elderly. No respiratory difficulty HEENT: normal x for bilateral black eyes and L facial abrasion  Neck: supple. JVP 7. Carotids 2+ bilat; no bruits. No lymphadenopathy or thryomegaly appreciated. Cor: PMI nondisplaced. Regular rate & rhythm. No rubs, gallops or murmurs. Lungs: clear Abdomen: soft, nontender, nondistended. No hepatosplenomegaly. No bruits or masses. Good bowel sounds. Extremities: no cyanosis, clubbing, rash, edema Neuro: alert & oriented x 3, cranial nerves grossly intact. Weak on left Affect pleasant.  ECG: NSR with LVH and repol abnormalities   Results for orders placed or performed during the hospital encounter of 12/01/15 (from the past 24 hour(s))  Basic metabolic panel     Status: Abnormal   Collection Time: 12/01/15 12:27 PM  Result Value Ref Range   Sodium 141 135 - 145 mmol/L   Potassium 4.0 3.5 - 5.1 mmol/L   Chloride 106 101 - 111 mmol/L   CO2 27 22 - 32 mmol/L   Glucose, Bld 102 (H) 65 - 99 mg/dL   BUN 8 6 - 20 mg/dL   Creatinine, Ser 0.27 0.44 - 1.00 mg/dL   Calcium 9.7 8.9 - 25.3 mg/dL   GFR calc non Af Amer >60 >60 mL/min   GFR calc Af Amer >60 >60 mL/min   Anion gap 8 5 - 15  CBC     Status: None   Collection Time: 12/01/15 12:27 PM  Result Value Ref Range   WBC 4.0 4.0 - 10.5 K/uL   RBC 5.09 3.87 - 5.11 MIL/uL   Hemoglobin 13.8 12.0 - 15.0 g/dL   HCT 66.4 40.3 - 47.4 %   MCV 87.0 78.0 - 100.0 fL   MCH 27.1 26.0 - 34.0 pg   MCHC 31.2 30.0 - 36.0 g/dL   RDW 25.9 56.3 - 87.5 %   Platelets 193 150 - 400 K/uL  I-stat troponin, ED (not at [facility], [facility])     Status: None   Collection Time:  12/01/15 12:33 PM  Result Value Ref Range   Troponin i, poc 0.01 0.00 - 0.08 ng/mL   Comment 3          I-stat chem 8, ed     Status: Abnormal   Collection Time: 12/01/15 12:35 PM  Result Value Ref Range   Sodium 143 135 - 145 mmol/L   Potassium 4.1 3.5 - 5.1 mmol/L   Chloride 104 101 - 111 mmol/L   BUN 11 6 - 20 mg/dL   Creatinine, Ser 6.43 0.44 - 1.00 mg/dL   Glucose, Bld 94 65 - 99 mg/dL   Calcium, Ion 3.29 5.18 - 1.30 mmol/L   TCO2 28 0 - 100 mmol/L   Hemoglobin 15.6 (H) 12.0 - 15.0 g/dL   HCT 84.1 66.0 - 63.0 %   Ct Head Wo Contrast  12/01/2015  CLINICAL DATA:  Bilateral orbital hematomas after fall. No loss of consciousness. EXAM: CT HEAD WITHOUT CONTRAST TECHNIQUE: Contiguous axial images were  obtained from the base of the skull through the vertex without intravenous contrast. COMPARISON:  CT scan of September 29, 2013. FINDINGS: Bony calvarium appears intact. Encephalomalacia is again noted in the right MCA territory consistent with old infarction. No mass effect or midline shift is noted. Ventricular size is within normal limits. There is no evidence of mass lesion, hemorrhage or acute infarction. IMPRESSION: Old right MCA infarction.  No acute intracranial abnormality seen. Electronically Signed   By: Lupita Raider, M.D.   On: 12/01/2015 14:14   Dg Chest Portable 1 View  12/01/2015  CLINICAL DATA:  RIGHT-sided chest pain and arm pain for unknown period of time, coronary artery disease, hypertension, prior stroke EXAM: PORTABLE CHEST 1 VIEW COMPARISON:  Portable exam 1216 hours compared to 04/02/2015 FINDINGS: Upper normal heart size post median sternotomy. Mediastinal contours and pulmonary vascularity normal. Lungs clear. No pleural effusion or pneumothorax. Bones unremarkable. IMPRESSION: No acute abnormalities. Electronically Signed   By: Ulyses Southward M.D.   On: 12/01/2015 12:50   Ct Angio Chest Aorta W/cm &/or Wo/cm  12/01/2015  CLINICAL DATA:  Larey Seat Monday, bilateral orbital  hematomas, abrasion to left zygoma. Patient describes right axillary pain extending into back. Patient on blood thinners. EXAM: CT ANGIOGRAPHY CHEST, ABDOMEN AND PELVIS TECHNIQUE: Multidetector CT imaging through the chest, abdomen and pelvis was performed using the standard protocol during bolus administration of intravenous contrast. Multiplanar reconstructed images and MIPs were obtained and reviewed to evaluate the vascular anatomy. CONTRAST:  100 cc Isovue 370 COMPARISON:  None. FINDINGS: CTA CHEST FINDINGS Thoracic aorta appears intact and normal in configuration. No aneurysm or dissection. Scattered atherosclerotic changes seen along the walls of the thoracic aorta. No pulmonary embolism identified. Heart size is upper normal. Patient is status post median sternotomy for presumed CABG. Diffuse coronary artery calcifications are noted. No pericardial effusion. No pulmonary infiltrate, mass or effusion. No pneumothorax. Trachea and central bronchi are unremarkable. No masses or enlarged lymph nodes seen within the mediastinum or perihilar regions. No acute or suspicious osseous lesion. No rib fracture or displacement. Mild degenerative spurring noted throughout the scoliotic thoracic spine. Review of the MIP images confirms the above findings. CTA ABDOMEN AND PELVIS FINDINGS Atherosclerotic changes are seen along the walls of the normal-caliber abdominal aorta, at the origins of the aortic branch vessels and throughout the pelvic vasculature. No arterial occlusion seen. No aneurysm or dissection. Liver, gallbladder, spleen, pancreas and adrenal glands appear normal. Areas of scarring noted within each renal cortex. No renal stone or hydronephrosis. No ureteral or bladder calculi identified. There is common bile duct dilatation measuring 12 mm diameter. No bile duct stone seen. No significant intrahepatic bile duct dilatation. Bowel is normal in caliber. No bowel wall thickening or evidence of bowel wall  inflammation seen. Moderate amount of stool and gas throughout the colon. Appendix is not seen but there are no inflammatory changes about the cecum to suggest acute appendicitis. Small free fluid in the lower pelvis. No other free fluid seen. No abscess collection. No free intraperitoneal air. There are 4 lumbar type vertebral bodies. Based on this numbering, there is a compression fracture deformity of the L1 vertebral body which is of uncertain age but favored to be chronic. No acute-appearing osseous abnormality seen. Review of the MIP images confirms the above findings. IMPRESSION: 1. No acute- appearing abnormality of the thoracic or abdominal aorta. No aortic aneurysm or dissection. No evidence of traumatic aortic injury. 2. Overall, no evidence of acute intrathoracic abnormality.  Chronic findings detailed above. 3. Common bile duct dilatation measuring up to 12 mm diameter. No obstructing bile duct stone identified. Recommend correlation with liver function tests. If abnormal, would consider MRCP for further characterization. 4. Compression fracture deformity of the L1 vertebral body, approximately 20% compressed anteriorly, of uncertain age but favored to be chronic. 5. Additional chronic/incidental findings detailed above. Electronically Signed   By: Bary Richard M.D.   On: 12/01/2015 14:34   Ct Cta Abd/pel W/cm &/or W/o Cm  12/01/2015  CLINICAL DATA:  Larey Seat Monday, bilateral orbital hematomas, abrasion to left zygoma. Patient describes right axillary pain extending into back. Patient on blood thinners. EXAM: CT ANGIOGRAPHY CHEST, ABDOMEN AND PELVIS TECHNIQUE: Multidetector CT imaging through the chest, abdomen and pelvis was performed using the standard protocol during bolus administration of intravenous contrast. Multiplanar reconstructed images and MIPs were obtained and reviewed to evaluate the vascular anatomy. CONTRAST:  100 cc Isovue 370 COMPARISON:  None. FINDINGS: CTA CHEST FINDINGS Thoracic  aorta appears intact and normal in configuration. No aneurysm or dissection. Scattered atherosclerotic changes seen along the walls of the thoracic aorta. No pulmonary embolism identified. Heart size is upper normal. Patient is status post median sternotomy for presumed CABG. Diffuse coronary artery calcifications are noted. No pericardial effusion. No pulmonary infiltrate, mass or effusion. No pneumothorax. Trachea and central bronchi are unremarkable. No masses or enlarged lymph nodes seen within the mediastinum or perihilar regions. No acute or suspicious osseous lesion. No rib fracture or displacement. Mild degenerative spurring noted throughout the scoliotic thoracic spine. Review of the MIP images confirms the above findings. CTA ABDOMEN AND PELVIS FINDINGS Atherosclerotic changes are seen along the walls of the normal-caliber abdominal aorta, at the origins of the aortic branch vessels and throughout the pelvic vasculature. No arterial occlusion seen. No aneurysm or dissection. Liver, gallbladder, spleen, pancreas and adrenal glands appear normal. Areas of scarring noted within each renal cortex. No renal stone or hydronephrosis. No ureteral or bladder calculi identified. There is common bile duct dilatation measuring 12 mm diameter. No bile duct stone seen. No significant intrahepatic bile duct dilatation. Bowel is normal in caliber. No bowel wall thickening or evidence of bowel wall inflammation seen. Moderate amount of stool and gas throughout the colon. Appendix is not seen but there are no inflammatory changes about the cecum to suggest acute appendicitis. Small free fluid in the lower pelvis. No other free fluid seen. No abscess collection. No free intraperitoneal air. There are 4 lumbar type vertebral bodies. Based on this numbering, there is a compression fracture deformity of the L1 vertebral body which is of uncertain age but favored to be chronic. No acute-appearing osseous abnormality seen. Review  of the MIP images confirms the above findings. IMPRESSION: 1. No acute- appearing abnormality of the thoracic or abdominal aorta. No aortic aneurysm or dissection. No evidence of traumatic aortic injury. 2. Overall, no evidence of acute intrathoracic abnormality. Chronic findings detailed above. 3. Common bile duct dilatation measuring up to 12 mm diameter. No obstructing bile duct stone identified. Recommend correlation with liver function tests. If abnormal, would consider MRCP for further characterization. 4. Compression fracture deformity of the L1 vertebral body, approximately 20% compressed anteriorly, of uncertain age but favored to be chronic. 5. Additional chronic/incidental findings detailed above. Electronically Signed   By: Bary Richard M.D.   On: 12/01/2015 14:34     ASSESSMENT: 1. Chest pain 2. CAD s/p CABG 1997 3. CVA with residual left-sided weakness.  4. Recent fall  with facial abrasion 5. HTN 6. HL 7. Dilated CBD on CT  PLAN/DISCUSSION:  Chest pain with typical and atypical features in setting of 75 y/o CABG grafts. Initial troponin and ECG negative despite significant discomfort. Will cycle cardiac markers. If CP recurs or troponin elevated will need cath otherwise can plan Lexiscan Myoview. CT scan raises question of passed CBD stone which can also cause her symptoms. Will check LFTs/amylase/lipase. Check RUQ u/s. Continue ASA. Plavix, b-blocker, statin. No heparin unless pain recurs.     Debbie Bellucci,MD 4:54 PM

## 2015-12-01 NOTE — Progress Notes (Signed)
Questioned code status of pt and per pt she, "Wants to live as [place] as she can", and requests to be Full Code. Daughter at bedside is POA and asks code status to be changed. Junious SilkAllison Ellis, PA notified of pt and daughter's request. Code status changed to Full Code.

## 2015-12-01 NOTE — ED Notes (Signed)
Per [facility], pt from home, c/o R armpit pain that travels to the back, pt slept on L side, states shes not supposed to sleep on the L side. Pt c/o pain in R arm and across back. 12 and 15 lead unremarkable by ems. Pt also fell Monday and bruised the L side of her face, abraision to L side of face. Pt denies LOC. Pt did not see a doctor for fall. Pt on blood thinners. AAOX4, in NAD. Hypertensive.

## 2015-12-02 ENCOUNTER — Observation Stay (HOSPITAL_BASED_OUTPATIENT_CLINIC_OR_DEPARTMENT_OTHER): Payer: Medicare PPO

## 2015-12-02 ENCOUNTER — Observation Stay (HOSPITAL_COMMUNITY): Payer: Medicare PPO

## 2015-12-02 DIAGNOSIS — R079 Chest pain, unspecified: Secondary | ICD-10-CM | POA: Diagnosis not present

## 2015-12-02 DIAGNOSIS — S32010A Wedge compression fracture of first lumbar vertebra, initial encounter for closed fracture: Secondary | ICD-10-CM | POA: Diagnosis not present

## 2015-12-02 DIAGNOSIS — I2510 Atherosclerotic heart disease of native coronary artery without angina pectoris: Secondary | ICD-10-CM | POA: Diagnosis not present

## 2015-12-02 DIAGNOSIS — R0789 Other chest pain: Principal | ICD-10-CM

## 2015-12-02 DIAGNOSIS — K838 Other specified diseases of biliary tract: Secondary | ICD-10-CM | POA: Diagnosis not present

## 2015-12-02 LAB — NM MYOCAR MULTI W/SPECT W/WALL MOTION / EF
CHL CUP MPHR: 148 {beats}/min
CHL RATE OF PERCEIVED EXERTION: 0
CSEPEW: 1 METS
CSEPHR: 77 %
CSEPPHR: 114 {beats}/min
Exercise duration (min): 0 min
Exercise duration (sec): 0 s
Rest HR: 69 {beats}/min

## 2015-12-02 LAB — LIPID PANEL
Cholesterol: 153 mg/dL (ref 0–200)
HDL: 41 mg/dL (ref >40)
LDL Cholesterol: 101 mg/dL — ABNORMAL HIGH (ref 0–99)
Total CHOL/HDL Ratio: 3.7 RATIO
Triglycerides: 57 mg/dL (ref <150)
VLDL: 11 mg/dL (ref 0–40)

## 2015-12-02 LAB — TROPONIN I

## 2015-12-02 LAB — ECHOCARDIOGRAM COMPLETE

## 2015-12-02 MED ORDER — REGADENOSON 0.4 MG/5ML IV SOLN
0.4000 mg | Freq: Once | INTRAVENOUS | Status: AC
  Administered 2015-12-02: 0.4 mg via INTRAVENOUS
  Filled 2015-12-02: qty 5

## 2015-12-02 MED ORDER — TECHNETIUM TC 99M SESTAMIBI GENERIC - CARDIOLITE
10.0000 | Freq: Once | INTRAVENOUS | Status: AC | PRN
  Administered 2015-12-02: 10 via INTRAVENOUS

## 2015-12-02 MED ORDER — ONDANSETRON HCL 4 MG/2ML IJ SOLN
INTRAMUSCULAR | Status: AC
  Filled 2015-12-02: qty 2

## 2015-12-02 MED ORDER — TECHNETIUM TC 99M SESTAMIBI GENERIC - CARDIOLITE
30.0000 | Freq: Once | INTRAVENOUS | Status: AC | PRN
  Administered 2015-12-02: 30 via INTRAVENOUS

## 2015-12-02 MED ORDER — REGADENOSON 0.4 MG/5ML IV SOLN
INTRAVENOUS | Status: AC
  Filled 2015-12-02: qty 5

## 2015-12-02 NOTE — Progress Notes (Signed)
Message sent to Dr Sullivan LoneGilbert concerning [name] [name]'s episode of mid sternal chest pain that radiated across chest to R arm 9/10.  NTG 1/150 administer SL x2. EKG was obtained. Pt had brief episode of hypotension due to the NTG. Also made aware of + troponin 0.05. Will continue to monitor closely.

## 2015-12-02 NOTE — Care Management Obs Status (Signed)
MEDICARE OBSERVATION STATUS NOTIFICATION   Patient Details  Name: [name] [name] MRN: [id number] Date of Birth: 05-12-1941   Medicare Observation Status Notification Given:  Yes    Gala LewandowskyGraves-Bigelow, Korianna Washer Kaye, RN 12/02/2015, 4:12 PM

## 2015-12-02 NOTE — Progress Notes (Signed)
Nuc study with defect no ischemia but deceased EF- Echo pending will need to stay tonight and decide tomorrow based on Echo on plan.  Pt aware.

## 2015-12-02 NOTE — Progress Notes (Signed)
Patient ID: [name] [name], female   DOB: Jul 17, 1941, 75 y.o.   MRN: [id number]    Subjective:  Headache wants to eat no chest pain   Objective:  Filed Vitals:   12/02/15 0000 12/02/15 0200 12/02/15 0400 12/02/15 0959  BP: 136/71 123/56 117/49 133/57  Pulse: 64 70 71 69  Temp: 98.5 F (36.9 C)  98.5 F (36.9 C)   TempSrc: Oral  Oral   Resp: SpO2: 99% 99% 98%     Intake/Output from previous day: No intake or output data in the 24 hours ending 12/02/15 1026  Physical Exam: Affect appropriate Chronically ill black female  HEENT: normal Neck supple with no adenopathy JVP normal no bruits no thyromegaly Lungs clear with no wheezing and good diaphragmatic motion Heart:  S1/S2 no murmur, no rub, gallop or click PMI normal Abdomen: benighn, BS positve, no tenderness, no AAA no bruit.  No HSM or HJR Distal pulses intact with no bruits No edema LUE paralyzed and contracted LLE weak Walks with brace    Lab Results: Basic Metabolic Panel:  Recent Labs  40/98/11 1227 12/01/15 1235  NA 141 143  K 4.0 4.1  CL 106 104  CO2 27  --   GLUCOSE 102* 94  BUN 8 11  CREATININE 0.78 0.70  CALCIUM 9.7  --    Liver Function Tests:  Recent Labs  12/01/15 1634  AST 24  ALT 22  ALKPHOS 77  BILITOT 0.5  PROT 7.0  ALBUMIN 3.4*    Recent Labs  12/01/15 2137  LIPASE 39  AMYLASE 102*   CBC:  Recent Labs  12/01/15 1227 12/01/15 1235  WBC 4.0  --   HGB 13.8 15.6*  HCT 44.3 46.0  MCV 87.0  --   PLT 193  --    Cardiac Enzymes:  Recent Labs  12/01/15 1634 12/01/15 2137 12/02/15 0318  TROPONINI <0.03 0.05* <0.03   Fasting Lipid Panel:  Recent Labs  12/02/15 0318  CHOL 153  HDL 41  LDLCALC 101*  TRIG 57  CHOLHDL 3.7    Imaging: Ct Head Wo Contrast  12/01/2015  CLINICAL DATA:  Bilateral orbital hematomas after fall. No loss of consciousness. EXAM: CT HEAD WITHOUT CONTRAST TECHNIQUE: Contiguous axial images were obtained from the base of the  skull through the vertex without intravenous contrast. COMPARISON:  CT scan of September 29, 2013. FINDINGS: Bony calvarium appears intact. Encephalomalacia is again noted in the right MCA territory consistent with old infarction. No mass effect or midline shift is noted. Ventricular size is within normal limits. There is no evidence of mass lesion, hemorrhage or acute infarction. IMPRESSION: Old right MCA infarction.  No acute intracranial abnormality seen. Electronically Signed   By: Lupita Raider, M.D.   On: 12/01/2015 14:14   Dg Chest Portable 1 View  12/01/2015  CLINICAL DATA:  RIGHT-sided chest pain and arm pain for unknown period of time, coronary artery disease, hypertension, prior stroke EXAM: PORTABLE CHEST 1 VIEW COMPARISON:  Portable exam 1216 hours compared to 04/02/2015 FINDINGS: Upper normal heart size post median sternotomy. Mediastinal contours and pulmonary vascularity normal. Lungs clear. No pleural effusion or pneumothorax. Bones unremarkable. IMPRESSION: No acute abnormalities. Electronically Signed   By: Ulyses Southward M.D.   On: 12/01/2015 12:50   Ct Angio Chest Aorta W/cm &/or Wo/cm  12/01/2015  CLINICAL DATA:  Larey Seat Monday, bilateral orbital hematomas, abrasion to left zygoma. Patient describes right axillary pain extending into back. Patient  on blood thinners. EXAM: CT ANGIOGRAPHY CHEST, ABDOMEN AND PELVIS TECHNIQUE: Multidetector CT imaging through the chest, abdomen and pelvis was performed using the standard protocol during bolus administration of intravenous contrast. Multiplanar reconstructed images and MIPs were obtained and reviewed to evaluate the vascular anatomy. CONTRAST:  100 cc Isovue 370 COMPARISON:  None. FINDINGS: CTA CHEST FINDINGS Thoracic aorta appears intact and normal in configuration. No aneurysm or dissection. Scattered atherosclerotic changes seen along the walls of the thoracic aorta. No pulmonary embolism identified. Heart size is upper normal. Patient is status  post median sternotomy for presumed CABG. Diffuse coronary artery calcifications are noted. No pericardial effusion. No pulmonary infiltrate, mass or effusion. No pneumothorax. Trachea and central bronchi are unremarkable. No masses or enlarged lymph nodes seen within the mediastinum or perihilar regions. No acute or suspicious osseous lesion. No rib fracture or displacement. Mild degenerative spurring noted throughout the scoliotic thoracic spine. Review of the MIP images confirms the above findings. CTA ABDOMEN AND PELVIS FINDINGS Atherosclerotic changes are seen along the walls of the normal-caliber abdominal aorta, at the origins of the aortic branch vessels and throughout the pelvic vasculature. No arterial occlusion seen. No aneurysm or dissection. Liver, gallbladder, spleen, pancreas and adrenal glands appear normal. Areas of scarring noted within each renal cortex. No renal stone or hydronephrosis. No ureteral or bladder calculi identified. There is common bile duct dilatation measuring 12 mm diameter. No bile duct stone seen. No significant intrahepatic bile duct dilatation. Bowel is normal in caliber. No bowel wall thickening or evidence of bowel wall inflammation seen. Moderate amount of stool and gas throughout the colon. Appendix is not seen but there are no inflammatory changes about the cecum to suggest acute appendicitis. Small free fluid in the lower pelvis. No other free fluid seen. No abscess collection. No free intraperitoneal air. There are 4 lumbar type vertebral bodies. Based on this numbering, there is a compression fracture deformity of the L1 vertebral body which is of uncertain age but favored to be chronic. No acute-appearing osseous abnormality seen. Review of the MIP images confirms the above findings. IMPRESSION: 1. No acute- appearing abnormality of the thoracic or abdominal aorta. No aortic aneurysm or dissection. No evidence of traumatic aortic injury. 2. Overall, no evidence of  acute intrathoracic abnormality. Chronic findings detailed above. 3. Common bile duct dilatation measuring up to 12 mm diameter. No obstructing bile duct stone identified. Recommend correlation with liver function tests. If abnormal, would consider MRCP for further characterization. 4. Compression fracture deformity of the L1 vertebral body, approximately 20% compressed anteriorly, of uncertain age but favored to be chronic. 5. Additional chronic/incidental findings detailed above. Electronically Signed   By: Bary RichardStan  Maynard M.D.   On: 12/01/2015 14:34   Ct Cta Abd/pel W/cm &/or W/o Cm  12/01/2015  CLINICAL DATA:  Larey SeatFell Monday, bilateral orbital hematomas, abrasion to left zygoma. Patient describes right axillary pain extending into back. Patient on blood thinners. EXAM: CT ANGIOGRAPHY CHEST, ABDOMEN AND PELVIS TECHNIQUE: Multidetector CT imaging through the chest, abdomen and pelvis was performed using the standard protocol during bolus administration of intravenous contrast. Multiplanar reconstructed images and MIPs were obtained and reviewed to evaluate the vascular anatomy. CONTRAST:  100 cc Isovue 370 COMPARISON:  None. FINDINGS: CTA CHEST FINDINGS Thoracic aorta appears intact and normal in configuration. No aneurysm or dissection. Scattered atherosclerotic changes seen along the walls of the thoracic aorta. No pulmonary embolism identified. Heart size is upper normal. Patient is status post median sternotomy for presumed  CABG. Diffuse coronary artery calcifications are noted. No pericardial effusion. No pulmonary infiltrate, mass or effusion. No pneumothorax. Trachea and central bronchi are unremarkable. No masses or enlarged lymph nodes seen within the mediastinum or perihilar regions. No acute or suspicious osseous lesion. No rib fracture or displacement. Mild degenerative spurring noted throughout the scoliotic thoracic spine. Review of the MIP images confirms the above findings. CTA ABDOMEN AND PELVIS  FINDINGS Atherosclerotic changes are seen along the walls of the normal-caliber abdominal aorta, at the origins of the aortic branch vessels and throughout the pelvic vasculature. No arterial occlusion seen. No aneurysm or dissection. Liver, gallbladder, spleen, pancreas and adrenal glands appear normal. Areas of scarring noted within each renal cortex. No renal stone or hydronephrosis. No ureteral or bladder calculi identified. There is common bile duct dilatation measuring 12 mm diameter. No bile duct stone seen. No significant intrahepatic bile duct dilatation. Bowel is normal in caliber. No bowel wall thickening or evidence of bowel wall inflammation seen. Moderate amount of stool and gas throughout the colon. Appendix is not seen but there are no inflammatory changes about the cecum to suggest acute appendicitis. Small free fluid in the lower pelvis. No other free fluid seen. No abscess collection. No free intraperitoneal air. There are 4 lumbar type vertebral bodies. Based on this numbering, there is a compression fracture deformity of the L1 vertebral body which is of uncertain age but favored to be chronic. No acute-appearing osseous abnormality seen. Review of the MIP images confirms the above findings. IMPRESSION: 1. No acute- appearing abnormality of the thoracic or abdominal aorta. No aortic aneurysm or dissection. No evidence of traumatic aortic injury. 2. Overall, no evidence of acute intrathoracic abnormality. Chronic findings detailed above. 3. Common bile duct dilatation measuring up to 12 mm diameter. No obstructing bile duct stone identified. Recommend correlation with liver function tests. If abnormal, would consider MRCP for further characterization. 4. Compression fracture deformity of the L1 vertebral body, approximately 20% compressed anteriorly, of uncertain age but favored to be chronic. 5. Additional chronic/incidental findings detailed above. Electronically Signed   By: Bary Richard M.D.    On: 12/01/2015 14:34   US Abdomen Limited Ruq  12/01/2015  CLINICAL DATA:  Abdominal pain for 1 day. EXAM: US ABDOMEN LIMITED - RIGHT UPPER QUADRANT COMPARISON:  CT abdomen from earlier same day. FINDINGS: Gallbladder: No gallstones seen. There is no gallbladder wall thickening, pericholecystic fluid or other secondary sonographic signs of acute cholecystitis. No sonographic Murphy's sign elicited during the examination, per the sonographer. Common bile duct: Diameter: At the level of the porta hepatis, the common bile duct is upper normal at 8 mm diameter. There is more significant dilatation of the distal common bile duct measuring up to 11 mm diameter, compatible with the 12 mm measurement on earlier CT. No bile duct stone identified. No significant intrahepatic bile duct dilatation identified. Liver: No focal lesion identified. Within normal limits in parenchymal echogenicity. IMPRESSION: 1. Common bile duct dilatation, measuring up to 11 mm diameter, of uncertain etiology. No obstructing bile duct stone or mass identified on this exam or on the earlier CT. On the earlier CT, there appears to be appropriate tapering of the distal-most common bile duct. Consider correlation with liver function tests and, if abnormal, MRCP. 2. Gallbladder appears normal. No gallstones seen. No evidence of cholecystitis. Electronically Signed   By: Bary Richard M.D.   On: 12/01/2015 18:46    Cardiac Studies:  ECG:  NSR LVH nonspecific lateral ST  changes    Telemetry:  NSR 12/02/2015   Echo:   Medications:   . aspirin EC  81 mg Oral Daily  . atorvastatin  20 mg Oral QPM  . cholecalciferol  1,000 Units Oral Daily  . clopidogrel  75 mg Oral Daily  . enoxaparin (LOVENOX) injection  40 mg Subcutaneous Q24H  . lamoTRIgine  100 mg Oral BID  . lisinopril  30 mg Oral QHS  . metoprolol succinate  50 mg Oral Daily  . regadenoson  0.4 mg Intravenous Once       Assessment/Plan:   Chest Pain:  Distant CABG  Troponin  essentially negative Nonspecific ECG changes Patient would like to avoid cath and given overall health and presentation ok to order lexiscan myovue this am.  Have called lab and placed order They indicate can be done today.  Amylase minimally elevated may have passed a stone with CBD dilatation  CVA:  PT/OT Left sided hemiparesis continue ASA/Plavix  HTN:  Well controlled.  Continue current medications and low sodium Dash type diet.    Charlton Haws 12/02/2015, 10:26 AM

## 2015-12-02 NOTE — Progress Notes (Signed)
lexiscan myoview completed no complications but + vomiting.  Zofran was given.

## 2015-12-02 NOTE — Plan of Care (Signed)
Problem: Pain Managment: Goal: General experience of comfort will improve Outcome: Completed/Met Date Met:  12/02/15 Pt educated on pain scale and intervention. Pt verbalized understanding.

## 2015-12-02 NOTE — Progress Notes (Signed)
  Echocardiogram 2D Echocardiogram has been performed.  Leta JunglingCooper, Dolph Tavano M 12/02/2015, 2:35 PM

## 2015-12-02 NOTE — Progress Notes (Signed)
[facility]                                                                              Patient Demographics  [name] [name], is a 75 [name].o. female, DOB - 12/02/1940, WUJ:[id number]  Admit date - 12/01/2015   Admitting Physician [name] Dallasurtis J Woods, MD  Outpatient Primary MD for the patient is [name] RubensteinSUN,VYVYAN Y, MD  LOS -   days    Chief Complaint  Patient presents with  . Chest Pain       Brief HPI   HPI: [age] year old BF PMHx CAD with CABG in the 1990s, stroke in 2010 left-sided residual weakness utilizes a cane for ambulation, hypertension, dyslipidemia and seizure disorder with remote history of peroneal DVT who presented to the ER with chest pain. Of note the patient had a mechanical fall earlierLast week when she landed face forward after tripping over her foot she sustained some abrasions to both cheeks more prominent over the left side of the face. She also had some abrasions on the left side of the chest wall/abdomen. Upon presentation to the ER she reported right armpit pain that radiated to the back after sleeping on her left side. The patient reported that because of her history of CVA she was instructed to avoid sleeping on her left side. Another provider documented the patient was having left-sided chest discomfort with pain radiating into her back and into the left arm as well as her right arm level 6/10.  Patient was admitted for further workup.   Assessment & Plan   Chest pain/ CAD- Has both some typical and atypical features , In the setting of CAD status post CABG in 1997  -Serial troponin showed 1 elevated troponin 0.05 Cardiology consulted - Stress test today - Patient has not followed up with a cardiologist in the past 3 years since returning to the [facility],outpatient follow-up as well  Lipid panel showed LDL 11  -Continue aspirin, Plavix and beta blocker as well as statin   HTN  - on admission, SBP in 200s, improved after administration of  nitroglycerin - continue Toprol-XL 50 mg daily, lisinopril. May add Imdur if still elevated    Hemiparesis and alteration of sensations as late effects of stroke -Last evaluation at neurology office was on 3/17 - Continue aspirin, Plavix, statin   Fall at home -Described as mechanical -No recent issues otherwise and patient states she tripped over her foot and typically always ambulates with assistance with her cane. PT evaluation prior to discharge   Common bile duct dilatation -No intrahepatic dilatation and no evidence of cholelithiasis on CT -No abdominal pain or GI symptoms, amylase minimally elevated may have passed a stone with CBD dilatation. No choledocholithiasis or cholelithiasis on ultrasound.  Compression ssion fracture of L1 lumbar vertebra  -No reports of chronic back pain   Code Status:full code  Family Communication: Discussed in detail with the patient, all imaging results, lab results explained to the patient or *   Disposition Plan:   Time Spent in minutes  25 minutes  Procedures   stress test today Consults    cardiology   DVT Prophylaxis  Lovenox   Medications  Scheduled Meds: . aspirin EC  81 mg Oral Daily  . atorvastatin  20 mg Oral QPM  . cholecalciferol  1,000 Units Oral Daily  . clopidogrel  75 mg Oral Daily  . enoxaparin (LOVENOX) injection  40 mg Subcutaneous Q24H  . lamoTRIgine  100 mg Oral BID  . lisinopril  30 mg Oral QHS  . metoprolol succinate  50 mg Oral Daily  . ondansetron      . regadenoson       Continuous Infusions:  PRN Meds:.acetaminophen, gi cocktail, hydrALAZINE, morphine injection, nitroGLYCERIN, ondansetron (ZOFRAN) IV   Antibiotics   Anti-infectives    None        Subjective:   [name] was seen and examined today.Chest pain improved. Patient denies dizziness, shortness of breath, abdominal pain, N/V/D/C, new weakness, numbess, tingling. No acute events overnight.    Objective:   Filed Vitals:    12/02/15 1231 12/02/15 1233 12/02/15 1235 12/02/15 1239  BP: 163/75 168/72 177/79 179/88  Pulse: 95 90 114 94  Temp:      TempSrc:      Resp:      SpO2:        Intake/Output Summary (Last 24 hours) at 12/02/15 1316 Last data filed at 12/02/15 1239  Gross per 24 hour  Intake      0 ml  Output    100 ml  Net   -100 ml     Wt Readings from Last 3 Encounters:  02/02/14 84.369 kg (186 lb)  03/14/13 81.647 kg (180 lb)     Exam  General: Alert and oriented x 3, NAD  HEENT:    Neck:   CVS: S1 S2 auscultated, no rubs, murmurs or gallops. Regular rate and rhythm.  Respiratory: Clear to auscultation bilaterally, no wheezing, rales or rhonchi  Abdomen: Soft, nontender, nondistended, + bowel sounds  Ext: no cyanosis clubbing or edema  Neuro: left-sided hemiparesis  Skin: No rashes  Psych: Normal affect and demeanor, alert and oriented x3    Data Reviewed:  I have personally reviewed following labs and imaging studies  Micro Results No results found for this or any previous visit (from the past 240 hour(s)).  Radiology Reports Ct Head Wo Contrast  12/01/2015  CLINICAL DATA:  Bilateral orbital hematomas after fall. No loss of consciousness. EXAM: CT HEAD WITHOUT CONTRAST TECHNIQUE: Contiguous axial images were obtained from the base of the skull through the vertex without intravenous contrast. COMPARISON:  CT scan of September 29, 2013. FINDINGS: Bony calvarium appears intact. Encephalomalacia is again noted in the right MCA territory consistent with old infarction. No mass effect or midline shift is noted. Ventricular size is within normal limits. There is no evidence of mass lesion, hemorrhage or acute infarction. IMPRESSION: Old right MCA infarction.  No acute intracranial abnormality seen. Electronically Signed   By: Lupita Raider, M.D.   On: 12/01/2015 14:14   Dg Chest Portable 1 View  12/01/2015  CLINICAL DATA:  RIGHT-sided chest pain and arm pain for unknown period of  time, coronary artery disease, hypertension, prior stroke EXAM: PORTABLE CHEST 1 VIEW COMPARISON:  Portable exam 1216 hours compared to 04/02/2015 FINDINGS: Upper normal heart size post median sternotomy. Mediastinal contours and pulmonary vascularity normal. Lungs clear. No pleural effusion or pneumothorax. Bones unremarkable. IMPRESSION: No acute abnormalities. Electronically Signed   By: Ulyses Southward M.D.   On: 12/01/2015 12:50   Ct Angio Chest Aorta W/cm &/or Wo/cm  12/01/2015  CLINICAL DATA:  Larey Seat Monday, bilateral orbital hematomas, abrasion to left zygoma. Patient describes right axillary pain extending into back. Patient on blood thinners. EXAM: CT ANGIOGRAPHY CHEST, ABDOMEN AND PELVIS TECHNIQUE: Multidetector CT imaging through the chest, abdomen and pelvis was performed using the standard protocol during bolus administration of intravenous contrast. Multiplanar reconstructed images and MIPs were obtained and reviewed to evaluate the vascular anatomy. CONTRAST:  100 cc Isovue 370 COMPARISON:  None. FINDINGS: CTA CHEST FINDINGS Thoracic aorta appears intact and normal in configuration. No aneurysm or dissection. Scattered atherosclerotic changes seen along the walls of the thoracic aorta. No pulmonary embolism identified. Heart size is upper normal. Patient is status post median sternotomy for presumed CABG. Diffuse coronary artery calcifications are noted. No pericardial effusion. No pulmonary infiltrate, mass or effusion. No pneumothorax. Trachea and central bronchi are unremarkable. No masses or enlarged lymph nodes seen within the mediastinum or perihilar regions. No acute or suspicious osseous lesion. No rib fracture or displacement. Mild degenerative spurring noted throughout the scoliotic thoracic spine. Review of the MIP images confirms the above findings. CTA ABDOMEN AND PELVIS FINDINGS Atherosclerotic changes are seen along the walls of the normal-caliber abdominal aorta, at the origins of the  aortic branch vessels and throughout the pelvic vasculature. No arterial occlusion seen. No aneurysm or dissection. Liver, gallbladder, spleen, pancreas and adrenal glands appear normal. Areas of scarring noted within each renal cortex. No renal stone or hydronephrosis. No ureteral or bladder calculi identified. There is common bile duct dilatation measuring 12 mm diameter. No bile duct stone seen. No significant intrahepatic bile duct dilatation. Bowel is normal in caliber. No bowel wall thickening or evidence of bowel wall inflammation seen. Moderate amount of stool and gas throughout the colon. Appendix is not seen but there are no inflammatory changes about the cecum to suggest acute appendicitis. Small free fluid in the lower pelvis. No other free fluid seen. No abscess collection. No free intraperitoneal air. There are 4 lumbar type vertebral bodies. Based on this numbering, there is a compression fracture deformity of the L1 vertebral body which is of uncertain age but favored to be chronic. No acute-appearing osseous abnormality seen. Review of the MIP images confirms the above findings. IMPRESSION: 1. No acute- appearing abnormality of the thoracic or abdominal aorta. No aortic aneurysm or dissection. No evidence of traumatic aortic injury. 2. Overall, no evidence of acute intrathoracic abnormality. Chronic findings detailed above. 3. Common bile duct dilatation measuring up to 12 mm diameter. No obstructing bile duct stone identified. Recommend correlation with liver function tests. If abnormal, would consider MRCP for further characterization. 4. Compression fracture deformity of the L1 vertebral body, approximately 20% compressed anteriorly, of uncertain age but favored to be chronic. 5. Additional chronic/incidental findings detailed above. Electronically Signed   By: Bary Richard M.D.   On: 12/01/2015 14:34   Ct Cta Abd/pel W/cm &/or W/o Cm  12/01/2015  CLINICAL DATA:  Larey Seat Monday, bilateral orbital  hematomas, abrasion to left zygoma. Patient describes right axillary pain extending into back. Patient on blood thinners. EXAM: CT ANGIOGRAPHY CHEST, ABDOMEN AND PELVIS TECHNIQUE: Multidetector CT imaging through the chest, abdomen and pelvis was performed using the standard protocol during bolus administration of intravenous contrast. Multiplanar reconstructed images and MIPs were obtained and reviewed to evaluate the vascular anatomy. CONTRAST:  100 cc Isovue 370 COMPARISON:  None. FINDINGS: CTA CHEST FINDINGS Thoracic aorta appears intact and normal in configuration. No aneurysm or dissection. Scattered atherosclerotic changes seen along the walls  of the thoracic aorta. No pulmonary embolism identified. Heart size is upper normal. Patient is status post median sternotomy for presumed CABG. Diffuse coronary artery calcifications are noted. No pericardial effusion. No pulmonary infiltrate, mass or effusion. No pneumothorax. Trachea and central bronchi are unremarkable. No masses or enlarged lymph nodes seen within the mediastinum or perihilar regions. No acute or suspicious osseous lesion. No rib fracture or displacement. Mild degenerative spurring noted throughout the scoliotic thoracic spine. Review of the MIP images confirms the above findings. CTA ABDOMEN AND PELVIS FINDINGS Atherosclerotic changes are seen along the walls of the normal-caliber abdominal aorta, at the origins of the aortic branch vessels and throughout the pelvic vasculature. No arterial occlusion seen. No aneurysm or dissection. Liver, gallbladder, spleen, pancreas and adrenal glands appear normal. Areas of scarring noted within each renal cortex. No renal stone or hydronephrosis. No ureteral or bladder calculi identified. There is common bile duct dilatation measuring 12 mm diameter. No bile duct stone seen. No significant intrahepatic bile duct dilatation. Bowel is normal in caliber. No bowel wall thickening or evidence of bowel wall  inflammation seen. Moderate amount of stool and gas throughout the colon. Appendix is not seen but there are no inflammatory changes about the cecum to suggest acute appendicitis. Small free fluid in the lower pelvis. No other free fluid seen. No abscess collection. No free intraperitoneal air. There are 4 lumbar type vertebral bodies. Based on this numbering, there is a compression fracture deformity of the L1 vertebral body which is of uncertain age but favored to be chronic. No acute-appearing osseous abnormality seen. Review of the MIP images confirms the above findings. IMPRESSION: 1. No acute- appearing abnormality of the thoracic or abdominal aorta. No aortic aneurysm or dissection. No evidence of traumatic aortic injury. 2. Overall, no evidence of acute intrathoracic abnormality. Chronic findings detailed above. 3. Common bile duct dilatation measuring up to 12 mm diameter. No obstructing bile duct stone identified. Recommend correlation with liver function tests. If abnormal, would consider MRCP for further characterization. 4. Compression fracture deformity of the L1 vertebral body, approximately 20% compressed anteriorly, of uncertain age but favored to be chronic. 5. Additional chronic/incidental findings detailed above. Electronically Signed   By: Bary Richard M.D.   On: 12/01/2015 14:34   US Abdomen Limited Ruq  12/01/2015  CLINICAL DATA:  Abdominal pain for 1 day. EXAM: US ABDOMEN LIMITED - RIGHT UPPER QUADRANT COMPARISON:  CT abdomen from earlier same day. FINDINGS: Gallbladder: No gallstones seen. There is no gallbladder wall thickening, pericholecystic fluid or other secondary sonographic signs of acute cholecystitis. No sonographic Murphy's sign elicited during the examination, per the sonographer. Common bile duct: Diameter: At the level of the porta hepatis, the common bile duct is upper normal at 8 mm diameter. There is more significant dilatation of the distal common bile duct measuring up  to 11 mm diameter, compatible with the 12 mm measurement on earlier CT. No bile duct stone identified. No significant intrahepatic bile duct dilatation identified. Liver: No focal lesion identified. Within normal limits in parenchymal echogenicity. IMPRESSION: 1. Common bile duct dilatation, measuring up to 11 mm diameter, of uncertain etiology. No obstructing bile duct stone or mass identified on this exam or on the earlier CT. On the earlier CT, there appears to be appropriate tapering of the distal-most common bile duct. Consider correlation with liver function tests and, if abnormal, MRCP. 2. Gallbladder appears normal. No gallstones seen. No evidence of cholecystitis. Electronically Signed   By: Weyman Croon  Linde Gillis M.D.   On: 12/01/2015 18:46    CBC  Recent Labs Lab 12/01/15 1227 12/01/15 1235  WBC 4.0  --   HGB 13.8 15.6*  HCT 44.3 46.0  PLT 193  --   MCV 87.0  --   MCH 27.1  --   MCHC 31.2  --   RDW 13.5  --     Chemistries   Recent Labs Lab 12/01/15 1227 12/01/15 1235 12/01/15 1634  NA 141 143  --   K 4.0 4.1  --   CL 106 104  --   CO2 27  --   --   GLUCOSE 102* 94  --   BUN 8 11  --   CREATININE 0.78 0.70  --   CALCIUM 9.7  --   --   AST  --   --  24  ALT  --   --  22  ALKPHOS  --   --  77  BILITOT  --   --  0.5   ------------------------------------------------------------------------------------------------------------------ CrCl cannot be calculated (Unknown ideal weight.). ------------------------------------------------------------------------------------------------------------------ No results for input(s): HGBA1C in the last 72 hours. ------------------------------------------------------------------------------------------------------------------  Recent Labs  12/02/15 0318  CHOL 153  HDL 41  LDLCALC 101*  TRIG 57  CHOLHDL 3.7   ------------------------------------------------------------------------------------------------------------------ No  results for input(s): TSH, T4TOTAL, T3FREE, THYROIDAB in the last 72 hours.  Invalid input(s): FREET3 ------------------------------------------------------------------------------------------------------------------ No results for input(s): VITAMINB12, FOLATE, FERRITIN, TIBC, IRON, RETICCTPCT in the last 72 hours.  Coagulation profile No results for input(s): INR, PROTIME in the last 168 hours.  No results for input(s): DDIMER in the last 72 hours.  Cardiac Enzymes  Recent Labs Lab 12/01/15 1634 12/01/15 2137 12/02/15 0318  TROPONINI <0.03 0.05* <0.03   ------------------------------------------------------------------------------------------------------------------ Invalid input(s): POCBNP  No results for input(s): GLUCAP in the last 72 hours.   RAI,RIPUDEEP M.D. [facility] 12/02/2015, 1:16 PM  Pager: [phone] Between 7am to 7pm - call Pager - [phone]  After 7pm go to [email or web address] - password [email or web address]  Call night coverage person covering after 7pm

## 2015-12-03 DIAGNOSIS — R569 Unspecified convulsions: Secondary | ICD-10-CM | POA: Diagnosis not present

## 2015-12-03 DIAGNOSIS — M21372 Foot drop, left foot: Secondary | ICD-10-CM | POA: Diagnosis not present

## 2015-12-03 DIAGNOSIS — R269 Unspecified abnormalities of gait and mobility: Secondary | ICD-10-CM | POA: Diagnosis not present

## 2015-12-03 DIAGNOSIS — R079 Chest pain, unspecified: Secondary | ICD-10-CM | POA: Diagnosis not present

## 2015-12-03 DIAGNOSIS — I739 Peripheral vascular disease, unspecified: Secondary | ICD-10-CM | POA: Diagnosis not present

## 2015-12-03 DIAGNOSIS — E7800 Pure hypercholesterolemia, unspecified: Secondary | ICD-10-CM | POA: Diagnosis not present

## 2015-12-03 DIAGNOSIS — R300 Dysuria: Secondary | ICD-10-CM | POA: Diagnosis not present

## 2015-12-03 DIAGNOSIS — S0083XA Contusion of other part of head, initial encounter: Secondary | ICD-10-CM | POA: Diagnosis not present

## 2015-12-03 DIAGNOSIS — W19XXXA Unspecified fall, initial encounter: Secondary | ICD-10-CM | POA: Diagnosis not present

## 2015-12-03 DIAGNOSIS — Y92099 Unspecified place in other non-institutional residence as the place of occurrence of the external cause: Secondary | ICD-10-CM | POA: Diagnosis not present

## 2015-12-03 DIAGNOSIS — S32010A Wedge compression fracture of first lumbar vertebra, initial encounter for closed fracture: Secondary | ICD-10-CM | POA: Diagnosis not present

## 2015-12-03 DIAGNOSIS — I69359 Hemiplegia and hemiparesis following cerebral infarction affecting unspecified side: Secondary | ICD-10-CM | POA: Diagnosis not present

## 2015-12-03 DIAGNOSIS — S32010S Wedge compression fracture of first lumbar vertebra, sequela: Secondary | ICD-10-CM | POA: Diagnosis not present

## 2015-12-03 DIAGNOSIS — I69398 Other sequelae of cerebral infarction: Secondary | ICD-10-CM | POA: Diagnosis not present

## 2015-12-03 DIAGNOSIS — G40909 Epilepsy, unspecified, not intractable, without status epilepticus: Secondary | ICD-10-CM | POA: Diagnosis not present

## 2015-12-03 DIAGNOSIS — R51 Headache: Secondary | ICD-10-CM | POA: Diagnosis not present

## 2015-12-03 DIAGNOSIS — I2510 Atherosclerotic heart disease of native coronary artery without angina pectoris: Secondary | ICD-10-CM | POA: Diagnosis not present

## 2015-12-03 DIAGNOSIS — R0789 Other chest pain: Secondary | ICD-10-CM | POA: Diagnosis not present

## 2015-12-03 DIAGNOSIS — I209 Angina pectoris, unspecified: Secondary | ICD-10-CM | POA: Diagnosis not present

## 2015-12-03 DIAGNOSIS — H05232 Hemorrhage of left orbit: Secondary | ICD-10-CM | POA: Diagnosis not present

## 2015-12-03 DIAGNOSIS — K5900 Constipation, unspecified: Secondary | ICD-10-CM | POA: Diagnosis not present

## 2015-12-03 DIAGNOSIS — M810 Age-related osteoporosis without current pathological fracture: Secondary | ICD-10-CM | POA: Diagnosis not present

## 2015-12-03 DIAGNOSIS — I639 Cerebral infarction, unspecified: Secondary | ICD-10-CM | POA: Diagnosis not present

## 2015-12-03 DIAGNOSIS — R32 Unspecified urinary incontinence: Secondary | ICD-10-CM | POA: Diagnosis not present

## 2015-12-03 DIAGNOSIS — K838 Other specified diseases of biliary tract: Secondary | ICD-10-CM | POA: Diagnosis not present

## 2015-12-03 DIAGNOSIS — I10 Essential (primary) hypertension: Secondary | ICD-10-CM | POA: Diagnosis not present

## 2015-12-03 DIAGNOSIS — I252 Old myocardial infarction: Secondary | ICD-10-CM | POA: Diagnosis not present

## 2015-12-03 LAB — CBC
HCT: 37.1 % (ref 36.0–46.0)
Hemoglobin: 12 g/dL (ref 12.0–15.0)
MCH: 28.2 pg (ref 26.0–34.0)
MCHC: 32.3 g/dL (ref 30.0–36.0)
MCV: 87.1 fL (ref 78.0–100.0)
PLATELETS: 192 10*3/uL (ref 150–400)
RBC: 4.26 MIL/uL (ref 3.87–5.11)
RDW: 14 % (ref 11.5–15.5)
WBC: 5.2 10*3/uL (ref 4.0–10.5)

## 2015-12-03 LAB — BASIC METABOLIC PANEL
Anion gap: 11 (ref 5–15)
BUN: 12 mg/dL (ref 6–20)
CALCIUM: 9.2 mg/dL (ref 8.9–10.3)
CHLORIDE: 105 mmol/L (ref 101–111)
CO2: 25 mmol/L (ref 22–32)
CREATININE: 0.82 mg/dL (ref 0.44–1.00)
Glucose, Bld: 95 mg/dL (ref 65–99)
Potassium: 4.3 mmol/L (ref 3.5–5.1)
SODIUM: 141 mmol/L (ref 135–145)

## 2015-12-03 MED ORDER — NITROGLYCERIN 0.4 MG SL SUBL: 0.4000 mg | SUBLINGUAL_TABLET | SUBLINGUAL | Status: DC | PRN

## 2015-12-03 NOTE — Evaluation (Signed)
Physical Therapy Evaluation Patient Details Name: [name] [name] MRN: [id number] DOB: 1941-03-07 Today's Date: 12/03/2015   History of Present Illness  Pt is a 75 y/o F admitted w/ chest pain and tingling Rt UE w /resolution of symptoms after nitro.  Pt's PMH includes CABG, Rt MCA w/ Lt UE hemiparesis and Lt LE hemiplegia, DVT, osteoporosis, heart attack, PVD, cervical spine fracture.    Clinical Impression  Pt admitted with above diagnosis. Pt currently with functional limitations due to the deficits listed below (see PT Problem List). Pt currently requires mod assist for transfers and very short distance ambulation.  Due to pt's history of falls, heavy reliance on assist, and being at home alone the majority of the day, recommendation at d/c is SNF for additional rehab to increase independence and safety with mobility and to decrease burden of care.    Follow Up Recommendations SNF;Supervision for mobility/OOB    Equipment Recommendations  Wheelchair (measurements PT);Wheelchair cushion (measurements PT)    Recommendations for Other Services OT consult     Precautions / Restrictions Precautions Precautions: Fall Restrictions Weight Bearing Restrictions: No      Mobility  Bed Mobility Overal bed mobility: Needs Assistance Bed Mobility: Supine to Sit;Sit to Supine     Supine to sit: Min guard;HOB elevated Sit to supine: Min assist   General bed mobility comments: Use of bed rail w/ HOB elevated to come to sitting.  Min assist managing Lt LE to return to supine.  Transfers Overall transfer level: Needs assistance Equipment used: 1 person hand held assist Transfers: Sit to/from Stand Sit to Stand: Mod assist         General transfer comment: Pt slow to stand and requires 1 person HHA due to Lt sided weakness.  Uncontrolled descent to bed.  Ambulation/Gait Ambulation/Gait assistance: Mod assist Ambulation Distance (Feet): 15 Feet Assistive device: 1 person hand held  assist Gait Pattern/deviations: Decreased stride length;Step-to pattern;Decreased weight shift to left;Antalgic;Trunk flexed;Staggering left;Staggering right;Narrow base of support;Shuffle;Steppage;Decreased dorsiflexion - left   Gait velocity interpretation: <1.8 ft/sec, indicative of risk for recurrent falls General Gait Details: Used Lt AFO and pt demonstrated steppage gait on Lt.  Heavy lean to Rt w/ trunk flexed, relying heavily on 1 person HHA for support.  Pt fatigues and requires 3 standing rest breaks for very short distance ambulation in room.    Stairs            Wheelchair Mobility    Modified Rankin (Stroke Patients Only)       Balance Overall balance assessment: Needs assistance;History of Falls Sitting-balance support: Single extremity supported;Feet supported Sitting balance-Leahy Scale: Fair Sitting balance - Comments: Able to reach down to Rt shoe sitting EOB w/ min guard assist Postural control: Right lateral lean Standing balance support: Single extremity supported;During functional activity Standing balance-Leahy Scale: Poor                               Pertinent Vitals/Pain Pain Assessment: No/denies pain    Home Living Family/patient expects to be discharged to:: Private residence Living Arrangements: Children Available Help at Discharge: Family;Available PRN/intermittently Type of Home: Apartment Home Access: Stairs to enter Entrance Stairs-Rails: Left Entrance Stairs-Number of Steps: 4 Home Layout: One level Home Equipment: Transport chair;Cane - quad;Bedside commode;Shower seat      Prior Function Level of Independence: Needs assistance   Gait / Transfers Assistance Needed: 3 falls in the past few months.  Wears Lt AFO. Only ambulating very short distances to/from bed, BSC, and chair at home.  ADL's / Homemaking Assistance Needed: Daughter assists w/ donning/doffing Lt AFO and tub transfer.  Sits down to shower at mod I.   Daughter assist w/ dressing.        Hand Dominance   Dominant Hand: Right    Extremity/Trunk Assessment   Upper Extremity Assessment: LUE deficits/detail       LUE Deficits / Details: Lt hemiparesis, h/o Rt MCA CVA   Lower Extremity Assessment: LLE deficits/detail;RLE deficits/detail RLE Deficits / Details: strength grossly 4/5 LLE Deficits / Details: 0/5 DF and toe extension, 2/5 knee extension, 1/5 knee flexion; DF PROM limited to ~ (-)10 deg DF  Cervical / Trunk Assessment: Kyphotic  Communication   Communication: No difficulties  Cognition Arousal/Alertness: Awake/alert Behavior During Therapy: WFL for tasks assessed/performed Overall Cognitive Status: Within Functional Limits for tasks assessed                      General Comments General comments (skin integrity, edema, etc.): Pt reports pain along Lt shin when wearing Lt AFO. She has been seen by orthotist about this but it did not help.  Encouraged pt to follow up w/ new orthotist at d/c due to the risks of skin irritation and breakdown.  Pt verbalized understanding.    Exercises        Assessment/Plan    PT Assessment Patient needs continued PT services  PT Diagnosis Difficulty walking;Abnormality of gait;Hemiplegia non-dominant side   PT Problem List Decreased strength;Decreased range of motion;Decreased activity tolerance;Decreased balance;Decreased mobility;Decreased knowledge of use of DME;Decreased safety awareness;Impaired tone;Impaired sensation;Pain  PT Treatment Interventions DME instruction;Gait training;Stair training;Functional mobility training;Therapeutic activities;Balance training;Therapeutic exercise;Neuromuscular re-education;Patient/family education;Wheelchair mobility training   PT Goals (Current goals can be found in the Care Plan section) Acute Rehab PT Goals Patient Stated Goal: to have AFO fixed and to go home PT Goal Formulation: With patient Time For Goal Achievement:  12/17/15 Potential to Achieve Goals: Good    Frequency Min 3X/week   Barriers to discharge Inaccessible home environment;Decreased caregiver support at home during the day and has 4 steps to enter home    Co-evaluation               End of Session Equipment Utilized During Treatment: Gait belt Activity Tolerance: Patient limited by fatigue Patient left: in bed;with call bell/phone within reach;Other (comment);with bed alarm set (on bed pan) Nurse Communication: Mobility status;Other (comment) (pt on bed pan)    Functional Assessment Tool Used: Clinical Judgement Functional Limitation: Mobility: Walking and moving around Mobility: Walking and Moving Around Current Status [phone]([id number]): At least 40 percent but less than 60 percent impaired, limited or restricted Mobility: Walking and Moving Around Goal Status [phone]): At least 40 percent but less than 60 percent impaired, limited or restricted    Time: 0947-1014 PT Time Calculation (min) (ACUTE ONLY): 27 min   Charges:   PT Evaluation $PT Eval Moderate Complexity: 1 Procedure PT Treatments $Therapeutic Activity: 8-22 mins   PT G Codes:   PT G-Codes **NOT FOR INPATIENT CLASS** Functional Assessment Tool Used: Clinical Judgement Functional Limitation: Mobility: Walking and moving around Mobility: Walking and Moving Around Current Status ([id number]): At least 40 percent but less than 60 percent impaired, limited or restricted Mobility: Walking and Moving Around Goal Status [phone]([id number]): At least 40 percent but less than 60 percent impaired, limited or restricted   Encarnacion ChuAshley Kalli Greenfield PT, DPT  Pager:  [phone] Phone: [phone] 12/03/2015, 10:33 AM

## 2015-12-03 NOTE — Plan of Care (Signed)
Problem: Education: Goal: Knowledge of [facility] Education information/materials will improve Outcome: Completed/Met Date Met:  12/03/15 Pt educated on safety, pain scale, and new/current medications. Pt verbalized understanding. Plan of care reviewed with patient and family.

## 2015-12-03 NOTE — Plan of Care (Signed)
Problem: Safety: Goal: Ability to remain free from injury will improve Outcome: Completed/Met Date Met:  12/03/15 Pt educated on safety measures put into place. Pt verbalized understanding.

## 2015-12-03 NOTE — Clinical Social Work Note (Signed)
PASRR: [id number] A.   Roddie McBryant Maxon Kresse MSW, AvaLCSW, [place], [phone]

## 2015-12-03 NOTE — Clinical Social Work Note (Signed)
Clinical Social Work Assessment  Patient Details  Name: [name] [name] MRN: [id number] Date of Birth: [birth year]  Date of referral:  12/03/15               Reason for consult:  Facility Placement, Discharge Planning                Permission sought to share information with:  Facility Sport and exercise psychologist, Family Supports Permission granted to share information::  Yes, Verbal Permission Granted  Name::     [name]::  SNFs  Relationship::  Daughter  Contact Information:  [phone]  Housing/Transportation Living arrangements for the past 2 months:  [place] of Information:  Patient, Adult Children Patient Interpreter Needed:  None Criminal Activity/Legal Involvement Pertinent to Current Situation/Hospitalization:  No - Comment as needed Significant Relationships:  Adult Children Lives with:  Adult Children Do you feel safe going back to the place where you live?  Yes Need for family participation in patient care:  Yes (Comment)  Care giving concerns:  Patient is in need of SNF for supervision and assistance with mobility.   Social Worker assessment / plan:  BSW intern met with patient at bedside to complete assessment. Patient stated that she lives at home with her daughter. When BSW intern started to discuss SNF options, the patient requested that BSW intern speak with patient daughter [name] [name] [phone] to discuss disposition plans. Patient daughter stated that she was apprehensive about sending the patient to SNF. Patient daughter stated that the patient had been to a SNF about 5-6 years ago after having a stroke. Patient daughter stated that the patient was there for 4-5 months and the facility said they could not do anymore to help the patient. Patient daughter did not see the benefit of sending the patient to a SNF. CSW met with patient and patient daughter. The patient decided to go to [place]. Patient will be going to [facility] with an  [place]. CSW will continue to follow and assist as needed.   Employment status:  Retired Nurse, adult PT Recommendations:  [place] / Referral to community resources:  [place]  Patient/Family's Response to care:   Patient and patient family appear to be satisfied with care that patient is currently receiving.   Patient/Family's Understanding of and Emotional Response to Diagnosis, Current Treatment, and Prognosis:  Patient and patient family appear to understand reason for admission, current treatment, and post discharge needs.  Emotional Assessment Appearance:  Appears stated age Attitude/Demeanor/Rapport:   (appropriate) Affect (typically observed):  Appropriate, Calm, Quiet, Pleasant Orientation:  Oriented to Self, Oriented to Place, Oriented to  Time, Oriented to Situation Alcohol / Substance use:  Not Applicable Psych involvement (Current and /or in the community):  No (Comment)  Discharge Needs  Concerns to be addressed:  No discharge needs identified Readmission within the last 30 days:  No Current discharge risk:  None Barriers to Discharge:  Continued Medical Work up   [employer], [phone]

## 2015-12-03 NOTE — [place] FL2 (Signed)
[place] MEDICAID FL2 LEVEL OF CARE SCREENING TOOL     IDENTIFICATION  Patient Name: [name] [name] Birthdate: 1940-11-18 Sex: female Admission Date (Current Location): 12/01/2015  [facility] [place]   Facility and Address:  [facility], [address], [place], [place] [address]      Provider Number: [id number]  Attending Physician Name and Address:  Cathren Harshipudeep K Rai, MD  Relative Name and Phone Number:       Current Level of Care: Hospital Recommended Level of Care: Skilled Nursing Facility Prior Approval Number:    Date Approved/Denied:   PASRR Number:    Discharge Plan: SNF    Current Diagnoses: Patient Active Problem List   Diagnosis Date Noted  . Chest pain 12/01/2015  . Fall at home 12/01/2015  . HTN (hypertension) 12/01/2015  . Common bile duct dilatation 12/01/2015  . Compression fracture of L1 lumbar vertebra (HCC) 12/01/2015  . Chest pain syndrome 12/01/2015  . Abnormality of gait 11/15/2015  . Seizure disorder (HCC) 11/08/2013  . Hemiparesis and alteration of sensations as late effects of stroke (HCC) 03/14/2013  . Headache 03/14/2013    Orientation RESPIRATION BLADDER Height & Weight     Self, Time, Situation, Place  Normal Continent Weight: 160 lb 4.4 oz (72.7 kg) Height:     BEHAVIORAL SYMPTOMS/MOOD NEUROLOGICAL BOWEL NUTRITION STATUS   (appropriate)  (n/a) Continent Diet (Heart Healthy)  AMBULATORY STATUS COMMUNICATION OF NEEDS Skin   Limited Assist Verbally Skin abrasions (L face)                       Personal Care Assistance Level of Assistance  Bathing, Feeding, Dressing Bathing Assistance: Limited assistance Feeding assistance: Independent Dressing Assistance: Limited assistance     Functional Limitations Info  Sight, Hearing, Speech Sight Info: Adequate Hearing Info: Adequate Speech Info: Adequate    SPECIAL CARE FACTORS FREQUENCY  PT (By licensed PT), OT (By licensed OT)     PT  Frequency: 5x/week OT Frequency: 5x/week            Contractures Contractures Info: Not present    Additional Factors Info  Code Status, Allergies Code Status Info: FULL CODE Allergies Info: Latex, Penicillins, Tape           Current Medications (12/03/2015):  This is the current hospital active medication list Current Facility-Administered Medications  Medication Dose Route Frequency Provider Last Rate Last Dose  . acetaminophen (TYLENOL) tablet 650 mg  650 mg Oral Q4H PRN Russella DarAllison L Ellis, NP      . aspirin EC tablet 81 mg  81 mg Oral Daily Russella DarAllison L Ellis, NP   81 mg at 12/03/15 1043  . atorvastatin (LIPITOR) tablet 20 mg  20 mg Oral QPM Russella DarAllison L Ellis, NP   20 mg at 12/02/15 1744  . cholecalciferol (VITAMIN D) tablet 1,000 Units  1,000 Units Oral Daily Russella DarAllison L Ellis, NP   1,000 Units at 12/03/15 1043  . clopidogrel (PLAVIX) tablet 75 mg  75 mg Oral Daily Russella DarAllison L Ellis, NP   75 mg at 12/03/15 1043  . enoxaparin (LOVENOX) injection 40 mg  40 mg Subcutaneous Q24H Drema Dallasurtis J Woods, MD   40 mg at 12/02/15 1954  . gi cocktail (Maalox,Lidocaine,Donnatal)  30 mL Oral QID PRN Russella DarAllison L Ellis, NP      . hydrALAZINE (APRESOLINE) injection 10 mg  10 mg Intravenous Q2H PRN Drema Dallasurtis J Woods, MD   10 mg  at 12/01/15 1938  . lamoTRIgine (LAMICTAL) tablet 100 mg  100 mg Oral BID Russella Dar, NP   100 mg at 12/03/15 1043  . lisinopril (PRINIVIL,ZESTRIL) tablet 30 mg  30 mg Oral QHS Drema Dallas, MD   30 mg at 12/02/15 2149  . metoprolol succinate (TOPROL-XL) 24 hr tablet 50 mg  50 mg Oral Daily Russella Dar, NP   50 mg at 12/03/15 1043  . morphine 2 MG/ML injection 2 mg  2 mg Intravenous Q2H PRN Russella Dar, NP      . nitroGLYCERIN (NITROSTAT) SL tablet 0.4 mg  0.4 mg Sublingual Q5 min PRN Richardean Canal, MD   0.4 mg at 12/01/15 1232  . ondansetron (ZOFRAN) injection 4 mg  4 mg Intravenous Q6H PRN Russella Dar, NP   4 mg at 12/02/15 1239     Discharge Medications: Please see  discharge summary for a list of discharge medications.  Relevant Imaging Results:  Relevant Lab Results:   Additional Information SS# [id number]   Jenita Seashore BSW Intern, [phone]

## 2015-12-03 NOTE — Clinical Social Work Placement (Signed)
   CLINICAL SOCIAL WORK PLACEMENT  NOTE  Date:  12/03/2015  Patient Details  Name: [name] [name] MRN: [id number] Date of Birth: 1941/08/06  Clinical Social Work is seeking post-discharge placement for this patient at the Skilled  Nursing Facility level of care (*CSW will initial, date and re-position this form in  chart as items are completed):  Yes   Patient/family provided with [facility] [place] Clinical Social Work Department's list of facilities offering this level of care within the geographic area requested by the patient (or if unable, by the patient's family).  Yes   Patient/family informed of their freedom to choose among providers that offer the needed level of care, that participate in Medicare, Medicaid or managed care program needed by the patient, have an available bed and are willing to accept the patient.  Yes   Patient/family informed of [facility]'s ownership interest in [facility] and [facility], as well as of the fact that they are under no obligation to receive care at these facilities.  PASRR submitted to EDS on 12/03/15     PASRR number received on       Existing PASRR number confirmed on       FL2 transmitted to all facilities in geographic area requested by pt/family on 12/03/15     FL2 transmitted to all facilities within larger geographic area on       Patient informed that his/her managed care company has contracts with or will negotiate with certain facilities, including the following:        Yes   Patient/family informed of bed offers received.  [facility]     Physician recommends and patient chooses bed at      [facility].  Patient to be transferred to facility by [facility]     Patient family notified on 12/03/15 of transfer.  Name of family member notified:  Cordelia PenSherry     PHYSICIAN Please prepare prescriptions, Please sign FL2     Additional Comment:   Per MD, patient ready to  [facility]. RN, patient/family, and facility notified of patient's discharge. RN given number for report. DC packet on chart. Ambulance transport requested. Family has completed necessary paperwork. BSW intern signing off.    _______________________________________________ Jenita SeashoreMaggie Tacara Hadlock BSW Intern, [phone]

## 2015-12-03 NOTE — Plan of Care (Signed)
Problem: Tissue Perfusion: Goal: Risk factors for ineffective tissue perfusion will decrease Outcome: Completed/Met Date Met:  12/03/15 Pt educated on VTE prophylaxis. Pt verbalized understanding.

## 2015-12-03 NOTE — Plan of Care (Signed)
Problem: Health Behavior/Discharge Planning: Goal: Ability to manage health-related needs will improve Outcome: Completed/Met Date Met:  12/03/15 Pt encouraged to comply with medications and treatment plan. Pt verbalized understanding.

## 2015-12-03 NOTE — Progress Notes (Signed)
Patient ID: [name] [name], female   DOB: 1940-12-17, 75 y.o.   MRN: [id number]    Subjective:  No chest pain   Objective:  Filed Vitals:   12/02/15 1945 12/02/15 2145 12/03/15 0010 12/03/15 0600  BP: 156/77 143/58 130/52 123/65  Pulse:  60 60 60  Temp: 97.9 F (36.6 C)  98.9 F (37.2 C) 98.8 F (37.1 C)  TempSrc: Oral  Oral Oral  Resp: 20 20 18 20   Weight:    72.7 kg (160 lb 4.4 oz)  SpO2: 98% 97% 96% 98%    Intake/Output from previous day:  Intake/Output Summary (Last 24 hours) at 12/03/15 0749 Last data filed at 12/02/15 2258  Gross per 24 hour  Intake      0 ml  Output    750 ml  Net   -750 ml    Physical Exam: Affect appropriate Chronically ill black female  HEENT: normal Neck supple with no adenopathy JVP normal no bruits no thyromegaly Lungs clear with no wheezing and good diaphragmatic motion Heart:  S1/S2 no murmur, no rub, gallop or click PMI normal Abdomen: benighn, BS positve, no tenderness, no AAA no bruit.  No HSM or HJR Distal pulses intact with no bruits No edema LUE paralyzed and contracted LLE weak Walks with brace    Lab Results: Basic Metabolic Panel:  Recent Labs  78/46/9602/10/17 1227 12/01/15 1235 12/03/15 0357  NA 141 143 141  K 4.0 4.1 4.3  CL 106 104 105  CO2 27  --  25  GLUCOSE 102* 94 95  BUN 8 11 12   CREATININE 0.78 0.70 0.82  CALCIUM 9.7  --  9.2   Liver Function Tests:  Recent Labs  12/01/15 1634  AST 24  ALT 22  ALKPHOS 77  BILITOT 0.5  PROT 7.0  ALBUMIN 3.4*    Recent Labs  12/01/15 2137  LIPASE 39  AMYLASE 102*   CBC:  Recent Labs  12/01/15 1227 12/01/15 1235 12/03/15 0357  WBC 4.0  --  5.2  HGB 13.8 15.6* 12.0  HCT 44.3 46.0 37.1  MCV 87.0  --  87.1  PLT 193  --  192   Cardiac Enzymes:  Recent Labs  12/01/15 1634 12/01/15 2137 12/02/15 0318  TROPONINI <0.03 0.05* <0.03   Fasting Lipid Panel:  Recent Labs  12/02/15 0318  CHOL 153  HDL 41  LDLCALC 101*  TRIG 57  CHOLHDL 3.7     Imaging: Ct Head Wo Contrast  12/01/2015  CLINICAL DATA:  Bilateral orbital hematomas after fall. No loss of consciousness. EXAM: CT HEAD WITHOUT CONTRAST TECHNIQUE: Contiguous axial images were obtained from the base of the skull through the vertex without intravenous contrast. COMPARISON:  CT scan of September 29, 2013. FINDINGS: Bony calvarium appears intact. Encephalomalacia is again noted in the right MCA territory consistent with old infarction. No mass effect or midline shift is noted. Ventricular size is within normal limits. There is no evidence of mass lesion, hemorrhage or acute infarction. IMPRESSION: Old right MCA infarction.  No acute intracranial abnormality seen. Electronically Signed   By: Lupita RaiderJames  Green Jr, M.D.   On: 12/01/2015 14:14   Nm Myocar Multi W/spect W/wall Motion / Ef  12/02/2015  CLINICAL DATA:  CAD, prior MI, status post 3 vessel CABG EXAM: MYOCARDIAL IMAGING WITH SPECT (REST AND PHARMACOLOGIC-STRESS) GATED LEFT VENTRICULAR WALL MOTION STUDY LEFT VENTRICULAR EJECTION FRACTION TECHNIQUE: Standard myocardial SPECT imaging was performed after resting intravenous injection of 10 mCi [id number] sestamibi. Subsequently,  intravenous infusion of Lexiscan was performed under the supervision of the Cardiology staff. At peak effect of the drug, 30 mCi Tc-93m sestamibi was injected intravenously and standard myocardial SPECT imaging was performed. Quantitative gated imaging was also performed to evaluate left ventricular wall motion, and estimate left ventricular ejection fraction. COMPARISON:  None. FINDINGS: Perfusion: Moderate sized fixed defect, severe in the apex and moderate in the inferolateral wall, without reversibility. No findings to suggest reversible ischemia. Wall Motion: Mild global hypokinesia. Severe left ventricular dilatation. Left Ventricular Ejection Fraction: 33 % End diastolic volume 142 ml End systolic volume 95 ml IMPRESSION: 1. Moderate fixed defect involving the apex  and inferolateral wall, compatible with the clinical history of prior infarct. No findings to suggest reversible ischemia. 2. Mild global hypokinesia with severe left ventricular dilatation. 3. Left ventricular ejection fraction 33%. 4. High-risk stress test findings*, on the basis of low ejection fraction. *2012 Appropriate Use Criteria for Coronary Revascularization Focused Update: J Am Coll Cardiol. 2012;59(9):857-881. http://content.dementiazones.com.aspx?articleid=[email or web address] Electronically Signed   By: Charline Bills M.D.   On: 12/02/2015 16:55   Dg Chest Portable 1 View  12/01/2015  CLINICAL DATA:  RIGHT-sided chest pain and arm pain for unknown period of time, coronary artery disease, hypertension, prior stroke EXAM: PORTABLE CHEST 1 VIEW COMPARISON:  Portable exam 1216 hours compared to 04/02/2015 FINDINGS: Upper normal heart size post median sternotomy. Mediastinal contours and pulmonary vascularity normal. Lungs clear. No pleural effusion or pneumothorax. Bones unremarkable. IMPRESSION: No acute abnormalities. Electronically Signed   By: Ulyses Southward M.D.   On: 12/01/2015 12:50   Ct Angio Chest Aorta W/cm &/or Wo/cm  12/01/2015  CLINICAL DATA:  Larey Seat Monday, bilateral orbital hematomas, abrasion to left zygoma. Patient describes right axillary pain extending into back. Patient on blood thinners. EXAM: CT ANGIOGRAPHY CHEST, ABDOMEN AND PELVIS TECHNIQUE: Multidetector CT imaging through the chest, abdomen and pelvis was performed using the standard protocol during bolus administration of intravenous contrast. Multiplanar reconstructed images and MIPs were obtained and reviewed to evaluate the vascular anatomy. CONTRAST:  100 cc Isovue 370 COMPARISON:  None. FINDINGS: CTA CHEST FINDINGS Thoracic aorta appears intact and normal in configuration. No aneurysm or dissection. Scattered atherosclerotic changes seen along the walls of the thoracic aorta. No pulmonary embolism identified. Heart size is upper  normal. Patient is status post median sternotomy for presumed CABG. Diffuse coronary artery calcifications are noted. No pericardial effusion. No pulmonary infiltrate, mass or effusion. No pneumothorax. Trachea and central bronchi are unremarkable. No masses or enlarged lymph nodes seen within the mediastinum or perihilar regions. No acute or suspicious osseous lesion. No rib fracture or displacement. Mild degenerative spurring noted throughout the scoliotic thoracic spine. Review of the MIP images confirms the above findings. CTA ABDOMEN AND PELVIS FINDINGS Atherosclerotic changes are seen along the walls of the normal-caliber abdominal aorta, at the origins of the aortic branch vessels and throughout the pelvic vasculature. No arterial occlusion seen. No aneurysm or dissection. Liver, gallbladder, spleen, pancreas and adrenal glands appear normal. Areas of scarring noted within each renal cortex. No renal stone or hydronephrosis. No ureteral or bladder calculi identified. There is common bile duct dilatation measuring 12 mm diameter. No bile duct stone seen. No significant intrahepatic bile duct dilatation. Bowel is normal in caliber. No bowel wall thickening or evidence of bowel wall inflammation seen. Moderate amount of stool and gas throughout the colon. Appendix is not seen but there are no inflammatory changes about the cecum to suggest acute appendicitis. Small free  fluid in the lower pelvis. No other free fluid seen. No abscess collection. No free intraperitoneal air. There are 4 lumbar type vertebral bodies. Based on this numbering, there is a compression fracture deformity of the L1 vertebral body which is of uncertain age but favored to be chronic. No acute-appearing osseous abnormality seen. Review of the MIP images confirms the above findings. IMPRESSION: 1. No acute- appearing abnormality of the thoracic or abdominal aorta. No aortic aneurysm or dissection. No evidence of traumatic aortic injury. 2.  Overall, no evidence of acute intrathoracic abnormality. Chronic findings detailed above. 3. Common bile duct dilatation measuring up to 12 mm diameter. No obstructing bile duct stone identified. Recommend correlation with liver function tests. If abnormal, would consider MRCP for further characterization. 4. Compression fracture deformity of the L1 vertebral body, approximately 20% compressed anteriorly, of uncertain age but favored to be chronic. 5. Additional chronic/incidental findings detailed above. Electronically Signed   By: Bary Richard M.D.   On: 12/01/2015 14:34   Ct Cta Abd/pel W/cm &/or W/o Cm  12/01/2015  CLINICAL DATA:  Larey Seat Monday, bilateral orbital hematomas, abrasion to left zygoma. Patient describes right axillary pain extending into back. Patient on blood thinners. EXAM: CT ANGIOGRAPHY CHEST, ABDOMEN AND PELVIS TECHNIQUE: Multidetector CT imaging through the chest, abdomen and pelvis was performed using the standard protocol during bolus administration of intravenous contrast. Multiplanar reconstructed images and MIPs were obtained and reviewed to evaluate the vascular anatomy. CONTRAST:  100 cc Isovue 370 COMPARISON:  None. FINDINGS: CTA CHEST FINDINGS Thoracic aorta appears intact and normal in configuration. No aneurysm or dissection. Scattered atherosclerotic changes seen along the walls of the thoracic aorta. No pulmonary embolism identified. Heart size is upper normal. Patient is status post median sternotomy for presumed CABG. Diffuse coronary artery calcifications are noted. No pericardial effusion. No pulmonary infiltrate, mass or effusion. No pneumothorax. Trachea and central bronchi are unremarkable. No masses or enlarged lymph nodes seen within the mediastinum or perihilar regions. No acute or suspicious osseous lesion. No rib fracture or displacement. Mild degenerative spurring noted throughout the scoliotic thoracic spine. Review of the MIP images confirms the above findings. CTA  ABDOMEN AND PELVIS FINDINGS Atherosclerotic changes are seen along the walls of the normal-caliber abdominal aorta, at the origins of the aortic branch vessels and throughout the pelvic vasculature. No arterial occlusion seen. No aneurysm or dissection. Liver, gallbladder, spleen, pancreas and adrenal glands appear normal. Areas of scarring noted within each renal cortex. No renal stone or hydronephrosis. No ureteral or bladder calculi identified. There is common bile duct dilatation measuring 12 mm diameter. No bile duct stone seen. No significant intrahepatic bile duct dilatation. Bowel is normal in caliber. No bowel wall thickening or evidence of bowel wall inflammation seen. Moderate amount of stool and gas throughout the colon. Appendix is not seen but there are no inflammatory changes about the cecum to suggest acute appendicitis. Small free fluid in the lower pelvis. No other free fluid seen. No abscess collection. No free intraperitoneal air. There are 4 lumbar type vertebral bodies. Based on this numbering, there is a compression fracture deformity of the L1 vertebral body which is of uncertain age but favored to be chronic. No acute-appearing osseous abnormality seen. Review of the MIP images confirms the above findings. IMPRESSION: 1. No acute- appearing abnormality of the thoracic or abdominal aorta. No aortic aneurysm or dissection. No evidence of traumatic aortic injury. 2. Overall, no evidence of acute intrathoracic abnormality. Chronic findings detailed above. 3.  Common bile duct dilatation measuring up to 12 mm diameter. No obstructing bile duct stone identified. Recommend correlation with liver function tests. If abnormal, would consider MRCP for further characterization. 4. Compression fracture deformity of the L1 vertebral body, approximately 20% compressed anteriorly, of uncertain age but favored to be chronic. 5. Additional chronic/incidental findings detailed above. Electronically Signed   By:  Bary Richard M.D.   On: 12/01/2015 14:34   US Abdomen Limited Ruq  12/01/2015  CLINICAL DATA:  Abdominal pain for 1 day. EXAM: US ABDOMEN LIMITED - RIGHT UPPER QUADRANT COMPARISON:  CT abdomen from earlier same day. FINDINGS: Gallbladder: No gallstones seen. There is no gallbladder wall thickening, pericholecystic fluid or other secondary sonographic signs of acute cholecystitis. No sonographic Murphy's sign elicited during the examination, per the sonographer. Common bile duct: Diameter: At the level of the porta hepatis, the common bile duct is upper normal at 8 mm diameter. There is more significant dilatation of the distal common bile duct measuring up to 11 mm diameter, compatible with the 12 mm measurement on earlier CT. No bile duct stone identified. No significant intrahepatic bile duct dilatation identified. Liver: No focal lesion identified. Within normal limits in parenchymal echogenicity. IMPRESSION: 1. Common bile duct dilatation, measuring up to 11 mm diameter, of uncertain etiology. No obstructing bile duct stone or mass identified on this exam or on the earlier CT. On the earlier CT, there appears to be appropriate tapering of the distal-most common bile duct. Consider correlation with liver function tests and, if abnormal, MRCP. 2. Gallbladder appears normal. No gallstones seen. No evidence of cholecystitis. Electronically Signed   By: Bary Richard M.D.   On: 12/01/2015 18:46    Cardiac Studies:  ECG:  NSR LVH nonspecific lateral ST changes    Telemetry:  NSR 12/03/2015   Echo:   Medications:   . aspirin EC  81 mg Oral Daily  . atorvastatin  20 mg Oral QPM  . cholecalciferol  1,000 Units Oral Daily  . clopidogrel  75 mg Oral Daily  . enoxaparin (LOVENOX) injection  40 mg Subcutaneous Q24H  . lamoTRIgine  100 mg Oral BID  . lisinopril  30 mg Oral QHS  . metoprolol succinate  50 mg Oral Daily       Assessment/Plan:   Chest Pain:  Distant CABG  Troponin essentially negative  Nonspecific ECG changes  Reviewed myovue and fixed inferior wall infarct no ischemia  Reviewed Echo Ef read as normal but to me appears 45-50% with posterior basal and septal hypokinesis Both studies consistant with old MI  No indication for cat Amylase minimally elevated may have passed a stone with CBD dilatation  Continue medical Rx   CVA:  PT/OT Left sided hemiparesis continue ASA/Plavix  HTN:  Well controlled.  Continue current medications and low sodium Dash type diet.    Ok to d/c home will arrange outpatient f/u with  Dr Oneida Alar [facility] 12/03/2015, 7:49 AM

## 2015-12-03 NOTE — Discharge Summary (Signed)
Physician Discharge Summary   Patient ID: [name] [name] MRN: [id number] DOB/AGE: [birth year] 75 y.o.  Admit date: 12/01/2015 Discharge date: 12/03/2015  Primary Care Physician:  Leanor Rubenstein, MD  Discharge Diagnoses:    . Chest pain . HTN (hypertension) . Common bile duct dilatation . Compression fracture of L1 lumbar vertebra (HCC) . Chest pain syndrome   Mechanical fall    Prior CVA  Consults:   Recommendations for Outpatient Follow-up:  1. Please repeat CBC/BMET at next visit   DIET: Heart healthy diet    Allergies:   Allergies  Allergen Reactions  . Latex Rash  . Penicillins Rash    Has patient had a PCN reaction causing immediate rash, facial/tongue/throat swelling, SOB or lightheadedness with hypotension: No Has patient had a PCN reaction causing severe rash involving mucus membranes or skin necrosis: No Has patient had a PCN reaction that required hospitalization No Has patient had a PCN reaction occurring within the last 10 years: No If all of the above answers are "NO", then may proceed with Cephalosporin use.  . Tape Rash     DISCHARGE MEDICATIONS: Current Discharge Medication List    START taking these medications   Details  nitroGLYCERIN (NITROSTAT) 0.4 MG SL tablet Place 1 tablet (0.4 mg total) under the tongue every 5 (five) minutes as needed for chest pain (CP or SOB). Qty: 30 tablet, Refills: 12      CONTINUE these medications which have NOT CHANGED   Details  acetaminophen (TYLENOL) 500 MG tablet Take 500-1,000 mg by mouth every 8 (eight) hours as needed for mild pain.    aspirin EC 81 MG tablet Take 1 tablet (81 mg total) by mouth daily.    atorvastatin (LIPITOR) 20 MG tablet Take 20 mg by mouth every evening.     cholecalciferol (VITAMIN D) 1000 UNITS tablet Take 1,000 Units by mouth daily.    clopidogrel (PLAVIX) 75 MG tablet Take 75 mg by mouth daily. Refills: 0    lamoTRIgine (LAMICTAL) 100 MG tablet TAKE 1 TABLET(100 MG) BY MOUTH  TWICE DAILY Qty: 180 tablet, Refills: 0    lisinopril (PRINIVIL,ZESTRIL) 20 MG tablet Take 20 mg by mouth at bedtime.     metoprolol succinate (TOPROL-XL) 50 MG 24 hr tablet Take 50 mg by mouth daily. Take with or immediately following a meal.         Brief H and P: For complete details please refer to admission H and P, but in brief 75 year old BF PMHx CAD with CABG in the 1990s, stroke in 2010 left-sided residual weakness utilizes a cane for ambulation, hypertension, dyslipidemia and seizure disorder with remote history of peroneal DVT who presented to the ER with chest pain. Of note the patient had a mechanical fall earlierLast week when she landed face forward after tripping over her foot she sustained some abrasions to both cheeks more prominent over the left side of the face. She also had some abrasions on the left side of the chest wall/abdomen. Upon presentation to the ER she reported right armpit pain that radiated to the back after sleeping on her left side. The patient reported that because of her history of CVA she was instructed to avoid sleeping on her left side. Another provider documented the patient was having left-sided chest discomfort with pain radiating into her back and into the left arm as well as her right arm level 6/10.  Patient was admitted for further workup.  Hospital Course:   Chest pain/ CAD- Had both  some typical and atypical features , In the setting of CAD status post CABG in 1997  -Serial troponin showed 1 elevated troponin 0.05, hence Cardiology was consulted - Patient underwent stress test which showed moderate fixed defect involving the apex, inferolateral wall, prior infarct, no findings to suggest reversible ischemia, mild global hypokinesia with severe left ventricular dilatation, EF 33%.  - 2D- ECHO EF showed EF of 60-65% with grade 1 diastolic dysfunction - Patient has not followed up with a cardiologist in the past 3 years since returning to the  [facility], patient will need outpatient follow-up with cardiology. Message sent to Trish.   -Continue aspirin, Plavix and beta blocker as well as statin   HTN  - on admission, SBP in 200s, improved after administration of nitroglycerin -Continue beta blocker 50mg  and lisinopril, 20 mg daily   Hemiparesis and alteration of sensations as late effects of stroke -Last evaluation at neurology office was on 3/17, follow outpatient - Continue aspirin, Plavix, statin   Fall at home -Described as mechanical -No recent issues otherwise and patient states she tripped over her foot and typically always ambulates with assistance with her cane. PT evaluation prior to discharge recommended skilled nursing facility   Common bile duct dilatation -No intrahepatic dilatation and no evidence of cholelithiasis on CT -No abdominal pain or GI symptoms, amylase minimally elevated may have passed a stone with CBD dilatation. No choledocholithiasis or cholelithiasis on ultrasound.  Compression ssion fracture of L1 lumbar vertebra  -No reports of chronic back pain    Day of Discharge BP 122/69 mmHg  Pulse 63  Temp(Src) 98.8 F (37.1 C) (Oral)  Resp 18  Wt 72.7 kg (160 lb 4.4 oz)  SpO2 99%  Physical Exam: General: Alert and awake oriented x3 not in any acute distress. HEENT: anicteric sclera, pupils reactive to light and accommodation CVS: S1-S2 clear no murmur rubs or gallops Chest: clear to auscultation bilaterally, no wheezing rales or rhonchi Abdomen: soft nontender, nondistended, normal bowel sounds Extremities: no cyanosis, clubbing or edema noted bilaterally    The results of significant diagnostics from this hospitalization (including imaging, microbiology, ancillary and laboratory) are listed below for reference.    LAB RESULTS: Basic Metabolic Panel:  Recent Labs Lab 12/01/15 1227 12/01/15 1235 12/03/15 0357  NA 141 143 141  K 4.0 4.1 4.3  CL 106 104 105  CO2 27  --  25   GLUCOSE 102* 94 95  BUN 8 11 12   CREATININE 0.78 0.70 0.82  CALCIUM 9.7  --  9.2   Liver Function Tests:  Recent Labs Lab 12/01/15 1634  AST 24  ALT 22  ALKPHOS 77  BILITOT 0.5  PROT 7.0  ALBUMIN 3.4*    Recent Labs Lab 12/01/15 2137  LIPASE 39  AMYLASE 102*   No results for input(s): AMMONIA in the last 168 hours. CBC:  Recent Labs Lab 12/01/15 1227 12/01/15 1235 12/03/15 0357  WBC 4.0  --  5.2  HGB 13.8 15.6* 12.0  HCT 44.3 46.0 37.1  MCV 87.0  --  87.1  PLT 193  --  192   Cardiac Enzymes:  Recent Labs Lab 12/01/15 2137 12/02/15 0318  TROPONINI 0.05* <0.03   BNP: Invalid input(s): POCBNP CBG: No results for input(s): GLUCAP in the last 168 hours.  Significant Diagnostic Studies:  Ct Head Wo Contrast  12/01/2015  CLINICAL DATA:  Bilateral orbital hematomas after fall. No loss of consciousness. EXAM: CT HEAD WITHOUT CONTRAST TECHNIQUE: Contiguous axial images were obtained  from the base of the skull through the vertex without intravenous contrast. COMPARISON:  CT scan of September 29, 2013. FINDINGS: Bony calvarium appears intact. Encephalomalacia is again noted in the right MCA territory consistent with old infarction. No mass effect or midline shift is noted. Ventricular size is within normal limits. There is no evidence of mass lesion, hemorrhage or acute infarction. IMPRESSION: Old right MCA infarction.  No acute intracranial abnormality seen. Electronically Signed   By: Lupita Raider, M.D.   On: 12/01/2015 14:14   Nm Myocar Multi W/spect W/wall Motion / Ef  12/02/2015  CLINICAL DATA:  CAD, prior MI, status post 3 vessel CABG EXAM: MYOCARDIAL IMAGING WITH SPECT (REST AND PHARMACOLOGIC-STRESS) GATED LEFT VENTRICULAR WALL MOTION STUDY LEFT VENTRICULAR EJECTION FRACTION TECHNIQUE: Standard myocardial SPECT imaging was performed after resting intravenous injection of 10 mCi Tc-65m sestamibi. Subsequently, intravenous infusion of Lexiscan was performed under the  supervision of the Cardiology staff. At peak effect of the drug, 30 mCi Tc-34m sestamibi was injected intravenously and standard myocardial SPECT imaging was performed. Quantitative gated imaging was also performed to evaluate left ventricular wall motion, and estimate left ventricular ejection fraction. COMPARISON:  None. FINDINGS: Perfusion: Moderate sized fixed defect, severe in the apex and moderate in the inferolateral wall, without reversibility. No findings to suggest reversible ischemia. Wall Motion: Mild global hypokinesia. Severe left ventricular dilatation. Left Ventricular Ejection Fraction: 33 % End diastolic volume 142 ml End systolic volume 95 ml IMPRESSION: 1. Moderate fixed defect involving the apex and inferolateral wall, compatible with the clinical history of prior infarct. No findings to suggest reversible ischemia. 2. Mild global hypokinesia with severe left ventricular dilatation. 3. Left ventricular ejection fraction 33%. 4. High-risk stress test findings*, on the basis of low ejection fraction. *2012 Appropriate Use Criteria for Coronary Revascularization Focused Update: J Am Coll Cardiol. 2012;59(9):857-881. http://content.dementiazones.com.aspx?articleid=[email or web address] Electronically Signed   By: Charline Bills M.D.   On: 12/02/2015 16:55   Dg Chest Portable 1 View  12/01/2015  CLINICAL DATA:  RIGHT-sided chest pain and arm pain for unknown period of time, coronary artery disease, hypertension, prior stroke EXAM: PORTABLE CHEST 1 VIEW COMPARISON:  Portable exam 1216 hours compared to 04/02/2015 FINDINGS: Upper normal heart size post median sternotomy. Mediastinal contours and pulmonary vascularity normal. Lungs clear. No pleural effusion or pneumothorax. Bones unremarkable. IMPRESSION: No acute abnormalities. Electronically Signed   By: Ulyses Southward M.D.   On: 12/01/2015 12:50   Ct Angio Chest Aorta W/cm &/or Wo/cm  12/01/2015  CLINICAL DATA:  Larey Seat Monday, bilateral orbital hematomas,  abrasion to left zygoma. Patient describes right axillary pain extending into back. Patient on blood thinners. EXAM: CT ANGIOGRAPHY CHEST, ABDOMEN AND PELVIS TECHNIQUE: Multidetector CT imaging through the chest, abdomen and pelvis was performed using the standard protocol during bolus administration of intravenous contrast. Multiplanar reconstructed images and MIPs were obtained and reviewed to evaluate the vascular anatomy. CONTRAST:  100 cc Isovue 370 COMPARISON:  None. FINDINGS: CTA CHEST FINDINGS Thoracic aorta appears intact and normal in configuration. No aneurysm or dissection. Scattered atherosclerotic changes seen along the walls of the thoracic aorta. No pulmonary embolism identified. Heart size is upper normal. Patient is status post median sternotomy for presumed CABG. Diffuse coronary artery calcifications are noted. No pericardial effusion. No pulmonary infiltrate, mass or effusion. No pneumothorax. Trachea and central bronchi are unremarkable. No masses or enlarged lymph nodes seen within the mediastinum or perihilar regions. No acute or suspicious osseous lesion. No rib fracture or displacement. Mild  degenerative spurring noted throughout the scoliotic thoracic spine. Review of the MIP images confirms the above findings. CTA ABDOMEN AND PELVIS FINDINGS Atherosclerotic changes are seen along the walls of the normal-caliber abdominal aorta, at the origins of the aortic branch vessels and throughout the pelvic vasculature. No arterial occlusion seen. No aneurysm or dissection. Liver, gallbladder, spleen, pancreas and adrenal glands appear normal. Areas of scarring noted within each renal cortex. No renal stone or hydronephrosis. No ureteral or bladder calculi identified. There is common bile duct dilatation measuring 12 mm diameter. No bile duct stone seen. No significant intrahepatic bile duct dilatation. Bowel is normal in caliber. No bowel wall thickening or evidence of bowel wall inflammation seen.  Moderate amount of stool and gas throughout the colon. Appendix is not seen but there are no inflammatory changes about the cecum to suggest acute appendicitis. Small free fluid in the lower pelvis. No other free fluid seen. No abscess collection. No free intraperitoneal air. There are 4 lumbar type vertebral bodies. Based on this numbering, there is a compression fracture deformity of the L1 vertebral body which is of uncertain age but favored to be chronic. No acute-appearing osseous abnormality seen. Review of the MIP images confirms the above findings. IMPRESSION: 1. No acute- appearing abnormality of the thoracic or abdominal aorta. No aortic aneurysm or dissection. No evidence of traumatic aortic injury. 2. Overall, no evidence of acute intrathoracic abnormality. Chronic findings detailed above. 3. Common bile duct dilatation measuring up to 12 mm diameter. No obstructing bile duct stone identified. Recommend correlation with liver function tests. If abnormal, would consider MRCP for further characterization. 4. Compression fracture deformity of the L1 vertebral body, approximately 20% compressed anteriorly, of uncertain age but favored to be chronic. 5. Additional chronic/incidental findings detailed above. Electronically Signed   By: Bary Richard M.D.   On: 12/01/2015 14:34   Ct Cta Abd/pel W/cm &/or W/o Cm  12/01/2015  CLINICAL DATA:  Larey Seat Monday, bilateral orbital hematomas, abrasion to left zygoma. Patient describes right axillary pain extending into back. Patient on blood thinners. EXAM: CT ANGIOGRAPHY CHEST, ABDOMEN AND PELVIS TECHNIQUE: Multidetector CT imaging through the chest, abdomen and pelvis was performed using the standard protocol during bolus administration of intravenous contrast. Multiplanar reconstructed images and MIPs were obtained and reviewed to evaluate the vascular anatomy. CONTRAST:  100 cc Isovue 370 COMPARISON:  None. FINDINGS: CTA CHEST FINDINGS Thoracic aorta appears intact  and normal in configuration. No aneurysm or dissection. Scattered atherosclerotic changes seen along the walls of the thoracic aorta. No pulmonary embolism identified. Heart size is upper normal. Patient is status post median sternotomy for presumed CABG. Diffuse coronary artery calcifications are noted. No pericardial effusion. No pulmonary infiltrate, mass or effusion. No pneumothorax. Trachea and central bronchi are unremarkable. No masses or enlarged lymph nodes seen within the mediastinum or perihilar regions. No acute or suspicious osseous lesion. No rib fracture or displacement. Mild degenerative spurring noted throughout the scoliotic thoracic spine. Review of the MIP images confirms the above findings. CTA ABDOMEN AND PELVIS FINDINGS Atherosclerotic changes are seen along the walls of the normal-caliber abdominal aorta, at the origins of the aortic branch vessels and throughout the pelvic vasculature. No arterial occlusion seen. No aneurysm or dissection. Liver, gallbladder, spleen, pancreas and adrenal glands appear normal. Areas of scarring noted within each renal cortex. No renal stone or hydronephrosis. No ureteral or bladder calculi identified. There is common bile duct dilatation measuring 12 mm diameter. No bile duct stone seen. No  significant intrahepatic bile duct dilatation. Bowel is normal in caliber. No bowel wall thickening or evidence of bowel wall inflammation seen. Moderate amount of stool and gas throughout the colon. Appendix is not seen but there are no inflammatory changes about the cecum to suggest acute appendicitis. Small free fluid in the lower pelvis. No other free fluid seen. No abscess collection. No free intraperitoneal air. There are 4 lumbar type vertebral bodies. Based on this numbering, there is a compression fracture deformity of the L1 vertebral body which is of uncertain age but favored to be chronic. No acute-appearing osseous abnormality seen. Review of the MIP images  confirms the above findings. IMPRESSION: 1. No acute- appearing abnormality of the thoracic or abdominal aorta. No aortic aneurysm or dissection. No evidence of traumatic aortic injury. 2. Overall, no evidence of acute intrathoracic abnormality. Chronic findings detailed above. 3. Common bile duct dilatation measuring up to 12 mm diameter. No obstructing bile duct stone identified. Recommend correlation with liver function tests. If abnormal, would consider MRCP for further characterization. 4. Compression fracture deformity of the L1 vertebral body, approximately 20% compressed anteriorly, of uncertain age but favored to be chronic. 5. Additional chronic/incidental findings detailed above. Electronically Signed   By: Bary RichardStan  Maynard M.D.   On: 12/01/2015 14:34   [place] Abdomen Limited Ruq  12/01/2015  CLINICAL DATA:  Abdominal pain for 1 day. EXAM: US ABDOMEN LIMITED - RIGHT UPPER QUADRANT COMPARISON:  CT abdomen from earlier same day. FINDINGS: Gallbladder: No gallstones seen. There is no gallbladder wall thickening, pericholecystic fluid or other secondary sonographic signs of acute cholecystitis. No sonographic Murphy's sign elicited during the examination, per the sonographer. Common bile duct: Diameter: At the level of the porta hepatis, the common bile duct is upper normal at 8 mm diameter. There is more significant dilatation of the distal common bile duct measuring up to 11 mm diameter, compatible with the 12 mm measurement on earlier CT. No bile duct stone identified. No significant intrahepatic bile duct dilatation identified. Liver: No focal lesion identified. Within normal limits in parenchymal echogenicity. IMPRESSION: 1. Common bile duct dilatation, measuring up to 11 mm diameter, of uncertain etiology. No obstructing bile duct stone or mass identified on this exam or on the earlier CT. On the earlier CT, there appears to be appropriate tapering of the distal-most common bile duct. Consider correlation  with liver function tests and, if abnormal, MRCP. 2. Gallbladder appears normal. No gallstones seen. No evidence of cholecystitis. Electronically Signed   By: Bary RichardStan  Maynard M.D.   On: 12/01/2015 18:46    2D ECHO: Study Conclusions  - Left ventricle: The cavity size was normal. Wall thickness was  increased in a pattern of mild LVH. Systolic function was normal.  The estimated ejection fraction was in the range of 60% to 65%.  Wall motion was normal; there were no regional wall motion  abnormalities. Doppler parameters are consistent with abnormal  left ventricular relaxation (grade 1 diastolic dysfunction). The  E/e&' ratio is between 8-15, suggesting indeterminate LV filling  pressure. - Left atrium: The atrium was normal in size. - Tricuspid valve: There was mild regurgitation. - Pulmonary arteries: PA peak pressure: 35 mm Hg (S). - Inferior vena cava: The vessel was normal in size. The  respirophasic diameter changes were in the normal range (>= 50%),  consistent with normal central venous pressure.  Impressions:  - LVEF 60-65%, mild LVH, normal wall motion, diastolic dysfunction,  indeterminate LV filling pressure, normal LA size, mild  TR, RVSP  35 mmHg.  Nuclear medicine stress test  FINDINGS: Perfusion: Moderate sized fixed defect, severe in the apex and moderate in the inferolateral wall, without reversibility. No findings to suggest reversible ischemia.  Wall Motion: Mild global hypokinesia. Severe left ventricular dilatation.  Left Ventricular Ejection Fraction: 33 %  End diastolic volume 142 ml  End systolic volume 95 ml  IMPRESSION: 1. Moderate fixed defect involving the apex and inferolateral wall, compatible with the clinical history of prior infarct. No findings to suggest reversible ischemia.  2. Mild global hypokinesia with severe left ventricular dilatation.  3. Left ventricular ejection fraction 33%.  4. High-risk stress test  findings*, on the basis of low ejection fraction.    Disposition and Follow-up: Discharge Instructions    Diet - low sodium heart healthy    Complete by:  As directed      Increase activity slowly    Complete by:  As directed             DISPOSITION: snf    DISCHARGE FOLLOW-UP Follow-up Information    Follow up with Bensimhon, Daniel, MD. Schedule an appointment as soon as possible for a visit in 2 weeks.   Specialty:  Cardiology   Contact information:   [address] [place] [address] [phone]       Follow up with Leanor Rubenstein, MD. Go on 12/17/2015.   Specialty:  Family Medicine   Why:  for hospital follow-up @ 2pm   Contact information:   [address] [place] [address] [phone]        Time spent on Discharge:   Signed:   Nijel Flink M.D. [facility] 12/03/2015, 3:54 PM Pager: [phone]

## 2015-12-04 ENCOUNTER — Encounter: Payer: Self-pay | Admitting: Nurse Practitioner

## 2015-12-04 ENCOUNTER — Non-Acute Institutional Stay: Payer: Medicare PPO | Admitting: Nurse Practitioner

## 2015-12-04 DIAGNOSIS — I10 Essential (primary) hypertension: Secondary | ICD-10-CM

## 2015-12-04 DIAGNOSIS — I69398 Other sequelae of cerebral infarction: Secondary | ICD-10-CM | POA: Diagnosis not present

## 2015-12-04 DIAGNOSIS — G40909 Epilepsy, unspecified, not intractable, without status epilepticus: Secondary | ICD-10-CM | POA: Diagnosis not present

## 2015-12-04 DIAGNOSIS — K838 Other specified diseases of biliary tract: Secondary | ICD-10-CM | POA: Diagnosis not present

## 2015-12-04 DIAGNOSIS — S32010S Wedge compression fracture of first lumbar vertebra, sequela: Secondary | ICD-10-CM

## 2015-12-04 DIAGNOSIS — I209 Angina pectoris, unspecified: Secondary | ICD-10-CM

## 2015-12-04 DIAGNOSIS — K5900 Constipation, unspecified: Secondary | ICD-10-CM

## 2015-12-04 DIAGNOSIS — I69359 Hemiplegia and hemiparesis following cerebral infarction affecting unspecified side: Secondary | ICD-10-CM

## 2015-12-04 NOTE — Assessment & Plan Note (Signed)
Controlled, continue Lisinopril 20mg  and Metoprolol 50mg  daily.

## 2015-12-04 NOTE — Assessment & Plan Note (Addendum)
Had both some typical and atypical features , In the setting of CAD status post CABG in 1997  -Serial troponin showed 1 elevated troponin 0.05, hence Cardiology was consulted - Patient underwent stress test which showed moderate fixed defect involving the apex, inferolateral wall, prior infarct, no findings to suggest reversible ischemia, mild global hypokinesia with severe left ventricular dilatation, EF 33%.  - 2D- ECHO EF showed EF of 60-65% with grade 1 diastolic dysfunction - patient will need outpatient follow-up with cardiology. Message sent to Trish.  -Continue aspirin, Plavix and beta blocker as well as statin - update CBC BMP TSH

## 2015-12-04 NOTE — Progress Notes (Signed)
Patient ID: [name] [name], female   DOB: [birth year], 75 y.o.   MRN: [id number]  Location:  [facility] Room Number: 411 A Place of Service:  SNF (31) Provider: Arna Snipe Shama Monfils NP  Leanor Rubenstein, MD  Patient Care Team: Deatra James, MD as PCP - General (Family Medicine) Graylin Shiver, MD as Consulting Physician (Gastroenterology)  Extended Emergency Contact Information Primary Emergency Contact: Krasinski,Sherry Address: [address]          [address] [place], [place] [address] [place] Home Phone: [phone] Mobile Phone: [phone] Relation: Daughter  Code Status:  DNR Goals of care: Advanced Directive information Advanced Directives 12/04/2015  Does patient have an advance directive? No  Type of Advance Directive -  Does patient want to make changes to advanced directive? No - Patient declined  Copy of advanced directive(s) in chart? No - copy requested  Would patient like information on creating an advanced directive? -     Chief Complaint  Patient presents with  . Medical Management of Chronic Issues    Routine Visit    HPI:  Pt is a 75 y.o. female seen today for f/u hospital stay 12/01/15 to 12/03/15 for chest pain, cardiology consulted, stress test and echocardiogram done, patient will need outpatient follow-up with cardiology, will continue BCB, Plavix, ASA, Statin. CT angio chest, abd, pelvis, showed    Past Medical History  Diagnosis Date  . CAD (coronary artery disease)   . Stroke (HCC)   . Hypertension   . Hypercholesterolemia   . Peroneal DVT (deep venous thrombosis) (HCC)   . Osteoporosis   . Seizures (HCC)   . Post-menopausal   . Heart attack (HCC)   . Peripheral vascular disease (HCC)     Left superficial femoral artery  . Hemiparesis and alteration of sensations as late effects of stroke (HCC) 03/14/2013  . Headache(784.0) 03/14/2013  . Cervical spine fracture [facility]     Not requiring surgery   Past Surgical  History  Procedure Laterality Date  . Cataract extraction    . Coronary artery bypass graft      x3    Allergies  Allergen Reactions  . Latex Rash  . Penicillins Rash    Has patient had a PCN reaction causing immediate rash, facial/tongue/throat swelling, SOB or lightheadedness with hypotension: No Has patient had a PCN reaction causing severe rash involving mucus membranes or skin necrosis: No Has patient had a PCN reaction that required hospitalization No Has patient had a PCN reaction occurring within the last 10 years: No If all of the above answers are "NO", then may proceed with Cephalosporin use.  . Tape Rash      Medication List       This list is accurate as of: 12/04/15 11:59 PM.  Always use your most recent med list.               acetaminophen 500 MG tablet  Commonly known as:  TYLENOL  Take 500-1,000 mg by mouth every 8 (eight) hours as needed for mild pain.     aspirin EC 81 MG tablet  Take 1 tablet (81 mg total) by mouth daily.     atorvastatin 20 MG tablet  Commonly known as:  LIPITOR  Take 20 mg by mouth every evening.     cholecalciferol 1000 units tablet  Commonly known as:  VITAMIN D  Take 1,000 Units by  mouth daily.     clopidogrel 75 MG tablet  Commonly known as:  PLAVIX  Take 75 mg by mouth daily.     lamoTRIgine 100 MG tablet  Commonly known as:  LAMICTAL  TAKE 1 TABLET(100 MG) BY MOUTH TWICE DAILY     lisinopril 20 MG tablet  Commonly known as:  PRINIVIL,ZESTRIL  Take 20 mg by mouth at bedtime.     metoprolol succinate 50 MG 24 hr tablet  Commonly known as:  TOPROL-XL  Take 50 mg by mouth daily. Take with or immediately following a meal.     nitroGLYCERIN 0.4 MG SL tablet  Commonly known as:  NITROSTAT  Place 1 tablet (0.4 mg total) under the tongue every 5 (five) minutes as needed for chest pain (CP or SOB).        Review of Systems  Constitutional: Positive for activity change. Negative for fever, chills, diaphoresis,  appetite change, fatigue and unexpected weight change.  HENT: Negative for congestion, dental problem, drooling, ear discharge, ear pain, facial swelling, hearing loss and mouth sores.   Eyes: Negative for photophobia, pain, discharge, redness and itching.  Respiratory: Negative for apnea, cough, choking, chest tightness, shortness of breath, wheezing and stridor.   Cardiovascular: Negative for chest pain, palpitations and leg swelling.  Gastrointestinal: Positive for constipation. Negative for nausea, abdominal pain, diarrhea, blood in stool, abdominal distention, anal bleeding and rectal pain.  Endocrine: Negative for cold intolerance, heat intolerance, polydipsia, polyphagia and polyuria.  Genitourinary: Positive for frequency. Negative for dysuria, flank pain, difficulty urinating and dyspareunia.  Musculoskeletal: Negative for myalgias, back pain, joint swelling, arthralgias, gait problem, neck pain and neck stiffness.  Skin: Negative for color change, pallor and rash.  Allergic/Immunologic: Negative for environmental allergies.  Neurological: Positive for speech difficulty, weakness and numbness. Negative for dizziness, facial asymmetry and headaches.       Left sided paralysis,      There is no immunization history on file for this patient. Pertinent  Health Maintenance Due  Topic Date Due  . MAMMOGRAM  03/30/1993  . DEXA SCAN  03/30/2008  . PNA vac Low Risk Adult (1 of 2 - PCV13) 03/30/2008  . INFLUENZA VACCINE  03/31/2016  . COLONOSCOPY  06/27/2023   No flowsheet data found. Functional Status Survey:    Filed Vitals:   12/04/15 1449  BP: 124/70  Pulse: 74  Temp: 98 F (36.7 C)  TempSrc: Oral  Resp: 18  Height: 5\' 9"  (1.753 m)  Weight: 160 lb 4.3 oz (72.698 kg)   Body mass index is 23.66 kg/(m^2). Physical Exam  Constitutional: She is oriented to person, place, and time. She appears well-developed and well-nourished.  HENT:  Head: Normocephalic and atraumatic.    Right Ear: External ear normal.  Left Ear: External ear normal.  Nose: Nose normal.  Mouth/Throat: Oropharynx is clear and moist.  Eyes: Conjunctivae and EOM are normal. Pupils are equal, round, and reactive to light. Right eye exhibits no discharge. Left eye exhibits no discharge.  Neck: Normal range of motion. Neck supple. No JVD present. No tracheal deviation present. No thyromegaly present.  Cardiovascular: Normal rate, normal heart sounds and intact distal pulses.   No murmur heard. Pulmonary/Chest: No stridor. No respiratory distress. She has no wheezes. She has no rales. She exhibits no tenderness.  Abdominal: She exhibits no distension. There is no tenderness. There is no rebound and no guarding.  Musculoskeletal: She exhibits no edema or tenderness.  Neurological: She is alert and oriented  to person, place, and time. She displays abnormal reflex. No cranial nerve deficit. She exhibits abnormal muscle tone. Coordination abnormal.  Left sided paralysis, muscle strength 3/5  Skin: Skin is warm and dry. No rash noted. No erythema.  Psychiatric: She has a normal mood and affect. Her behavior is normal. Judgment and thought content normal.    Labs reviewed:  Recent Labs  04/02/15 0850 12/01/15 1227 12/01/15 1235 12/03/15 0357  NA 139 141 143 141  K 3.7 4.0 4.1 4.3  CL 106 106 104 105  CO2 23 27  --  25  GLUCOSE 101* 102* 94 95  BUN 11 8 11 12   CREATININE 0.83 0.78 0.70 0.82  CALCIUM 9.1 9.7  --  9.2    Recent Labs  12/01/15 1634  AST 24  ALT 22  ALKPHOS 77  BILITOT 0.5  PROT 7.0  ALBUMIN 3.4*    Recent Labs  04/02/15 0850 12/01/15 1227 12/01/15 1235 12/03/15 0357  WBC 4.8 4.0  --  5.2  HGB 13.4 13.8 15.6* 12.0  HCT 42.2 44.3 46.0 37.1  MCV 89.0 87.0  --  87.1  PLT 154 193  --  192   No results found for: TSH No results found for: HGBA1C Lab Results  Component Value Date   CHOL 153 12/02/2015   HDL 41 12/02/2015   LDLCALC 101* 12/02/2015   TRIG 57  12/02/2015   CHOLHDL 3.7 12/02/2015    Significant Diagnostic Results in last 30 days:  Ct Head Wo Contrast  12/01/2015  CLINICAL DATA:  Bilateral orbital hematomas after fall. No loss of consciousness. EXAM: CT HEAD WITHOUT CONTRAST TECHNIQUE: Contiguous axial images were obtained from the base of the skull through the vertex without intravenous contrast. COMPARISON:  CT scan of September 29, 2013. FINDINGS: Bony calvarium appears intact. Encephalomalacia is again noted in the right MCA territory consistent with old infarction. No mass effect or midline shift is noted. Ventricular size is within normal limits. There is no evidence of mass lesion, hemorrhage or acute infarction. IMPRESSION: Old right MCA infarction.  No acute intracranial abnormality seen. Electronically Signed   By: Lupita Raider, M.D.   On: 12/01/2015 14:14   Nm Myocar Multi W/spect W/wall Motion / Ef  12/02/2015  CLINICAL DATA:  CAD, prior MI, status post 3 vessel CABG EXAM: MYOCARDIAL IMAGING WITH SPECT (REST AND PHARMACOLOGIC-STRESS) GATED LEFT VENTRICULAR WALL MOTION STUDY LEFT VENTRICULAR EJECTION FRACTION TECHNIQUE: Standard myocardial SPECT imaging was performed after resting intravenous injection of 10 mCi Tc-3m sestamibi. Subsequently, intravenous infusion of Lexiscan was performed under the supervision of the Cardiology staff. At peak effect of the drug, 30 mCi Tc-40m sestamibi was injected intravenously and standard myocardial SPECT imaging was performed. Quantitative gated imaging was also performed to evaluate left ventricular wall motion, and estimate left ventricular ejection fraction. COMPARISON:  None. FINDINGS: Perfusion: Moderate sized fixed defect, severe in the apex and moderate in the inferolateral wall, without reversibility. No findings to suggest reversible ischemia. Wall Motion: Mild global hypokinesia. Severe left ventricular dilatation. Left Ventricular Ejection Fraction: 33 % End diastolic volume 142 ml End  systolic volume 95 ml IMPRESSION: 1. Moderate fixed defect involving the apex and inferolateral wall, compatible with the clinical history of prior infarct. No findings to suggest reversible ischemia. 2. Mild global hypokinesia with severe left ventricular dilatation. 3. Left ventricular ejection fraction 33%. 4. High-risk stress test findings*, on the basis of low ejection fraction. *2012 Appropriate Use Criteria for Coronary Revascularization Focused Update:  J Am Coll Cardiol. 2012;59(9):857-881. http://content.dementiazones.com.aspx?articleid=1201161 Electronically Signed   By: Charline Bills M.D.   On: 12/02/2015 16:55   Dg Chest Portable 1 View  12/01/2015  CLINICAL DATA:  RIGHT-sided chest pain and arm pain for unknown period of time, coronary artery disease, hypertension, prior stroke EXAM: PORTABLE CHEST 1 VIEW COMPARISON:  Portable exam 1216 hours compared to 04/02/2015 FINDINGS: Upper normal heart size post median sternotomy. Mediastinal contours and pulmonary vascularity normal. Lungs clear. No pleural effusion or pneumothorax. Bones unremarkable. IMPRESSION: No acute abnormalities. Electronically Signed   By: Ulyses Southward M.D.   On: 12/01/2015 12:50   Ct Angio Chest Aorta W/cm &/or Wo/cm  12/01/2015  CLINICAL DATA:  Larey Seat Monday, bilateral orbital hematomas, abrasion to left zygoma. Patient describes right axillary pain extending into back. Patient on blood thinners. EXAM: CT ANGIOGRAPHY CHEST, ABDOMEN AND PELVIS TECHNIQUE: Multidetector CT imaging through the chest, abdomen and pelvis was performed using the standard protocol during bolus administration of intravenous contrast. Multiplanar reconstructed images and MIPs were obtained and reviewed to evaluate the vascular anatomy. CONTRAST:  100 cc Isovue 370 COMPARISON:  None. FINDINGS: CTA CHEST FINDINGS Thoracic aorta appears intact and normal in configuration. No aneurysm or dissection. Scattered atherosclerotic changes seen along the walls  of the thoracic aorta. No pulmonary embolism identified. Heart size is upper normal. Patient is status post median sternotomy for presumed CABG. Diffuse coronary artery calcifications are noted. No pericardial effusion. No pulmonary infiltrate, mass or effusion. No pneumothorax. Trachea and central bronchi are unremarkable. No masses or enlarged lymph nodes seen within the mediastinum or perihilar regions. No acute or suspicious osseous lesion. No rib fracture or displacement. Mild degenerative spurring noted throughout the scoliotic thoracic spine. Review of the MIP images confirms the above findings. CTA ABDOMEN AND PELVIS FINDINGS Atherosclerotic changes are seen along the walls of the normal-caliber abdominal aorta, at the origins of the aortic branch vessels and throughout the pelvic vasculature. No arterial occlusion seen. No aneurysm or dissection. Liver, gallbladder, spleen, pancreas and adrenal glands appear normal. Areas of scarring noted within each renal cortex. No renal stone or hydronephrosis. No ureteral or bladder calculi identified. There is common bile duct dilatation measuring 12 mm diameter. No bile duct stone seen. No significant intrahepatic bile duct dilatation. Bowel is normal in caliber. No bowel wall thickening or evidence of bowel wall inflammation seen. Moderate amount of stool and gas throughout the colon. Appendix is not seen but there are no inflammatory changes about the cecum to suggest acute appendicitis. Small free fluid in the lower pelvis. No other free fluid seen. No abscess collection. No free intraperitoneal air. There are 4 lumbar type vertebral bodies. Based on this numbering, there is a compression fracture deformity of the L1 vertebral body which is of uncertain age but favored to be chronic. No acute-appearing osseous abnormality seen. Review of the MIP images confirms the above findings. IMPRESSION: 1. No acute- appearing abnormality of the thoracic or abdominal aorta.  No aortic aneurysm or dissection. No evidence of traumatic aortic injury. 2. Overall, no evidence of acute intrathoracic abnormality. Chronic findings detailed above. 3. Common bile duct dilatation measuring up to 12 mm diameter. No obstructing bile duct stone identified. Recommend correlation with liver function tests. If abnormal, would consider MRCP for further characterization. 4. Compression fracture deformity of the L1 vertebral body, approximately 20% compressed anteriorly, of uncertain age but favored to be chronic. 5. Additional chronic/incidental findings detailed above. Electronically Signed   By: Bary Richard  M.D.   On: 12/01/2015 14:34   Ct Cta Abd/pel W/cm &/or W/o Cm  12/01/2015  CLINICAL DATA:  Larey Seat Monday, bilateral orbital hematomas, abrasion to left zygoma. Patient describes right axillary pain extending into back. Patient on blood thinners. EXAM: CT ANGIOGRAPHY CHEST, ABDOMEN AND PELVIS TECHNIQUE: Multidetector CT imaging through the chest, abdomen and pelvis was performed using the standard protocol during bolus administration of intravenous contrast. Multiplanar reconstructed images and MIPs were obtained and reviewed to evaluate the vascular anatomy. CONTRAST:  100 cc Isovue 370 COMPARISON:  None. FINDINGS: CTA CHEST FINDINGS Thoracic aorta appears intact and normal in configuration. No aneurysm or dissection. Scattered atherosclerotic changes seen along the walls of the thoracic aorta. No pulmonary embolism identified. Heart size is upper normal. Patient is status post median sternotomy for presumed CABG. Diffuse coronary artery calcifications are noted. No pericardial effusion. No pulmonary infiltrate, mass or effusion. No pneumothorax. Trachea and central bronchi are unremarkable. No masses or enlarged lymph nodes seen within the mediastinum or perihilar regions. No acute or suspicious osseous lesion. No rib fracture or displacement. Mild degenerative spurring noted throughout the  scoliotic thoracic spine. Review of the MIP images confirms the above findings. CTA ABDOMEN AND PELVIS FINDINGS Atherosclerotic changes are seen along the walls of the normal-caliber abdominal aorta, at the origins of the aortic branch vessels and throughout the pelvic vasculature. No arterial occlusion seen. No aneurysm or dissection. Liver, gallbladder, spleen, pancreas and adrenal glands appear normal. Areas of scarring noted within each renal cortex. No renal stone or hydronephrosis. No ureteral or bladder calculi identified. There is common bile duct dilatation measuring 12 mm diameter. No bile duct stone seen. No significant intrahepatic bile duct dilatation. Bowel is normal in caliber. No bowel wall thickening or evidence of bowel wall inflammation seen. Moderate amount of stool and gas throughout the colon. Appendix is not seen but there are no inflammatory changes about the cecum to suggest acute appendicitis. Small free fluid in the lower pelvis. No other free fluid seen. No abscess collection. No free intraperitoneal air. There are 4 lumbar type vertebral bodies. Based on this numbering, there is a compression fracture deformity of the L1 vertebral body which is of uncertain age but favored to be chronic. No acute-appearing osseous abnormality seen. Review of the MIP images confirms the above findings. IMPRESSION: 1. No acute- appearing abnormality of the thoracic or abdominal aorta. No aortic aneurysm or dissection. No evidence of traumatic aortic injury. 2. Overall, no evidence of acute intrathoracic abnormality. Chronic findings detailed above. 3. Common bile duct dilatation measuring up to 12 mm diameter. No obstructing bile duct stone identified. Recommend correlation with liver function tests. If abnormal, would consider MRCP for further characterization. 4. Compression fracture deformity of the L1 vertebral body, approximately 20% compressed anteriorly, of uncertain age but favored to be chronic.  5. Additional chronic/incidental findings detailed above. Electronically Signed   By: Bary Richard M.D.   On: 12/01/2015 14:34   US Abdomen Limited Ruq  12/01/2015  CLINICAL DATA:  Abdominal pain for 1 day. EXAM: US ABDOMEN LIMITED - RIGHT UPPER QUADRANT COMPARISON:  CT abdomen from earlier same day. FINDINGS: Gallbladder: No gallstones seen. There is no gallbladder wall thickening, pericholecystic fluid or other secondary sonographic signs of acute cholecystitis. No sonographic Murphy's sign elicited during the examination, per the sonographer. Common bile duct: Diameter: At the level of the porta hepatis, the common bile duct is upper normal at 8 mm diameter. There is more significant dilatation of  the distal common bile duct measuring up to 11 mm diameter, compatible with the 12 mm measurement on earlier CT. No bile duct stone identified. No significant intrahepatic bile duct dilatation identified. Liver: No focal lesion identified. Within normal limits in parenchymal echogenicity. IMPRESSION: 1. Common bile duct dilatation, measuring up to 11 mm diameter, of uncertain etiology. No obstructing bile duct stone or mass identified on this exam or on the earlier CT. On the earlier CT, there appears to be appropriate tapering of the distal-most common bile duct. Consider correlation with liver function tests and, if abnormal, MRCP. 2. Gallbladder appears normal. No gallstones seen. No evidence of cholecystitis. Electronically Signed   By: Bary Richard M.D.   On: 12/01/2015 18:46    Assessment/Plan  Chest pain Had both some typical and atypical features , In the setting of CAD status post CABG in 1997  -Serial troponin showed 1 elevated troponin 0.05, hence Cardiology was consulted - Patient underwent stress test which showed moderate fixed defect involving the apex, inferolateral wall, prior infarct, no findings to suggest reversible ischemia, mild global hypokinesia with severe left ventricular  dilatation, EF 33%.  - 2D- ECHO EF showed EF of 60-65% with grade 1 diastolic dysfunction - patient will need outpatient follow-up with cardiology. Message sent to Trish.  -Continue aspirin, Plavix and beta blocker as well as statin - update CBC BMP TSH   HTN (hypertension) Controlled, continue Lisinopril 20mg  and Metoprolol 50mg  daily.   Seizure disorder (HCC) Continue Lamictal 100mg  bid, continue to monitor for seizure activity.   Constipation MiraLax daily.   Hemiparesis and alteration of sensations as late effects of stroke [facility]) -Last evaluation at neurology office was on 3/17 - Continue aspirin, Plavix, statin  Common bile duct dilatation -No intrahepatic dilatation and no evidence of cholelithiasis on CT -No abdominal pain or GI symptoms, amylase minimally elevated may have passed a stone with CBD dilatation. No choledocholithiasis or cholelithiasis on ultrasound. Update CMP   Compression fracture of L1 lumbar vertebra (HCC) Compression ssion fracture of L1 lumbar vertebra  -No reports of chronic back pain    Family/ staff Communication: may [place] or [place] when she is able.   Labs/tests ordered: CBC, BMP, TSH

## 2015-12-04 NOTE — Assessment & Plan Note (Signed)
Continue Lamictal 100mg  bid, continue to monitor for seizure activity.

## 2015-12-05 DIAGNOSIS — K5900 Constipation, unspecified: Secondary | ICD-10-CM | POA: Insufficient documentation

## 2015-12-05 NOTE — Assessment & Plan Note (Signed)
Compression ssion fracture of L1 lumbar vertebra  -No reports of chronic back pain  

## 2015-12-05 NOTE — Assessment & Plan Note (Signed)
MiraLax daily.  

## 2015-12-05 NOTE — Assessment & Plan Note (Signed)
-  Last evaluation at neurology office was on 3/17 - Continue aspirin, Plavix, statin

## 2015-12-05 NOTE — Assessment & Plan Note (Addendum)
-  No intrahepatic dilatation and no evidence of cholelithiasis on CT -No abdominal pain or GI symptoms, amylase minimally elevated may have passed a stone with CBD dilatation. No choledocholithiasis or cholelithiasis on ultrasound. Update CMP

## 2015-12-06 ENCOUNTER — Non-Acute Institutional Stay (SKILLED_NURSING_FACILITY): Payer: Medicare PPO | Admitting: Internal Medicine

## 2015-12-06 ENCOUNTER — Encounter: Payer: Self-pay | Admitting: Internal Medicine

## 2015-12-06 DIAGNOSIS — G40909 Epilepsy, unspecified, not intractable, without status epilepticus: Secondary | ICD-10-CM | POA: Diagnosis not present

## 2015-12-06 DIAGNOSIS — H05232 Hemorrhage of left orbit: Secondary | ICD-10-CM

## 2015-12-06 DIAGNOSIS — R32 Unspecified urinary incontinence: Secondary | ICD-10-CM | POA: Insufficient documentation

## 2015-12-06 DIAGNOSIS — R569 Unspecified convulsions: Secondary | ICD-10-CM

## 2015-12-06 DIAGNOSIS — Y92099 Unspecified place in other non-institutional residence as the place of occurrence of the external cause: Secondary | ICD-10-CM | POA: Diagnosis not present

## 2015-12-06 DIAGNOSIS — Y92009 Unspecified place in unspecified non-institutional (private) residence as the place of occurrence of the external cause: Secondary | ICD-10-CM

## 2015-12-06 DIAGNOSIS — I10 Essential (primary) hypertension: Secondary | ICD-10-CM

## 2015-12-06 DIAGNOSIS — M21372 Foot drop, left foot: Secondary | ICD-10-CM | POA: Diagnosis not present

## 2015-12-06 DIAGNOSIS — I209 Angina pectoris, unspecified: Secondary | ICD-10-CM

## 2015-12-06 DIAGNOSIS — W19XXXA Unspecified fall, initial encounter: Secondary | ICD-10-CM | POA: Diagnosis not present

## 2015-12-06 DIAGNOSIS — R269 Unspecified abnormalities of gait and mobility: Secondary | ICD-10-CM

## 2015-12-06 NOTE — Progress Notes (Signed)
Patient ID: [name] [name], female   DOB: [birth year], 75 y.o.   MRN: [id number]  History and physical  Provider:  Murray Hodgkins, M.D. Location:  [facility] Room Number: 411A Place of Service:  SNF (31)  PCP: Leanor Rubenstein, MD Patient Care Team: Deatra James, MD as PCP - General (Family Medicine) Graylin Shiver, MD as Consulting Physician (Gastroenterology)  Extended Emergency Contact Information Primary Emergency Contact: Pedone,Sherry Address: [address]          [address] [place], [place] [address] [place] Home Phone: [phone] Mobile Phone: [phone] Relation: Daughter  Code Status: full Goals of Care: Advanced Directive information Advanced Directives 12/04/2015  Does patient have an advance directive? No  Type of Advance Directive -  Does patient want to make changes to advanced directive? No - Patient declined  Copy of advanced directive(s) in chart? No - copy requested  Would patient like information on creating an advanced directive? -      Chief Complaint  Patient presents with  . New Admit To SNF    following hospitalization 12/01/15 to 12/03/15 chest pain, hypertension    HPI: Patient is a 75 y.o. female [facility] hospitalization from 12/01/2015 through 12/03/2015. Patient in the hospital with chest pain. Myocardial infarction was ruled out. He had a prior history of coronary artery disease and coronary artery bypass grafting.  Patient has additional history of vascular disease with a stroke about 5 years ago that caused left hemiparesis and some dysphagia.  Patient is chronically hypertensive but is seems under adequate control now, although it was quite elevated at admission to hospital.  Patient felt weak after her recent events and is admitted to the skilled nursing facility for short-term rehabilitation. She lives with her daughter. Prior to hospitalization, she was able  to walk with a hemiwalker and was reasonably self-sufficient. She did have assistance to get in and out of the bathroom and showering. Her daughter assists with medication supervision. She needs help with meal preparation, but she feeds herself.  Past Medical History  Diagnosis Date  . CAD (coronary artery disease)   . Stroke (HCC)   . Hypertension   . Hypercholesterolemia   . Peroneal DVT (deep venous thrombosis) (HCC)   . Osteoporosis   . Seizures (HCC)   . Post-menopausal   . Heart attack (HCC)   . Peripheral vascular disease (HCC)     Left superficial femoral artery  . Hemiparesis and alteration of sensations as late effects of stroke (HCC) 03/14/2013  . Headache(784.0) 03/14/2013  . Cervical spine fracture [facility])     Not requiring surgery   Past Surgical History  Procedure Laterality Date  . Cataract extraction    . Coronary artery bypass graft      x3    reports that she has never smoked. She has never used smokeless tobacco. She reports that she does not drink alcohol or use illicit drugs. Social History   Social History  . Marital Status: Widowed    Spouse Name: N/A  . Number of Children: 5  . Years of Education: 10   Occupational History  . Retired    Social History Main Topics  . Smoking status: Never Smoker   . Smokeless tobacco: Never Used  . Alcohol Use: No  . Drug Use: No  . Sexual Activity: Not on file   Other Topics  Concern  . Not on file   Social History Narrative   Patient is a widowed and lives with her daughter [name].   Patient has three living children and two are deceased.   Patient is retired.   Patient has a 10th grade education.   Patient is right-handed.   Patient occasionally drinks caffeine.          Family History  Problem Relation Age of Onset  . Heart attack Mother   . Heart attack Father   . Heart attack Sister   . Heart attack Brother     Health Maintenance  Topic Date Due  . Janet Berlin  03/30/1962  . MAMMOGRAM   03/30/1993  . ZOSTAVAX  03/31/2003  . DEXA SCAN  03/30/2008  . PNA vac Low Risk Adult (1 of 2 - PCV13) 03/30/2008  . INFLUENZA VACCINE  03/31/2016  . COLONOSCOPY  06/27/2023    Allergies  Allergen Reactions  . Latex Rash  . Penicillins Rash    Has patient had a PCN reaction causing immediate rash, facial/tongue/throat swelling, SOB or lightheadedness with hypotension: No Has patient had a PCN reaction causing severe rash involving mucus membranes or skin necrosis: No Has patient had a PCN reaction that required hospitalization No Has patient had a PCN reaction occurring within the last 10 years: No If all of the above answers are "NO", then may proceed with Cephalosporin use.  . Tape Rash      Medication List       This list is accurate as of: 12/06/15  2:39 PM.  Always use your most recent med list.               acetaminophen 500 MG tablet  Commonly known as:  TYLENOL  Take 500-1,000 mg by mouth every 8 (eight) hours as needed for mild pain.     aspirin EC 81 MG tablet  Take 1 tablet (81 mg total) by mouth daily.     atorvastatin 20 MG tablet  Commonly known as:  LIPITOR  Take 20 mg by mouth every evening.     cholecalciferol 1000 units tablet  Commonly known as:  VITAMIN D  Take 1,000 Units by mouth daily.     clopidogrel 75 MG tablet  Commonly known as:  PLAVIX  Take 75 mg by mouth daily.     lamoTRIgine 100 MG tablet  Commonly known as:  LAMICTAL  TAKE 1 TABLET(100 MG) BY MOUTH TWICE DAILY     lisinopril 20 MG tablet  Commonly known as:  PRINIVIL,ZESTRIL  Take 20 mg by mouth at bedtime.     metoprolol succinate 50 MG 24 hr tablet  Commonly known as:  TOPROL-XL  Take 50 mg by mouth daily. Take with or immediately following a meal.     nitroGLYCERIN 0.4 MG SL tablet  Commonly known as:  NITROSTAT  Place 1 tablet (0.4 mg total) under the tongue every 5 (five) minutes as needed for chest pain (CP or SOB).        Review of Systems  Constitutional:  Positive for activity change. Negative for fever, chills, diaphoresis, appetite change, fatigue and unexpected weight change.  HENT: Negative for congestion, dental problem, drooling, ear discharge, ear pain, facial swelling, hearing loss and mouth sores.   Eyes: Negative for photophobia, pain, discharge, redness and itching.  Respiratory: Negative for apnea, cough, choking, chest tightness, shortness of breath, wheezing and stridor.   Cardiovascular: Negative for chest pain, palpitations and leg swelling.  History coronary artery disease and CABG.  Gastrointestinal: Positive for constipation. Negative for nausea, abdominal pain, diarrhea, blood in stool, abdominal distention, anal bleeding and rectal pain.  Endocrine: Negative for cold intolerance, heat intolerance, polydipsia, polyphagia and polyuria.  Genitourinary: Positive for frequency. Negative for dysuria, flank pain, difficulty urinating and dyspareunia.  Musculoskeletal: Negative for myalgias, back pain, joint swelling, arthralgias, gait problem, neck pain and neck stiffness.       Unstable gait and use of hemiwalker  Skin: Negative for color change, pallor and rash.       Left periorbital hematoma secondary to fall. Denies visual problems.  Allergic/Immunologic: Negative for environmental allergies.  Neurological: Positive for speech difficulty, weakness and numbness. Negative for dizziness, facial asymmetry and headaches.       History of CVA with residual Left sided paralysis and dysphagia.    Filed Vitals:   12/06/15 1440  BP: 130/74  Pulse: 80  Temp: 98 F (36.7 C)  Resp: 20  Height: 5\' 8"  (1.727 m)  Weight: 166 lb (75.297 kg)   Body mass index is 25.25 kg/(m^2). Physical Exam  Constitutional: She is oriented to person, place, and time. She appears well-developed and well-nourished.  HENT:  Head: Normocephalic and atraumatic.  Right Ear: External ear normal.  Left Ear: External ear normal.  Nose: Nose normal.    Mouth/Throat: Oropharynx is clear and moist.  Eyes: Conjunctivae and EOM are normal. Pupils are equal, round, and reactive to light. Right eye exhibits no discharge. Left eye exhibits no discharge.  Neck: Normal range of motion. Neck supple. No JVD present. No tracheal deviation present. No thyromegaly present.  Cardiovascular: Normal rate, normal heart sounds and intact distal pulses.   No murmur heard. Pulmonary/Chest: No stridor. No respiratory distress. She has no wheezes. She has no rales. She exhibits no tenderness.  Abdominal: She exhibits no distension. There is no tenderness. There is no rebound and no guarding.  Musculoskeletal: She exhibits no edema or tenderness.  History of fall that causing periorbital hematoma  Neurological: She is alert and oriented to person, place, and time. She displays abnormal reflex. No cranial nerve deficit. She exhibits abnormal muscle tone. Coordination abnormal.  Left sided paralysis, muscle strength 3/5. Left foot drop for which she has a brace built into her shoe.  Skin: Skin is warm and dry. No rash noted. No erythema.  Psychiatric: She has a normal mood and affect. Her behavior is normal. Judgment and thought content normal.    Labs reviewed: Basic Metabolic Panel:  Recent Labs  16/10/96 0850 12/01/15 1227 12/01/15 1235 12/03/15 0357  NA 139 141 143 141  K 3.7 4.0 4.1 4.3  CL 106 106 104 105  CO2 23 27  --  25  GLUCOSE 101* 102* 94 95  BUN 11 8 11 12   CREATININE 0.83 0.78 0.70 0.82  CALCIUM 9.1 9.7  --  9.2   Liver Function Tests:  Recent Labs  12/01/15 1634  AST 24  ALT 22  ALKPHOS 77  BILITOT 0.5  PROT 7.0  ALBUMIN 3.4*    Recent Labs  12/01/15 2137  LIPASE 39  AMYLASE 102*   No results for input(s): AMMONIA in the last 8760 hours. CBC:  Recent Labs  04/02/15 0850 12/01/15 1227 12/01/15 1235 12/03/15 0357  WBC 4.8 4.0  --  5.2  HGB 13.4 13.8 15.6* 12.0  HCT 42.2 44.3 46.0 37.1  MCV 89.0 87.0  --  87.1   PLT 154 193  --  192   Cardiac Enzymes:  Recent Labs  12/01/15 1634 12/01/15 2137 12/02/15 0318  TROPONINI <0.03 0.05* <0.03   BNP: Invalid input(s): POCBNP No results found for: HGBA1C No results found for: TSH No results found for: VITAMINB12 No results found for: FOLATE No results found for: IRON, TIBC, FERRITIN  Imaging and Procedures obtained prior to SNF admission: Ct Head Wo Contrast  12/01/2015  CLINICAL DATA:  Bilateral orbital hematomas after fall. No loss of consciousness. EXAM: CT HEAD WITHOUT CONTRAST TECHNIQUE: Contiguous axial images were obtained from the base of the skull through the vertex without intravenous contrast. COMPARISON:  CT scan of September 29, 2013. FINDINGS: Bony calvarium appears intact. Encephalomalacia is again noted in the right MCA territory consistent with old infarction. No mass effect or midline shift is noted. Ventricular size is within normal limits. There is no evidence of mass lesion, hemorrhage or acute infarction. IMPRESSION: Old right MCA infarction.  No acute intracranial abnormality seen. Electronically Signed   By: Lupita Raider, M.D.   On: 12/01/2015 14:14   Nm Myocar Multi W/spect W/wall Motion / Ef  12/02/2015  CLINICAL DATA:  CAD, prior MI, status post 3 vessel CABG EXAM: MYOCARDIAL IMAGING WITH SPECT (REST AND PHARMACOLOGIC-STRESS) GATED LEFT VENTRICULAR WALL MOTION STUDY LEFT VENTRICULAR EJECTION FRACTION TECHNIQUE: Standard myocardial SPECT imaging was performed after resting intravenous injection of 10 mCi Tc-42m sestamibi. Subsequently, intravenous infusion of Lexiscan was performed under the supervision of the Cardiology staff. At peak effect of the drug, 30 mCi Tc-47m sestamibi was injected intravenously and standard myocardial SPECT imaging was performed. Quantitative gated imaging was also performed to evaluate left ventricular wall motion, and estimate left ventricular ejection fraction. COMPARISON:  None. FINDINGS: Perfusion:  Moderate sized fixed defect, severe in the apex and moderate in the inferolateral wall, without reversibility. No findings to suggest reversible ischemia. Wall Motion: Mild global hypokinesia. Severe left ventricular dilatation. Left Ventricular Ejection Fraction: 33 % End diastolic volume 142 ml End systolic volume 95 ml IMPRESSION: 1. Moderate fixed defect involving the apex and inferolateral wall, compatible with the clinical history of prior infarct. No findings to suggest reversible ischemia. 2. Mild global hypokinesia with severe left ventricular dilatation. 3. Left ventricular ejection fraction 33%. 4. High-risk stress test findings*, on the basis of low ejection fraction. *2012 Appropriate Use Criteria for Coronary Revascularization Focused Update: J Am Coll Cardiol. 2012;59(9):857-881. http://content.dementiazones.com.aspx?articleid=1201161 Electronically Signed   By: Charline Bills M.D.   On: 12/02/2015 16:55   Dg Chest Portable 1 View  12/01/2015  CLINICAL DATA:  RIGHT-sided chest pain and arm pain for unknown period of time, coronary artery disease, hypertension, prior stroke EXAM: PORTABLE CHEST 1 VIEW COMPARISON:  Portable exam 1216 hours compared to 04/02/2015 FINDINGS: Upper normal heart size post median sternotomy. Mediastinal contours and pulmonary vascularity normal. Lungs clear. No pleural effusion or pneumothorax. Bones unremarkable. IMPRESSION: No acute abnormalities. Electronically Signed   By: Ulyses Southward M.D.   On: 12/01/2015 12:50   Ct Angio Chest Aorta W/cm &/or Wo/cm  12/01/2015  CLINICAL DATA:  Larey Seat Monday, bilateral orbital hematomas, abrasion to left zygoma. Patient describes right axillary pain extending into back. Patient on blood thinners. EXAM: CT ANGIOGRAPHY CHEST, ABDOMEN AND PELVIS TECHNIQUE: Multidetector CT imaging through the chest, abdomen and pelvis was performed using the standard protocol during bolus administration of intravenous contrast. Multiplanar  reconstructed images and MIPs were obtained and reviewed to evaluate the vascular anatomy. CONTRAST:  100 cc Isovue 370 COMPARISON:  None. FINDINGS:  CTA CHEST FINDINGS Thoracic aorta appears intact and normal in configuration. No aneurysm or dissection. Scattered atherosclerotic changes seen along the walls of the thoracic aorta. No pulmonary embolism identified. Heart size is upper normal. Patient is status post median sternotomy for presumed CABG. Diffuse coronary artery calcifications are noted. No pericardial effusion. No pulmonary infiltrate, mass or effusion. No pneumothorax. Trachea and central bronchi are unremarkable. No masses or enlarged lymph nodes seen within the mediastinum or perihilar regions. No acute or suspicious osseous lesion. No rib fracture or displacement. Mild degenerative spurring noted throughout the scoliotic thoracic spine. Review of the MIP images confirms the above findings. CTA ABDOMEN AND PELVIS FINDINGS Atherosclerotic changes are seen along the walls of the normal-caliber abdominal aorta, at the origins of the aortic branch vessels and throughout the pelvic vasculature. No arterial occlusion seen. No aneurysm or dissection. Liver, gallbladder, spleen, pancreas and adrenal glands appear normal. Areas of scarring noted within each renal cortex. No renal stone or hydronephrosis. No ureteral or bladder calculi identified. There is common bile duct dilatation measuring 12 mm diameter. No bile duct stone seen. No significant intrahepatic bile duct dilatation. Bowel is normal in caliber. No bowel wall thickening or evidence of bowel wall inflammation seen. Moderate amount of stool and gas throughout the colon. Appendix is not seen but there are no inflammatory changes about the cecum to suggest acute appendicitis. Small free fluid in the lower pelvis. No other free fluid seen. No abscess collection. No free intraperitoneal air. There are 4 lumbar type vertebral bodies. Based on this  numbering, there is a compression fracture deformity of the L1 vertebral body which is of uncertain age but favored to be chronic. No acute-appearing osseous abnormality seen. Review of the MIP images confirms the above findings. IMPRESSION: 1. No acute- appearing abnormality of the thoracic or abdominal aorta. No aortic aneurysm or dissection. No evidence of traumatic aortic injury. 2. Overall, no evidence of acute intrathoracic abnormality. Chronic findings detailed above. 3. Common bile duct dilatation measuring up to 12 mm diameter. No obstructing bile duct stone identified. Recommend correlation with liver function tests. If abnormal, would consider MRCP for further characterization. 4. Compression fracture deformity of the L1 vertebral body, approximately 20% compressed anteriorly, of uncertain age but favored to be chronic. 5. Additional chronic/incidental findings detailed above. Electronically Signed   By: Bary Richard M.D.   On: 12/01/2015 14:34   Ct Cta Abd/pel W/cm &/or W/o Cm  12/01/2015  CLINICAL DATA:  Larey Seat Monday, bilateral orbital hematomas, abrasion to left zygoma. Patient describes right axillary pain extending into back. Patient on blood thinners. EXAM: CT ANGIOGRAPHY CHEST, ABDOMEN AND PELVIS TECHNIQUE: Multidetector CT imaging through the chest, abdomen and pelvis was performed using the standard protocol during bolus administration of intravenous contrast. Multiplanar reconstructed images and MIPs were obtained and reviewed to evaluate the vascular anatomy. CONTRAST:  100 cc Isovue 370 COMPARISON:  None. FINDINGS: CTA CHEST FINDINGS Thoracic aorta appears intact and normal in configuration. No aneurysm or dissection. Scattered atherosclerotic changes seen along the walls of the thoracic aorta. No pulmonary embolism identified. Heart size is upper normal. Patient is status post median sternotomy for presumed CABG. Diffuse coronary artery calcifications are noted. No pericardial effusion. No  pulmonary infiltrate, mass or effusion. No pneumothorax. Trachea and central bronchi are unremarkable. No masses or enlarged lymph nodes seen within the mediastinum or perihilar regions. No acute or suspicious osseous lesion. No rib fracture or displacement. Mild degenerative spurring noted throughout the scoliotic thoracic  spine. Review of the MIP images confirms the above findings. CTA ABDOMEN AND PELVIS FINDINGS Atherosclerotic changes are seen along the walls of the normal-caliber abdominal aorta, at the origins of the aortic branch vessels and throughout the pelvic vasculature. No arterial occlusion seen. No aneurysm or dissection. Liver, gallbladder, spleen, pancreas and adrenal glands appear normal. Areas of scarring noted within each renal cortex. No renal stone or hydronephrosis. No ureteral or bladder calculi identified. There is common bile duct dilatation measuring 12 mm diameter. No bile duct stone seen. No significant intrahepatic bile duct dilatation. Bowel is normal in caliber. No bowel wall thickening or evidence of bowel wall inflammation seen. Moderate amount of stool and gas throughout the colon. Appendix is not seen but there are no inflammatory changes about the cecum to suggest acute appendicitis. Small free fluid in the lower pelvis. No other free fluid seen. No abscess collection. No free intraperitoneal air. There are 4 lumbar type vertebral bodies. Based on this numbering, there is a compression fracture deformity of the L1 vertebral body which is of uncertain age but favored to be chronic. No acute-appearing osseous abnormality seen. Review of the MIP images confirms the above findings. IMPRESSION: 1. No acute- appearing abnormality of the thoracic or abdominal aorta. No aortic aneurysm or dissection. No evidence of traumatic aortic injury. 2. Overall, no evidence of acute intrathoracic abnormality. Chronic findings detailed above. 3. Common bile duct dilatation measuring up to 12 mm  diameter. No obstructing bile duct stone identified. Recommend correlation with liver function tests. If abnormal, would consider MRCP for further characterization. 4. Compression fracture deformity of the L1 vertebral body, approximately 20% compressed anteriorly, of uncertain age but favored to be chronic. 5. Additional chronic/incidental findings detailed above. Electronically Signed   By: Bary RichardStan  Maynard M.D.   On: 12/01/2015 14:34   [place] Abdomen Limited Ruq  12/01/2015  CLINICAL DATA:  Abdominal pain for 1 day. EXAM: US ABDOMEN LIMITED - RIGHT UPPER QUADRANT COMPARISON:  CT abdomen from earlier same day. FINDINGS: Gallbladder: No gallstones seen. There is no gallbladder wall thickening, pericholecystic fluid or other secondary sonographic signs of acute cholecystitis. No sonographic Murphy's sign elicited during the examination, per the sonographer. Common bile duct: Diameter: At the level of the porta hepatis, the common bile duct is upper normal at 8 mm diameter. There is more significant dilatation of the distal common bile duct measuring up to 11 mm diameter, compatible with the 12 mm measurement on earlier CT. No bile duct stone identified. No significant intrahepatic bile duct dilatation identified. Liver: No focal lesion identified. Within normal limits in parenchymal echogenicity. IMPRESSION: 1. Common bile duct dilatation, measuring up to 11 mm diameter, of uncertain etiology. No obstructing bile duct stone or mass identified on this exam or on the earlier CT. On the earlier CT, there appears to be appropriate tapering of the distal-most common bile duct. Consider correlation with liver function tests and, if abnormal, MRCP. 2. Gallbladder appears normal. No gallstones seen. No evidence of cholecystitis. Electronically Signed   By: Bary RichardStan  Maynard M.D.   On: 12/01/2015 18:46    Assessment/Plan  1. Ischemic chest pain (HCC) Resolved. Use nitroglycerin as needed for recurrences.  2. Essential  hypertension Controlled  3. Seizure disorder (HCC) Remains on lamotrigine. Has not had any seizures recently.  4. Urinary incontinence, unspecified incontinence type Recent onset. Patient thought she was on a diuretic, but she has not. We will observe this and start oxybutynin if the problem persists. Urinalysis and  culture as requested today  5. Periorbital hematoma of left eye Resolving  6. Gait disturbance Engaged in strengthening and gait training in hopes that fall risk will improve  7. Foot drop, left Brace is built into her shoe and she should wear this when up and about  8. Seizures (HCC) Continue Lamictal  9. Fall at home, initial encounter Engaged in physical therapy for strengthening and gait training. Goal is for her to get stronger, improved on safe mobility, and improved self-care skills.

## 2015-12-09 LAB — CBC AND DIFFERENTIAL
HEMATOCRIT: 40 % (ref 36–46)
HEMOGLOBIN: 12.2 g/dL (ref 12.0–16.0)
Platelets: 207 10*3/uL (ref 150–399)
WBC: 4.3 10*3/mL

## 2015-12-09 LAB — BASIC METABOLIC PANEL
BUN: 14 mg/dL (ref 4–21)
Creatinine: 1 mg/dL (ref 0.5–1.1)
Glucose: 94 mg/dL
POTASSIUM: 4.3 mmol/L (ref 3.4–5.3)
Sodium: 141 mmol/L (ref 137–147)

## 2015-12-09 LAB — TSH: TSH: 1.98 u[IU]/mL (ref 0.41–5.90)

## 2015-12-18 ENCOUNTER — Non-Acute Institutional Stay (SKILLED_NURSING_FACILITY): Payer: Medicare PPO | Admitting: Nurse Practitioner

## 2015-12-18 ENCOUNTER — Other Ambulatory Visit: Payer: Self-pay | Admitting: Nurse Practitioner

## 2015-12-18 ENCOUNTER — Encounter: Payer: Self-pay | Admitting: Nurse Practitioner

## 2015-12-18 DIAGNOSIS — K5900 Constipation, unspecified: Secondary | ICD-10-CM

## 2015-12-18 DIAGNOSIS — G40909 Epilepsy, unspecified, not intractable, without status epilepticus: Secondary | ICD-10-CM

## 2015-12-18 DIAGNOSIS — R269 Unspecified abnormalities of gait and mobility: Secondary | ICD-10-CM

## 2015-12-18 DIAGNOSIS — I209 Angina pectoris, unspecified: Secondary | ICD-10-CM

## 2015-12-18 DIAGNOSIS — I69359 Hemiplegia and hemiparesis following cerebral infarction affecting unspecified side: Secondary | ICD-10-CM | POA: Diagnosis not present

## 2015-12-18 DIAGNOSIS — S32010S Wedge compression fracture of first lumbar vertebra, sequela: Secondary | ICD-10-CM

## 2015-12-18 DIAGNOSIS — I69398 Other sequelae of cerebral infarction: Secondary | ICD-10-CM

## 2015-12-18 DIAGNOSIS — K838 Other specified diseases of biliary tract: Secondary | ICD-10-CM | POA: Diagnosis not present

## 2015-12-18 DIAGNOSIS — I10 Essential (primary) hypertension: Secondary | ICD-10-CM | POA: Diagnosis not present

## 2015-12-18 MED ORDER — ATORVASTATIN CALCIUM 20 MG PO TABS: 20.0000 mg | ORAL_TABLET | Freq: Every evening | ORAL | Status: DC

## 2015-12-18 MED ORDER — LISINOPRIL 20 MG PO TABS: 20.0000 mg | ORAL_TABLET | Freq: Every day | ORAL | Status: DC

## 2015-12-18 MED ORDER — VITAMIN D 1000 UNITS PO TABS
1000.0000 [IU] | ORAL_TABLET | Freq: Every day | ORAL | Status: DC
Start: 2015-12-18 — End: 2018-10-08

## 2015-12-18 MED ORDER — NITROGLYCERIN 0.4 MG SL SUBL: 0.4000 mg | SUBLINGUAL_TABLET | SUBLINGUAL | Status: DC | PRN

## 2015-12-18 MED ORDER — CLOPIDOGREL BISULFATE 75 MG PO TABS: 75.0000 mg | ORAL_TABLET | Freq: Every day | ORAL | Status: DC

## 2015-12-18 MED ORDER — LAMOTRIGINE 100 MG PO TABS: 100.0000 mg | ORAL_TABLET | Freq: Two times a day (BID) | ORAL | Status: DC

## 2015-12-18 MED ORDER — METOPROLOL SUCCINATE ER 50 MG PO TB24: 50.0000 mg | ORAL_TABLET | Freq: Every day | ORAL | Status: DC

## 2015-12-18 MED ORDER — ACETAMINOPHEN 500 MG PO TABS: 500.0000 mg | ORAL_TABLET | Freq: Three times a day (TID) | ORAL | Status: AC | PRN

## 2015-12-23 NOTE — Assessment & Plan Note (Signed)
Stable, continue MiraLax daily.  

## 2015-12-23 NOTE — Assessment & Plan Note (Signed)
-   CVA 07/2009 left sided weakness  -Last evaluation at neurology office was on 3/17 - Continue aspirin, Plavix, statin

## 2015-12-23 NOTE — Assessment & Plan Note (Signed)
-  Last evaluation at neurology office was on 3/17 - Continue aspirin 81mg , Plavix 75mg , Atorvastatin 20mg  daily - w/c for mobility

## 2015-12-23 NOTE — Assessment & Plan Note (Signed)
Controlled, continue Lisinopril 20mg  and Metoprolol 50mg  daily.  12/09/15 Na 141, K 4.3, Bun 14, creat 0.97

## 2015-12-23 NOTE — Assessment & Plan Note (Signed)
No intrahepatic dilatation and no evidence of cholelithiasis on CT -No abdominal pain or GI symptoms, amylase minimally elevated may have passed a stone with CBD dilatation. No choledocholithiasis or cholelithiasis on ultrasound.

## 2015-12-23 NOTE — Assessment & Plan Note (Signed)
Compression ssion fracture of L1 lumbar vertebra  -No reports of chronic back pain

## 2015-12-23 NOTE — Assessment & Plan Note (Signed)
No seizure activities while in SNF, continue Lamictal 100mg  bid, continue to monitor for seizure activity.

## 2015-12-23 NOTE — Assessment & Plan Note (Signed)
Had both some typical and atypical features , In the setting of CAD status post CABG in 1997  -Serial troponin showed 1 elevated troponin 0.05, hence Cardiology was consulted - Patient underwent stress test which showed moderate fixed defect involving the apex, inferolateral wall, prior infarct, no findings to suggest reversible ischemia, mild global hypokinesia with severe left ventricular dilatation, EF 33%.  - 2D- ECHO EF showed EF of 60-65% with grade 1 diastolic dysfunction - patient will need outpatient follow-up with cardiology. Message sent to Trish.  -Continue aspirin, Plavix and beta blocker as well as statin, prn NTG. No chest pain occurrence while in SNF

## 2015-12-23 NOTE — Progress Notes (Signed)
Patient ID: [name] [name], female   DOB: 1941-04-27, 75 y.o.   MRN: [id number]  Location:  [facility] Room Number: 411 A Place of Service:  SNF (31)  Provider: Chipper Oman NP  PCP: [name] Rubenstein, MD Patient Care Team: [name] James, MD as PCP - General (Family Medicine) [name] Shiver, MD as Consulting Physician (Gastroenterology)  Extended Emergency Contact Information Primary Emergency Contact: [name],[name] Address: [address]          [address] [place], [place] [address] [place] Home Phone: [phone] Mobile Phone: [phone] Relation: Daughter  Code Status: DNR Goals of care:  Advanced Directive information Advanced Directives 12/18/2015  Does patient have an advance directive? No  Type of Advance Directive -  Does patient want to make changes to advanced directive? -  Copy of advanced directive(s) in chart? -  Would patient like information on creating an advanced directive? -     Allergies  Allergen Reactions  . Latex Rash  . Penicillins Rash    Has patient had a PCN reaction causing immediate rash, facial/tongue/throat swelling, SOB or lightheadedness with hypotension: No Has patient had a PCN reaction causing severe rash involving mucus membranes or skin necrosis: No Has patient had a PCN reaction that required hospitalization No Has patient had a PCN reaction occurring within the last 10 years: No If all of the above answers are "NO", then may proceed with Cephalosporin use.  . Tape Rash    Chief Complaint  Patient presents with  . Discharge Note    Patient will be leaving [facility] today 12/18/15    HPI:  75 y.o. female  Hx of late effect of CVA, left sided paralysis, taking ASA 81mg , Atorvastatin 20mg , and Plavix 75mg  daily. Her CBC, CMP, TSH unremarkable 12/09/15. Blood pressure is controlled, taking Lisinopril 20mg  and Metoprolol 50mg  daily. Regular BM while on daily MiraLax. No seizure activity since last  visited, taking Lamictal 100mg  bid.     Past Medical History  Diagnosis Date  . CAD (coronary artery disease) 1997     with CABG  . Stroke (HCC)   . Hypertension   . Hypercholesterolemia   . Peroneal DVT (deep venous thrombosis) (HCC)   . Osteoporosis   . Seizures (HCC)   . Post-menopausal   . Heart attack (HCC)   . Peripheral vascular disease (HCC)     Left superficial femoral artery  . Hemiparesis and alteration of sensations as late effects of stroke (HCC) 03/14/2013    left  . Headache(784.0) 03/14/2013  . Cervical spine fracture [facility])     Not requiring surgery  . Palpitations   . Foot drop, left   . Gait disturbance     quad cane  . Periorbital hematoma of left eye   . Urinary incontinence   . Constipation     Past Surgical History  Procedure Laterality Date  . Cataract extraction    . Coronary artery bypass graft      x3      reports that she has never smoked. She has never used smokeless tobacco. She reports that she does not drink alcohol or use illicit drugs. Social History   Social History  . Marital Status: Widowed    Spouse Name: N/A  . Number of Children: 5  . Years of Education: 10   Occupational History  . Retired    Social History Main Topics  .  Smoking status: Never Smoker   . Smokeless tobacco: Never Used  . Alcohol Use: No  . Drug Use: No  . Sexual Activity: Not on file   Other Topics Concern  . Not on file   Social History Narrative   Patient is a widowed and lives with her daughter [name] [name]).   Patient has three living children and two are deceased.   Patient is retired.   Patient has a 10th grade education.   Patient is right-handed.   Patient occasionally drinks caffeine.         Functional Status Survey:    Allergies  Allergen Reactions  . Latex Rash  . Penicillins Rash    Has patient had a PCN reaction causing immediate rash, facial/tongue/throat swelling, SOB or lightheadedness with hypotension: No Has patient had a  PCN reaction causing severe rash involving mucus membranes or skin necrosis: No Has patient had a PCN reaction that required hospitalization No Has patient had a PCN reaction occurring within the last 10 years: No If all of the above answers are "NO", then may proceed with Cephalosporin use.  . Tape Rash    Pertinent  Health Maintenance Due  Topic Date Due  . MAMMOGRAM  03/30/1993  . DEXA SCAN  03/30/2008  . PNA vac Low Risk Adult (1 of 2 - PCV13) 03/30/2008  . INFLUENZA VACCINE  03/31/2016  . COLONOSCOPY  06/27/2023    Medications:   Medication List       This list is accurate as of: 12/18/15 11:59 PM.  Always use your most recent med list.               acetaminophen 500 MG tablet  Commonly known as:  TYLENOL  Take 1-2 tablets (500-1,000 mg total) by mouth every 8 (eight) hours as needed for mild pain.     aspirin EC 81 MG tablet  Take 1 tablet (81 mg total) by mouth daily.     atorvastatin 20 MG tablet  Commonly known as:  LIPITOR  Take 1 tablet (20 mg total) by mouth every evening.     cholecalciferol 1000 units tablet  Commonly known as:  VITAMIN D  Take 1 tablet (1,000 Units total) by mouth daily.     clopidogrel 75 MG tablet  Commonly known as:  PLAVIX  Take 1 tablet (75 mg total) by mouth daily.     lamoTRIgine 100 MG tablet  Commonly known as:  LAMICTAL  Take 1 tablet (100 mg total) by mouth 2 (two) times daily.     lisinopril 20 MG tablet  Commonly known as:  PRINIVIL,ZESTRIL  Take 1 tablet (20 mg total) by mouth at bedtime.     metoprolol succinate 50 MG 24 hr tablet  Commonly known as:  TOPROL-XL  TAKE 1 TABLET BY MOUTH DAILY.TAKE WITH OR IMMEDIATELY FOLOWING A MEAL     nitroGLYCERIN 0.4 MG SL tablet  Commonly known as:  NITROSTAT  DISSOLVE 1 TABLET UNDER THE TONGUE EVERY 5 MINUTES AS NEEDED FOR CHEST PAIN OR SHORTNESS OF BREATH     polyethylene glycol packet  Commonly known as:  MIRALAX / GLYCOLAX  Take 17 g by mouth. Take with 4 oz liquid  daily, hold for diarrhea        Review of Systems  Constitutional: Positive for activity change. Negative for fever, chills, diaphoresis, appetite change, fatigue and unexpected weight change.  HENT: Negative for congestion, dental problem, drooling, ear discharge, ear pain, facial swelling, hearing loss and mouth  sores.   Eyes: Negative for photophobia, pain, discharge, redness and itching.  Respiratory: Negative for apnea, cough, choking, chest tightness, shortness of breath, wheezing and stridor.   Cardiovascular: Negative for chest pain, palpitations and leg swelling.  Gastrointestinal: Positive for constipation. Negative for nausea, abdominal pain, diarrhea, blood in stool, abdominal distention, anal bleeding and rectal pain.  Endocrine: Negative for cold intolerance, heat intolerance, polydipsia, polyphagia and polyuria.  Genitourinary: Positive for frequency. Negative for dysuria, flank pain, difficulty urinating and dyspareunia.  Musculoskeletal: Negative for myalgias, back pain, joint swelling, arthralgias, gait problem, neck pain and neck stiffness.  Skin: Negative for color change, pallor and rash.  Allergic/Immunologic: Negative for environmental allergies.  Neurological: Positive for speech difficulty, weakness and numbness. Negative for dizziness, facial asymmetry and headaches.       Left sided paralysis,     Filed Vitals:   12/18/15 1543  BP: 126/70  Pulse: 72  Temp: 97.8 F (36.6 C)  TempSrc: Oral  Resp: 18  Height: 5\' 9"  (1.753 m)  Weight: 166 lb (75.297 kg)   Body mass index is 24.5 kg/(m^2). Physical Exam  Constitutional: She is oriented to person, place, and time. She appears well-developed and well-nourished.  HENT:  Head: Normocephalic and atraumatic.  Right Ear: External ear normal.  Left Ear: External ear normal.  Nose: Nose normal.  Mouth/Throat: Oropharynx is clear and moist.  Eyes: Conjunctivae and EOM are normal. Pupils are equal, round, and  reactive to light. Right eye exhibits no discharge. Left eye exhibits no discharge.  Neck: Normal range of motion. Neck supple. No JVD present. No tracheal deviation present. No thyromegaly present.  Cardiovascular: Normal rate, normal heart sounds and intact distal pulses.   No murmur heard. Pulmonary/Chest: No stridor. No respiratory distress. She has no wheezes. She has no rales. She exhibits no tenderness.  Abdominal: She exhibits no distension. There is no tenderness. There is no rebound and no guarding.  Musculoskeletal: She exhibits no edema or tenderness.  Neurological: She is alert and oriented to person, place, and time. She displays abnormal reflex. No cranial nerve deficit. She exhibits abnormal muscle tone. Coordination abnormal.  Left sided paralysis, muscle strength 3/5  Skin: Skin is warm and dry. No rash noted. No erythema.  Psychiatric: She has a normal mood and affect. Her behavior is normal. Judgment and thought content normal.    Labs reviewed: Basic Metabolic Panel:  Recent Labs  16/10/96 0850 12/01/15 1227 12/01/15 1235 12/03/15 0357 12/09/15  NA 139 141 143 141 141  K 3.7 4.0 4.1 4.3 4.3  CL 106 106 104 105  --   CO2 23 27  --  25  --   GLUCOSE 101* 102* 94 95  --   BUN 11 8 11 12 14   CREATININE 0.83 0.78 0.70 0.82 1.0  CALCIUM 9.1 9.7  --  9.2  --    Liver Function Tests:  Recent Labs  12/01/15 1634  AST 24  ALT 22  ALKPHOS 77  BILITOT 0.5  PROT 7.0  ALBUMIN 3.4*    Recent Labs  12/01/15 2137  LIPASE 39  AMYLASE 102*   No results for input(s): AMMONIA in the last 8760 hours. CBC:  Recent Labs  04/02/15 0850 12/01/15 1227 12/01/15 1235 12/03/15 0357 12/09/15  WBC 4.8 4.0  --  5.2 4.3  HGB 13.4 13.8 15.6* 12.0 12.2  HCT 42.2 44.3 46.0 37.1 40  MCV 89.0 87.0  --  87.1  --   PLT 154 193  --  192 207   Cardiac Enzymes:  Recent Labs  12/01/15 1634 12/01/15 2137 12/02/15 0318  TROPONINI <0.03 0.05* <0.03   BNP: Invalid  input(s): POCBNP CBG:  Recent Labs  04/02/15 0829  GLUCAP 90    Procedures and Imaging Studies During Stay: Ct Head Wo Contrast  12/01/2015  CLINICAL DATA:  Bilateral orbital hematomas after fall. No loss of consciousness. EXAM: CT HEAD WITHOUT CONTRAST TECHNIQUE: Contiguous axial images were obtained from the base of the skull through the vertex without intravenous contrast. COMPARISON:  CT scan of September 29, 2013. FINDINGS: Bony calvarium appears intact. Encephalomalacia is again noted in the right MCA territory consistent with old infarction. No mass effect or midline shift is noted. Ventricular size is within normal limits. There is no evidence of mass lesion, hemorrhage or acute infarction. IMPRESSION: Old right MCA infarction.  No acute intracranial abnormality seen. Electronically Signed   By: Lupita Raider, M.D.   On: 12/01/2015 14:14   Nm Myocar Multi W/spect W/wall Motion / Ef  12/02/2015  CLINICAL DATA:  CAD, prior MI, status post 3 vessel CABG EXAM: MYOCARDIAL IMAGING WITH SPECT (REST AND PHARMACOLOGIC-STRESS) GATED LEFT VENTRICULAR WALL MOTION STUDY LEFT VENTRICULAR EJECTION FRACTION TECHNIQUE: Standard myocardial SPECT imaging was performed after resting intravenous injection of 10 mCi Tc-72m sestamibi. Subsequently, intravenous infusion of Lexiscan was performed under the supervision of the Cardiology staff. At peak effect of the drug, 30 mCi Tc-73m sestamibi was injected intravenously and standard myocardial SPECT imaging was performed. Quantitative gated imaging was also performed to evaluate left ventricular wall motion, and estimate left ventricular ejection fraction. COMPARISON:  None. FINDINGS: Perfusion: Moderate sized fixed defect, severe in the apex and moderate in the inferolateral wall, without reversibility. No findings to suggest reversible ischemia. Wall Motion: Mild global hypokinesia. Severe left ventricular dilatation. Left Ventricular Ejection Fraction: 33 % End  diastolic volume 142 ml End systolic volume 95 ml IMPRESSION: 1. Moderate fixed defect involving the apex and inferolateral wall, compatible with the clinical history of prior infarct. No findings to suggest reversible ischemia. 2. Mild global hypokinesia with severe left ventricular dilatation. 3. Left ventricular ejection fraction 33%. 4. High-risk stress test findings*, on the basis of low ejection fraction. *2012 Appropriate Use Criteria for Coronary Revascularization Focused Update: J Am Coll Cardiol. 2012;59(9):857-881. http://content.dementiazones.com.aspx?articleid=[email or web address] Electronically Signed   By: Charline Bills M.D.   On: 12/02/2015 16:55   Dg Chest Portable 1 View  12/01/2015  CLINICAL DATA:  RIGHT-sided chest pain and arm pain for unknown period of time, coronary artery disease, hypertension, prior stroke EXAM: PORTABLE CHEST 1 VIEW COMPARISON:  Portable exam 1216 hours compared to 04/02/2015 FINDINGS: Upper normal heart size post median sternotomy. Mediastinal contours and pulmonary vascularity normal. Lungs clear. No pleural effusion or pneumothorax. Bones unremarkable. IMPRESSION: No acute abnormalities. Electronically Signed   By: Ulyses Southward M.D.   On: 12/01/2015 12:50   Ct Angio Chest Aorta W/cm &/or Wo/cm  12/01/2015  CLINICAL DATA:  Larey Seat Monday, bilateral orbital hematomas, abrasion to left zygoma. Patient describes right axillary pain extending into back. Patient on blood thinners. EXAM: CT ANGIOGRAPHY CHEST, ABDOMEN AND PELVIS TECHNIQUE: Multidetector CT imaging through the chest, abdomen and pelvis was performed using the standard protocol during bolus administration of intravenous contrast. Multiplanar reconstructed images and MIPs were obtained and reviewed to evaluate the vascular anatomy. CONTRAST:  100 cc Isovue 370 COMPARISON:  None. FINDINGS: CTA CHEST FINDINGS Thoracic aorta appears intact and normal in configuration. No aneurysm or dissection. Scattered  atherosclerotic  changes seen along the walls of the thoracic aorta. No pulmonary embolism identified. Heart size is upper normal. Patient is status post median sternotomy for presumed CABG. Diffuse coronary artery calcifications are noted. No pericardial effusion. No pulmonary infiltrate, mass or effusion. No pneumothorax. Trachea and central bronchi are unremarkable. No masses or enlarged lymph nodes seen within the mediastinum or perihilar regions. No acute or suspicious osseous lesion. No rib fracture or displacement. Mild degenerative spurring noted throughout the scoliotic thoracic spine. Review of the MIP images confirms the above findings. CTA ABDOMEN AND PELVIS FINDINGS Atherosclerotic changes are seen along the walls of the normal-caliber abdominal aorta, at the origins of the aortic branch vessels and throughout the pelvic vasculature. No arterial occlusion seen. No aneurysm or dissection. Liver, gallbladder, spleen, pancreas and adrenal glands appear normal. Areas of scarring noted within each renal cortex. No renal stone or hydronephrosis. No ureteral or bladder calculi identified. There is common bile duct dilatation measuring 12 mm diameter. No bile duct stone seen. No significant intrahepatic bile duct dilatation. Bowel is normal in caliber. No bowel wall thickening or evidence of bowel wall inflammation seen. Moderate amount of stool and gas throughout the colon. Appendix is not seen but there are no inflammatory changes about the cecum to suggest acute appendicitis. Small free fluid in the lower pelvis. No other free fluid seen. No abscess collection. No free intraperitoneal air. There are 4 lumbar type vertebral bodies. Based on this numbering, there is a compression fracture deformity of the L1 vertebral body which is of uncertain age but favored to be chronic. No acute-appearing osseous abnormality seen. Review of the MIP images confirms the above findings. IMPRESSION: 1. No acute- appearing abnormality of the  thoracic or abdominal aorta. No aortic aneurysm or dissection. No evidence of traumatic aortic injury. 2. Overall, no evidence of acute intrathoracic abnormality. Chronic findings detailed above. 3. Common bile duct dilatation measuring up to 12 mm diameter. No obstructing bile duct stone identified. Recommend correlation with liver function tests. If abnormal, would consider MRCP for further characterization. 4. Compression fracture deformity of the L1 vertebral body, approximately 20% compressed anteriorly, of uncertain age but favored to be chronic. 5. Additional chronic/incidental findings detailed above. Electronically Signed   By: Bary Richard M.D.   On: 12/01/2015 14:34   Ct Cta Abd/pel W/cm &/or W/o Cm  12/01/2015  CLINICAL DATA:  Larey Seat Monday, bilateral orbital hematomas, abrasion to left zygoma. Patient describes right axillary pain extending into back. Patient on blood thinners. EXAM: CT ANGIOGRAPHY CHEST, ABDOMEN AND PELVIS TECHNIQUE: Multidetector CT imaging through the chest, abdomen and pelvis was performed using the standard protocol during bolus administration of intravenous contrast. Multiplanar reconstructed images and MIPs were obtained and reviewed to evaluate the vascular anatomy. CONTRAST:  100 cc Isovue 370 COMPARISON:  None. FINDINGS: CTA CHEST FINDINGS Thoracic aorta appears intact and normal in configuration. No aneurysm or dissection. Scattered atherosclerotic changes seen along the walls of the thoracic aorta. No pulmonary embolism identified. Heart size is upper normal. Patient is status post median sternotomy for presumed CABG. Diffuse coronary artery calcifications are noted. No pericardial effusion. No pulmonary infiltrate, mass or effusion. No pneumothorax. Trachea and central bronchi are unremarkable. No masses or enlarged lymph nodes seen within the mediastinum or perihilar regions. No acute or suspicious osseous lesion. No rib fracture or displacement. Mild degenerative  spurring noted throughout the scoliotic thoracic spine. Review of the MIP images confirms the above findings. CTA ABDOMEN AND PELVIS FINDINGS  Atherosclerotic changes are seen along the walls of the normal-caliber abdominal aorta, at the origins of the aortic branch vessels and throughout the pelvic vasculature. No arterial occlusion seen. No aneurysm or dissection. Liver, gallbladder, spleen, pancreas and adrenal glands appear normal. Areas of scarring noted within each renal cortex. No renal stone or hydronephrosis. No ureteral or bladder calculi identified. There is common bile duct dilatation measuring 12 mm diameter. No bile duct stone seen. No significant intrahepatic bile duct dilatation. Bowel is normal in caliber. No bowel wall thickening or evidence of bowel wall inflammation seen. Moderate amount of stool and gas throughout the colon. Appendix is not seen but there are no inflammatory changes about the cecum to suggest acute appendicitis. Small free fluid in the lower pelvis. No other free fluid seen. No abscess collection. No free intraperitoneal air. There are 4 lumbar type vertebral bodies. Based on this numbering, there is a compression fracture deformity of the L1 vertebral body which is of uncertain age but favored to be chronic. No acute-appearing osseous abnormality seen. Review of the MIP images confirms the above findings. IMPRESSION: 1. No acute- appearing abnormality of the thoracic or abdominal aorta. No aortic aneurysm or dissection. No evidence of traumatic aortic injury. 2. Overall, no evidence of acute intrathoracic abnormality. Chronic findings detailed above. 3. Common bile duct dilatation measuring up to 12 mm diameter. No obstructing bile duct stone identified. Recommend correlation with liver function tests. If abnormal, would consider MRCP for further characterization. 4. Compression fracture deformity of the L1 vertebral body, approximately 20% compressed anteriorly, of uncertain  age but favored to be chronic. 5. Additional chronic/incidental findings detailed above. Electronically Signed   By: Bary Richard M.D.   On: 12/01/2015 14:34   US Abdomen Limited Ruq  12/01/2015  CLINICAL DATA:  Abdominal pain for 1 day. EXAM: US ABDOMEN LIMITED - RIGHT UPPER QUADRANT COMPARISON:  CT abdomen from earlier same day. FINDINGS: Gallbladder: No gallstones seen. There is no gallbladder wall thickening, pericholecystic fluid or other secondary sonographic signs of acute cholecystitis. No sonographic Murphy's sign elicited during the examination, per the sonographer. Common bile duct: Diameter: At the level of the porta hepatis, the common bile duct is upper normal at 8 mm diameter. There is more significant dilatation of the distal common bile duct measuring up to 11 mm diameter, compatible with the 12 mm measurement on earlier CT. No bile duct stone identified. No significant intrahepatic bile duct dilatation identified. Liver: No focal lesion identified. Within normal limits in parenchymal echogenicity. IMPRESSION: 1. Common bile duct dilatation, measuring up to 11 mm diameter, of uncertain etiology. No obstructing bile duct stone or mass identified on this exam or on the earlier CT. On the earlier CT, there appears to be appropriate tapering of the distal-most common bile duct. Consider correlation with liver function tests and, if abnormal, MRCP. 2. Gallbladder appears normal. No gallstones seen. No evidence of cholecystitis. Electronically Signed   By: Bary Richard M.D.   On: 12/01/2015 18:46    Assessment/Plan:   Abnormality of gait -Last evaluation at neurology office was on 3/17 - Continue aspirin , Plavix , Atorvastatin  daily - w/c for mobility   Chest pain Had both some typical and atypical features , In the setting of CAD status post CABG in 1997  -Serial troponin showed 1 elevated troponin 0.05, hence Cardiology was consulted - Patient underwent stress test which  showed moderate fixed defect involving the apex, inferolateral wall, prior infarct, no findings to  suggest reversible ischemia, mild global hypokinesia with severe left ventricular dilatation, EF 33%.  - 2D- ECHO EF showed EF of 60-65% with grade 1 diastolic dysfunction - patient will need outpatient follow-up with cardiology. Message sent to [name] [name].  -Continue aspirin, Plavix and beta blocker as well as statin, prn NTG. No chest pain occurrence while in SNF  Common bile duct dilatation No intrahepatic dilatation and no evidence of cholelithiasis on CT -No abdominal pain or GI symptoms, amylase minimally elevated may have passed a stone with CBD dilatation. No choledocholithiasis or cholelithiasis on ultrasound.     Compression fracture of L1 lumbar vertebra (HCC) Compression ssion fracture of L1 lumbar vertebra  -No reports of chronic back pain   Constipation Stable, continue MiraLax daily.    Hemiparesis and alteration of sensations as late effects of stroke (HCC) - CVA 07/2009 left sided weakness  -Last evaluation at neurology office was on 3/17 - Continue aspirin, Plavix, statin   HTN (hypertension) Controlled, continue Lisinopril 20mg  and Metoprolol 50mg  daily.  12/09/15 Na 141, K 4.3, Bun 14, creat 0.97  Seizure disorder (HCC) No seizure activities while in SNF, continue Lamictal 100mg  bid, continue to monitor for seizure activity.     Patient is being discharged with the following home health services:    Patient is being discharged with the following durable medical equipment:    Patient has been advised to f/u with their PCP in 1-2 weeks to bring them up to date on their rehab stay.  Social services at facility was responsible for arranging this appointment.  Pt was provided with a 30 day supply of prescriptions for medications and refills must be obtained from their PCP.  For controlled substances, a more limited supply may be provided adequate until PCP appointment  only.  Future labs/tests needed:  None

## 2016-01-06 DIAGNOSIS — I25118 Atherosclerotic heart disease of native coronary artery with other forms of angina pectoris: Secondary | ICD-10-CM | POA: Diagnosis not present

## 2016-01-06 DIAGNOSIS — S32000A Wedge compression fracture of unspecified lumbar vertebra, initial encounter for closed fracture: Secondary | ICD-10-CM | POA: Diagnosis not present

## 2016-01-06 DIAGNOSIS — M810 Age-related osteoporosis without current pathological fracture: Secondary | ICD-10-CM | POA: Diagnosis not present

## 2016-01-06 DIAGNOSIS — R079 Chest pain, unspecified: Secondary | ICD-10-CM | POA: Diagnosis not present

## 2016-01-06 DIAGNOSIS — I10 Essential (primary) hypertension: Secondary | ICD-10-CM | POA: Diagnosis not present

## 2016-01-06 DIAGNOSIS — I69959 Hemiplegia and hemiparesis following unspecified cerebrovascular disease affecting unspecified side: Secondary | ICD-10-CM | POA: Diagnosis not present

## 2016-01-15 ENCOUNTER — Telehealth: Payer: Self-pay | Admitting: Cardiovascular Disease

## 2016-01-15 NOTE — Telephone Encounter (Signed)
Received records from [facility] for appointment on 01/21/16 with Dr Tresa EndoKelly.  [facility] for Dr Landry DykeKelly's schedule on 01/21/16. lp

## 2016-01-20 ENCOUNTER — Ambulatory Visit: Payer: Medicare PPO | Admitting: Cardiovascular Disease

## 2016-01-21 ENCOUNTER — Ambulatory Visit: Payer: Medicare PPO | Admitting: Cardiovascular Disease

## 2016-01-28 ENCOUNTER — Other Ambulatory Visit: Payer: Self-pay | Admitting: Neurology

## 2016-02-13 ENCOUNTER — Telehealth: Payer: Self-pay | Admitting: Cardiology

## 2016-02-13 NOTE — Telephone Encounter (Signed)
Received records from [facility] for appointment on 03/04/16 with Dr Jens Somrenshaw.  [facility] Dr Ludwig Clarksrenshaw's schedule on 03/04/16. lp

## 2016-02-26 NOTE — Progress Notes (Signed)
HPI: 75 year old female for evaluation of chest pain and coronary artery disease. Patient is status post coronary artery bypass and graft in 1997 and has also had a prior CVA. She was recently admitted in April 2017 with chest pain. Note she had fallen prior to admission with some trauma to her chest. CTA showed no dissection. Ultrasound showed no gallstones but there was dilatation of common bile duct. Nuclear study showed ejection fraction 33%. There was prior infarct involving the apex and inferior lateral wall but no ischemia. Echocardiogram showed ejection fraction 60-65% and grade 1 diastolic dysfunction. Since DC,   Current Outpatient Prescriptions  Medication Sig Dispense Refill  . acetaminophen (TYLENOL) 500 MG tablet Take 1-2 tablets (500-1,000 mg total) by mouth every 8 (eight) hours as needed for mild pain. 90 tablet 0  . aspirin EC 81 MG tablet Take 1 tablet (81 mg total) by mouth daily.    Marland Kitchen. atorvastatin (LIPITOR) 20 MG tablet Take 1 tablet (20 mg total) by mouth every evening. 30 tablet 0  . cholecalciferol (VITAMIN D) 1000 units tablet Take 1 tablet (1,000 Units total) by mouth daily. 30 tablet 0  . clopidogrel (PLAVIX) 75 MG tablet Take 1 tablet (75 mg total) by mouth daily. 30 tablet 0  . lamoTRIgine (LAMICTAL) 100 MG tablet Take 1 tablet (100 mg total) by mouth 2 (two) times daily. 180 tablet 0  . lamoTRIgine (LAMICTAL) 100 MG tablet TAKE 1 TABLET(100 MG) BY MOUTH TWICE DAILY 180 tablet 3  . lisinopril (PRINIVIL,ZESTRIL) 20 MG tablet Take 1 tablet (20 mg total) by mouth at bedtime. 30 tablet 0  . metoprolol succinate (TOPROL-XL) 50 MG 24 hr tablet TAKE 1 TABLET BY MOUTH DAILY.TAKE WITH OR IMMEDIATELY FOLOWING A MEAL 90 tablet 0  . nitroGLYCERIN (NITROSTAT) 0.4 MG SL tablet DISSOLVE 1 TABLET UNDER THE TONGUE EVERY 5 MINUTES AS NEEDED FOR CHEST PAIN OR SHORTNESS OF BREATH 75 tablet 0  . polyethylene glycol (MIRALAX / GLYCOLAX) packet Take 17 g by mouth. Take with 4 oz liquid  daily, hold for diarrhea     No current facility-administered medications for this visit.    Allergies  Allergen Reactions  . Latex Rash  . Penicillins Rash    Has patient had a PCN reaction causing immediate rash, facial/tongue/throat swelling, SOB or lightheadedness with hypotension: No Has patient had a PCN reaction causing severe rash involving mucus membranes or skin necrosis: No Has patient had a PCN reaction that required hospitalization No Has patient had a PCN reaction occurring within the last 10 years: No If all of the above answers are "NO", then may proceed with Cephalosporin use.  . Tape Rash    Past Medical History  Diagnosis Date  . CAD (coronary artery disease) 1997     with CABG  . Stroke (HCC)   . Hypertension   . Hypercholesterolemia   . Peroneal DVT (deep venous thrombosis) (HCC)   . Osteoporosis   . Seizures (HCC)   . Post-menopausal   . Heart attack (HCC)   . Peripheral vascular disease (HCC)     Left superficial femoral artery  . Hemiparesis and alteration of sensations as late effects of stroke (HCC) 03/14/2013    left  . Headache(784.0) 03/14/2013  . Cervical spine fracture [facility](HCC)     Not requiring surgery  . Palpitations   . Foot drop, left   . Gait disturbance     quad cane  . Periorbital hematoma of left eye   .  Urinary incontinence   . Constipation     Past Surgical History  Procedure Laterality Date  . Cataract extraction    . Coronary artery bypass graft      x3    Social History   Social History  . Marital Status: Widowed    Spouse Name: N/A  . Number of Children: 5  . Years of Education: 10   Occupational History  . Retired    Social History Main Topics  . Smoking status: Never Smoker   . Smokeless tobacco: Never Used  . Alcohol Use: No  . Drug Use: No  . Sexual Activity: Not on file   Other Topics Concern  . Not on file   Social History Narrative   Patient is a widowed and lives with her daughter [name](Sherry).    Patient has three living children and two are deceased.   Patient is retired.   Patient has a 10th grade education.   Patient is right-handed.   Patient occasionally drinks caffeine.          Family History  Problem Relation Age of Onset  . Heart attack Mother   . Heart attack Father   . Heart attack Sister   . Heart attack Brother     ROS: no fevers or chills, productive cough, hemoptysis, dysphasia, odynophagia, melena, hematochezia, dysuria, hematuria, rash, seizure activity, orthopnea, PND, pedal edema, claudication. Remaining systems are negative.  Physical Exam:   There were no vitals taken for this visit.  General:  Well developed/well nourished in NAD Skin warm/dry Patient not depressed No peripheral clubbing Back-normal HEENT-normal/normal eyelids Neck supple/normal carotid upstroke bilaterally; no bruits; no JVD; no thyromegaly chest - CTA/ normal expansion CV - RRR/normal S1 and S2; no murmurs, rubs or gallops;  PMI nondisplaced Abdomen -NT/ND, no HSM, no mass, + bowel sounds, no bruit 2+ femoral pulses, no bruits Ext-no edema, chords, 2+ DP Neuro-grossly nonfocal  ECG    This encounter was created in error - please disregard.

## 2016-03-04 ENCOUNTER — Encounter: Payer: Medicare PPO | Admitting: Cardiology

## 2016-04-24 DIAGNOSIS — I10 Essential (primary) hypertension: Secondary | ICD-10-CM | POA: Diagnosis not present

## 2016-04-24 DIAGNOSIS — G40409 Other generalized epilepsy and epileptic syndromes, not intractable, without status epilepticus: Secondary | ICD-10-CM | POA: Diagnosis not present

## 2016-04-24 DIAGNOSIS — Z1389 Encounter for screening for other disorder: Secondary | ICD-10-CM | POA: Diagnosis not present

## 2016-04-24 DIAGNOSIS — E441 Mild protein-calorie malnutrition: Secondary | ICD-10-CM | POA: Diagnosis not present

## 2016-04-24 DIAGNOSIS — E785 Hyperlipidemia, unspecified: Secondary | ICD-10-CM | POA: Diagnosis not present

## 2016-04-24 DIAGNOSIS — I69959 Hemiplegia and hemiparesis following unspecified cerebrovascular disease affecting unspecified side: Secondary | ICD-10-CM | POA: Diagnosis not present

## 2016-04-24 DIAGNOSIS — I739 Peripheral vascular disease, unspecified: Secondary | ICD-10-CM | POA: Diagnosis not present

## 2016-04-24 DIAGNOSIS — Z0000 Encounter for general adult medical examination without abnormal findings: Secondary | ICD-10-CM | POA: Diagnosis not present

## 2016-04-24 DIAGNOSIS — I25118 Atherosclerotic heart disease of native coronary artery with other forms of angina pectoris: Secondary | ICD-10-CM | POA: Diagnosis not present

## 2016-06-02 ENCOUNTER — Other Ambulatory Visit: Payer: Self-pay | Admitting: Nurse Practitioner

## 2016-06-19 ENCOUNTER — Ambulatory Visit: Payer: Self-pay | Admitting: Nurse Practitioner

## 2016-06-30 DIAGNOSIS — R310 Gross hematuria: Secondary | ICD-10-CM | POA: Diagnosis not present

## 2016-07-04 ENCOUNTER — Other Ambulatory Visit: Payer: Self-pay | Admitting: Nurse Practitioner

## 2016-11-23 DIAGNOSIS — I10 Essential (primary) hypertension: Secondary | ICD-10-CM | POA: Diagnosis not present

## 2016-11-23 DIAGNOSIS — I69959 Hemiplegia and hemiparesis following unspecified cerebrovascular disease affecting unspecified side: Secondary | ICD-10-CM | POA: Diagnosis not present

## 2016-11-23 DIAGNOSIS — E785 Hyperlipidemia, unspecified: Secondary | ICD-10-CM | POA: Diagnosis not present

## 2016-11-23 DIAGNOSIS — M810 Age-related osteoporosis without current pathological fracture: Secondary | ICD-10-CM | POA: Diagnosis not present

## 2016-11-23 DIAGNOSIS — R319 Hematuria, unspecified: Secondary | ICD-10-CM | POA: Diagnosis not present

## 2016-11-23 DIAGNOSIS — I739 Peripheral vascular disease, unspecified: Secondary | ICD-10-CM | POA: Diagnosis not present

## 2016-11-23 DIAGNOSIS — G40309 Generalized idiopathic epilepsy and epileptic syndromes, not intractable, without status epilepticus: Secondary | ICD-10-CM | POA: Diagnosis not present

## 2016-12-10 DIAGNOSIS — M810 Age-related osteoporosis without current pathological fracture: Secondary | ICD-10-CM | POA: Diagnosis not present

## 2016-12-10 DIAGNOSIS — M8588 Other specified disorders of bone density and structure, other site: Secondary | ICD-10-CM | POA: Diagnosis not present

## 2017-02-07 ENCOUNTER — Other Ambulatory Visit: Payer: Self-pay | Admitting: Neurology

## 2017-02-09 ENCOUNTER — Other Ambulatory Visit: Payer: Self-pay | Admitting: Neurology

## 2017-02-09 NOTE — Telephone Encounter (Signed)
Patient's daughter calling to get refill for lamoTRIgine (LAMICTAL) 100 MG tablet. [facility]

## 2017-02-09 NOTE — Telephone Encounter (Signed)
LVM for pt daughter to call and make appt. Has been over a year since she has been seen. Gave [facility] phone number.  Needs to be seen once a year for med refills.

## 2017-02-15 ENCOUNTER — Ambulatory Visit: Payer: Self-pay | Admitting: Nurse Practitioner

## 2017-02-16 ENCOUNTER — Encounter: Payer: Self-pay | Admitting: Nurse Practitioner

## 2017-03-15 ENCOUNTER — Other Ambulatory Visit: Payer: Self-pay | Admitting: Neurology

## 2017-03-16 ENCOUNTER — Encounter (INDEPENDENT_AMBULATORY_CARE_PROVIDER_SITE_OTHER): Payer: Self-pay

## 2017-03-16 ENCOUNTER — Ambulatory Visit (INDEPENDENT_AMBULATORY_CARE_PROVIDER_SITE_OTHER): Payer: Medicare PPO | Admitting: Nurse Practitioner

## 2017-03-16 ENCOUNTER — Encounter: Payer: Self-pay | Admitting: Nurse Practitioner

## 2017-03-16 VITALS — BP 151/69 | HR 52 | Wt 170.4 lb

## 2017-03-16 DIAGNOSIS — Z8673 Personal history of transient ischemic attack (TIA), and cerebral infarction without residual deficits: Secondary | ICD-10-CM | POA: Diagnosis not present

## 2017-03-16 DIAGNOSIS — I69398 Other sequelae of cerebral infarction: Secondary | ICD-10-CM | POA: Diagnosis not present

## 2017-03-16 DIAGNOSIS — I69359 Hemiplegia and hemiparesis following cerebral infarction affecting unspecified side: Secondary | ICD-10-CM | POA: Diagnosis not present

## 2017-03-16 DIAGNOSIS — G40909 Epilepsy, unspecified, not intractable, without status epilepticus: Secondary | ICD-10-CM

## 2017-03-16 DIAGNOSIS — R269 Unspecified abnormalities of gait and mobility: Secondary | ICD-10-CM

## 2017-03-16 MED ORDER — LAMOTRIGINE 100 MG PO TABS: ORAL_TABLET | ORAL | 3 refills | Status: DC

## 2017-03-16 NOTE — Progress Notes (Signed)
[facility]  PATIENT: [name] [name] J Basurto DOB: 03/05/1941   REASON FOR VISIT: Follow-up for epilepsy, history of stroke with hemiparesis HISTORY FROM: Patient and daughter Cordelia PenSherry    HISTORY OF PRESENT ILLNESS: Ms. [name] [name], 76 year old female returns for follow-up. She has a history of right brain stroke that occurred in December 2010 with significant left hemiparesis. She remains on Plavix and aspirin without further stroke or TIA symptoms. She also has a history of seizure disorder with breakthrough seizure 04/02/2015. She was on Keppra at the time and could not tolerate higher doses due to increased headache. She was successfully switched to Lamictal 100 mg twice daily, and she titrated off of her Keppra. She has not had further seizure events. She denies any side effects to the Lamictal. She lives with her daughter, requires assistance for all activities of daily living. She denies any new symptoms of numbness weakness of the face arms or legs. She has rare headache relieved with Tylenol. She ambulates in the home with a quad cane. She has fallen twice in the last year. No apparent injury She returns for reevaluation  HISTORY [name]. Bedore is a 76 year old right-handed black female with a history of cerebrovascular disease with a right brain stroke and a significant left hemiparesis. The patient has had intermittent seizures, the last seizure was approximately one year ago, but the patient was in the emergency room on 04/02/2015 with a recurrent seizure. The patient had a brief warning that the seizure was going to occur, and then went into a generalized event. The patient has been on Keppra taking 250 mg daily, she indicates that she has not tolerated higher doses secondary to increased headaches. The patient does not operate a motor vehicle. She has not noted any new symptoms of numbness or weakness on the face, arms, or legs. She does have intermittent headaches. She returns to this  office for an evaluation.   REVIEW OF SYSTEMS: Full 14 system review of systems performed and notable only for those listed, all others are neg:  Constitutional: neg  Cardiovascular: neg Ear/Nose/Throat: neg  Skin: neg Eyes: neg Respiratory: neg Gastroitestinal: neg Hematology/Lymphatic: neg  Endocrine: neg Musculoskeletal: Weakness, gait disturbance Allergy/Immunology: neg Neurological: History of seizures occasional headache Psychiatric: neg Sleep : neg   ALLERGIES: Allergies  Allergen Reactions  . Latex Rash  . Penicillins Rash    Has patient had a PCN reaction causing immediate rash, facial/tongue/throat swelling, SOB or lightheadedness with hypotension: No Has patient had a PCN reaction causing severe rash involving mucus membranes or skin necrosis: No Has patient had a PCN reaction that required hospitalization No Has patient had a PCN reaction occurring within the last 10 years: No If all of the above answers are "NO", then may proceed with Cephalosporin use.  . Tape Rash    HOME MEDICATIONS: Outpatient Medications Prior to Visit  Medication Sig Dispense Refill  . acetaminophen (TYLENOL) 500 MG tablet Take 1-2 tablets (500-1,000 mg total) by mouth every 8 (eight) hours as needed for mild pain. 90 tablet 0  . aspirin EC 81 MG tablet Take 1 tablet (81 mg total) by mouth daily.    Marland Kitchen. atorvastatin (LIPITOR) 20 MG tablet Take 1 tablet (20 mg total) by mouth every evening. 30 tablet 0  . cholecalciferol (VITAMIN D) 1000 units tablet Take 1 tablet (1,000 Units total) by mouth daily. 30 tablet 0  . clopidogrel (PLAVIX) 75 MG tablet Take 1 tablet (75 mg total) by mouth daily. 30 tablet 0  .  lamoTRIgine (LAMICTAL) 100 MG tablet TAKE 1 TABLET(100 MG) BY MOUTH TWICE DAILY 60 tablet 0  . levETIRAcetam (KEPPRA) 500 MG tablet Take 100 mg by mouth daily.    Marland Kitchen lisinopril (PRINIVIL,ZESTRIL) 20 MG tablet Take 1 tablet (20 mg total) by mouth at bedtime. 30 tablet 0  . metoprolol  succinate (TOPROL-XL) 50 MG 24 hr tablet TAKE 1 TABLET BY MOUTH DAILY.TAKE WITH OR IMMEDIATELY FOLOWING A MEAL 90 tablet 0  . nitroGLYCERIN (NITROSTAT) 0.4 MG SL tablet DISSOLVE 1 TABLET UNDER THE TONGUE EVERY 5 MINUTES AS NEEDED FOR CHEST PAIN OR SHORTNESS OF BREATH 75 tablet 0  . polyethylene glycol (MIRALAX / GLYCOLAX) packet Take 17 g by mouth. Take with 4 oz liquid daily, hold for diarrhea    . lamoTRIgine (LAMICTAL) 100 MG tablet TAKE 1 TABLET(100 MG) BY MOUTH TWICE DAILY 180 tablet 3   No facility-administered medications prior to visit.     PAST MEDICAL HISTORY: Past Medical History:  Diagnosis Date  . CAD (coronary artery disease) 1997    with CABG  . Cervical spine fracture [facility])    Not requiring surgery  . Chest pain   . Constipation   . Foot drop, left   . Gait disturbance    quad cane  . Headache(784.0) 03/14/2013  . Heart attack (HCC)   . Hemiparesis and alteration of sensations as late effects of stroke (HCC) 03/14/2013   left  . Hypercholesterolemia   . Hypertension   . Osteoporosis   . Palpitations   . Periorbital hematoma of left eye   . Peripheral vascular disease (HCC)    Left superficial femoral artery  . Peroneal DVT (deep venous thrombosis) (HCC)   . Post-menopausal   . PVD (peripheral vascular disease) (HCC)   . Seizures (HCC)   . Stroke (HCC)   . Stroke (HCC) 2010  . Urinary incontinence     PAST SURGICAL HISTORY: Past Surgical History:  Procedure Laterality Date  . CATARACT EXTRACTION    . CORONARY ARTERY BYPASS GRAFT     x3    FAMILY HISTORY: Family History  Problem Relation Age of Onset  . Heart attack Mother   . Heart attack Father   . Heart attack Sister   . Heart attack Brother     SOCIAL HISTORY: Social History   Social History  . Marital status: Widowed    Spouse name: N/A  . Number of children: 5  . Years of education: 10   Occupational History  . Retired    Social History Main Topics  . Smoking status: Never Smoker   . Smokeless tobacco: Never Used  . Alcohol use No  . Drug use: No  . Sexual activity: Not on file   Other Topics Concern  . Not on file   Social History Narrative   Patient is a widowed and lives with her daughter [name].   Patient has three living children and two are deceased.   Patient is retired.   Patient has a 10th grade education.   Patient is right-handed.   Patient occasionally drinks caffeine.           PHYSICAL EXAM  Vitals:   03/16/17 1546  BP: (!) 151/69  Pulse: (!) 52  Weight: 170 lb 6.4 oz (77.3 kg)   Body mass index is 25.16 kg/m.  Generalized: Well developed, in no acute distress  Seated in wheelchair Head: normocephalic and atraumatic,. Oropharynx benign  Neck: Supple, no carotid bruits  Cardiac: Regular rate rhythm,  no murmur   Neurological examination  Mentation: Alert oriented to time, place, history taking. Attention span and concentration appropriate. Recent and remote memory intact.  Follows all commands speech and language fluent.   Cranial nerve II-XII: Pupils were equal round reactive to light extraocular movements were full, visual field were full with the exception of a mild left inferior quadrantopsia. Facial sensation and strength were normal. Hearing was intact to finger rubbing bilaterally. Uvula tongue midline. head turning and shoulder shrug were normal and symmetric.Tongue protrusion into cheek strength was normal. Motor: normal bulk and tone, full strength in the BUE, BLE, on the right. On the left side the patient has significant weakness of the left upper extremity with minimal grip and flexion extension minimal at the elbow. She cannot raise the left arm above the head . 3/5 strength with hip flexion and knee extension and wears a left ankle and foot brace. Sensory: normal and symmetric to light touch, pinprick, and  Vibration, on the right decreased on the left  Coordination: finger-nose-finger, heel-to-shin bilaterally, no  dysmetria on the right unable to perform on the left. Reflexes: Symmetric upper and lower  Gait and Station: In wheelchair not ambulated   DIAGNOSTIC DATA (LABS, IMAGING, TESTING) - I reviewed patient records, labs, notes, testing and imaging myself where available.  Lab Results  Component Value Date   WBC 4.3 12/09/2015   HGB 12.2 12/09/2015   HCT 40 12/09/2015   MCV 87.1 12/03/2015   PLT 207 12/09/2015      Component Value Date/Time   NA 141 12/09/2015   K 4.3 12/09/2015   CL 105 12/03/2015 0357   CO2 25 12/03/2015 0357   GLUCOSE 95 12/03/2015 0357   BUN 14 12/09/2015   CREATININE 1.0 12/09/2015   CREATININE 0.82 12/03/2015 0357   CALCIUM 9.2 12/03/2015 0357   PROT 7.0 12/01/2015 1634   ALBUMIN 3.4 (L) 12/01/2015 1634   AST 24 12/01/2015 1634   ALT 22 12/01/2015 1634   ALKPHOS 77 12/01/2015 1634   BILITOT 0.5 12/01/2015 1634   GFRNONAA >60 12/03/2015 0357   GFRAA >60 12/03/2015 0357    ASSESSMENT AND PLAN  76 y.o. year old female  has a past medical history of Right brain stroke in 2010 with left hemiparesis. Also has a history of seizures. Last seizure event occurred 04/02/2015 and her medication was changed at that time from Keppra to Lamictal.  Continue Lamictal 100 mg twice daily,  Call for any seizure activity Continue Plavix and Lipitor for secondary stroke prevention Follow-up yearly  Nilda Riggs, [facility], [facility], APRN  [facility] [address], [address] [place], [place] [address] [phone]

## 2017-03-16 NOTE — Progress Notes (Signed)
I have read the note, and I agree with the clinical assessment and plan.  WILLIS,CHARLES KEITH   

## 2017-03-16 NOTE — Patient Instructions (Signed)
Continue Lamictal 100 mg twice daily,  Call for any seizure activity Continue Plavix and Lipitor for secondary stroke prevention Follow-up yearly

## 2017-08-20 DIAGNOSIS — I2510 Atherosclerotic heart disease of native coronary artery without angina pectoris: Secondary | ICD-10-CM | POA: Diagnosis not present

## 2017-08-20 DIAGNOSIS — M810 Age-related osteoporosis without current pathological fracture: Secondary | ICD-10-CM | POA: Diagnosis not present

## 2017-08-20 DIAGNOSIS — Z0000 Encounter for general adult medical examination without abnormal findings: Secondary | ICD-10-CM | POA: Diagnosis not present

## 2017-08-20 DIAGNOSIS — Z1389 Encounter for screening for other disorder: Secondary | ICD-10-CM | POA: Diagnosis not present

## 2017-08-20 DIAGNOSIS — I10 Essential (primary) hypertension: Secondary | ICD-10-CM | POA: Diagnosis not present

## 2017-08-20 DIAGNOSIS — I739 Peripheral vascular disease, unspecified: Secondary | ICD-10-CM | POA: Diagnosis not present

## 2017-08-20 DIAGNOSIS — I69354 Hemiplegia and hemiparesis following cerebral infarction affecting left non-dominant side: Secondary | ICD-10-CM | POA: Diagnosis not present

## 2017-08-20 DIAGNOSIS — M79672 Pain in left foot: Secondary | ICD-10-CM | POA: Diagnosis not present

## 2017-08-20 DIAGNOSIS — E785 Hyperlipidemia, unspecified: Secondary | ICD-10-CM | POA: Diagnosis not present

## 2017-09-17 DIAGNOSIS — M21372 Foot drop, left foot: Secondary | ICD-10-CM | POA: Diagnosis not present

## 2017-12-10 ENCOUNTER — Other Ambulatory Visit: Payer: Self-pay | Admitting: Nurse Practitioner

## 2017-12-17 ENCOUNTER — Telehealth: Payer: Self-pay | Admitting: Nurse Practitioner

## 2017-12-17 MED ORDER — LAMOTRIGINE 100 MG PO TABS
ORAL_TABLET | ORAL | 3 refills | Status: DC
Start: 2017-12-17 — End: 2018-06-29

## 2017-12-17 NOTE — Addendum Note (Signed)
Addended by: Hermenia FiscalYOUNG, Osaze Hubbert S on: 12/17/2017 12:15 PM   Modules accepted: Orders

## 2017-12-17 NOTE — Telephone Encounter (Signed)
Pt daughter calling [id number]:lamoTRIgine (LAMICTAL) 100 MG tablet she has been told there was a denial re: refilling this medicaiton at this time.  Daughter(on DPR) states that pt took her last pill this morning.  Please call

## 2018-03-15 NOTE — Progress Notes (Deleted)
[facility]  PATIENT: [name] [name] J Basurto DOB: 03/05/1941   REASON FOR VISIT: Follow-up for epilepsy, history of stroke with hemiparesis HISTORY FROM: Patient and daughter Cordelia PenSherry    HISTORY OF PRESENT ILLNESS: Ms. [name] [name], 77 year old female returns for follow-up. She has a history of right brain stroke that occurred in December 2010 with significant left hemiparesis. She remains on Plavix and aspirin without further stroke or TIA symptoms. She also has a history of seizure disorder with breakthrough seizure 04/02/2015. She was on Keppra at the time and could not tolerate higher doses due to increased headache. She was successfully switched to Lamictal 100 mg twice daily, and she titrated off of her Keppra. She has not had further seizure events. She denies any side effects to the Lamictal. She lives with her daughter, requires assistance for all activities of daily living. She denies any new symptoms of numbness weakness of the face arms or legs. She has rare headache relieved with Tylenol. She ambulates in the home with a quad cane. She has fallen twice in the last year. No apparent injury She returns for reevaluation  HISTORY [name]. Bedore is a 77 year old right-handed black female with a history of cerebrovascular disease with a right brain stroke and a significant left hemiparesis. The patient has had intermittent seizures, the last seizure was approximately one year ago, but the patient was in the emergency room on 04/02/2015 with a recurrent seizure. The patient had a brief warning that the seizure was going to occur, and then went into a generalized event. The patient has been on Keppra taking 250 mg daily, she indicates that she has not tolerated higher doses secondary to increased headaches. The patient does not operate a motor vehicle. She has not noted any new symptoms of numbness or weakness on the face, arms, or legs. She does have intermittent headaches. She returns to this  office for an evaluation.   REVIEW OF SYSTEMS: Full 14 system review of systems performed and notable only for those listed, all others are neg:  Constitutional: neg  Cardiovascular: neg Ear/Nose/Throat: neg  Skin: neg Eyes: neg Respiratory: neg Gastroitestinal: neg Hematology/Lymphatic: neg  Endocrine: neg Musculoskeletal: Weakness, gait disturbance Allergy/Immunology: neg Neurological: History of seizures occasional headache Psychiatric: neg Sleep : neg   ALLERGIES: Allergies  Allergen Reactions  . Latex Rash  . Penicillins Rash    Has patient had a PCN reaction causing immediate rash, facial/tongue/throat swelling, SOB or lightheadedness with hypotension: No Has patient had a PCN reaction causing severe rash involving mucus membranes or skin necrosis: No Has patient had a PCN reaction that required hospitalization No Has patient had a PCN reaction occurring within the last 10 years: No If all of the above answers are "NO", then may proceed with Cephalosporin use.  . Tape Rash    HOME MEDICATIONS: Outpatient Medications Prior to Visit  Medication Sig Dispense Refill  . acetaminophen (TYLENOL) 500 MG tablet Take 1-2 tablets (500-1,000 mg total) by mouth every 8 (eight) hours as needed for mild pain. 90 tablet 0  . aspirin EC 81 MG tablet Take 1 tablet (81 mg total) by mouth daily.    Marland Kitchen. atorvastatin (LIPITOR) 20 MG tablet Take 1 tablet (20 mg total) by mouth every evening. 30 tablet 0  . cholecalciferol (VITAMIN D) 1000 units tablet Take 1 tablet (1,000 Units total) by mouth daily. 30 tablet 0  . clopidogrel (PLAVIX) 75 MG tablet Take 1 tablet (75 mg total) by mouth daily. 30 tablet 0  .  lamoTRIgine (LAMICTAL) 100 MG tablet TAKE 1 TABLET(100 MG) BY MOUTH TWICE DAILY 180 tablet 3  . lisinopril (PRINIVIL,ZESTRIL) 20 MG tablet Take 1 tablet (20 mg total) by mouth at bedtime. 30 tablet 0  . metoprolol succinate (TOPROL-XL) 50 MG 24 hr tablet TAKE 1 TABLET BY MOUTH DAILY.TAKE  WITH OR IMMEDIATELY FOLOWING A MEAL 90 tablet 0  . nitroGLYCERIN (NITROSTAT) 0.4 MG SL tablet DISSOLVE 1 TABLET UNDER THE TONGUE EVERY 5 MINUTES AS NEEDED FOR CHEST PAIN OR SHORTNESS OF BREATH 75 tablet 0  . polyethylene glycol (MIRALAX / GLYCOLAX) packet Take 17 g by mouth. Take with 4 oz liquid daily, hold for diarrhea     No facility-administered medications prior to visit.     PAST MEDICAL HISTORY: Past Medical History:  Diagnosis Date  . CAD (coronary artery disease) 1997    with CABG  . Cervical spine fracture [facility])    Not requiring surgery  . Chest pain   . Constipation   . Foot drop, left   . Gait disturbance    quad cane  . Headache(784.0) 03/14/2013  . Heart attack (HCC)   . Hemiparesis and alteration of sensations as late effects of stroke (HCC) 03/14/2013   left  . Hypercholesterolemia   . Hypertension   . Osteoporosis   . Palpitations   . Periorbital hematoma of left eye   . Peripheral vascular disease (HCC)    Left superficial femoral artery  . Peroneal DVT (deep venous thrombosis) (HCC)   . Post-menopausal   . PVD (peripheral vascular disease) (HCC)   . Seizures (HCC)   . Stroke (HCC)   . Stroke (HCC) 2010  . Urinary incontinence     PAST SURGICAL HISTORY: Past Surgical History:  Procedure Laterality Date  . CATARACT EXTRACTION    . CORONARY ARTERY BYPASS GRAFT     x3    FAMILY HISTORY: Family History  Problem Relation Age of Onset  . Heart attack Mother   . Heart attack Father   . Heart attack Sister   . Heart attack Brother     SOCIAL HISTORY: Social History   Socioeconomic History  . Marital status: Widowed    Spouse name: Not on file  . Number of children: 5  . Years of education: 10  . Highest education level: Not on file  Occupational History  . Occupation: Retired  Engineer, production  . Financial resource strain: Not on file  . Food insecurity:    Worry: Not on file    Inability: Not on file  . Transportation needs:    Medical:  Not on file    Non-medical: Not on file  Tobacco Use  . Smoking status: Never Smoker  . Smokeless tobacco: Never Used  Substance and Sexual Activity  . Alcohol use: No  . Drug use: No  . Sexual activity: Not on file  Lifestyle  . Physical activity:    Days per week: Not on file    Minutes per session: Not on file  . Stress: Not on file  Relationships  . Social connections:    Talks on phone: Not on file    Gets together: Not on file    Attends religious service: Not on file    Active member of club or organization: Not on file    Attends meetings of clubs or organizations: Not on file    Relationship status: Not on file  . Intimate partner violence:    Fear of current or ex partner:  Not on file    Emotionally abused: Not on file    Physically abused: Not on file    Forced sexual activity: Not on file  Other Topics Concern  . Not on file  Social History Narrative   Patient is a widowed and lives with her daughter [name](Sherry).   Patient has three living children and two are deceased.   Patient is retired.   Patient has a 10th grade education.   Patient is right-handed.   Patient occasionally drinks caffeine.        PHYSICAL EXAM  There were no vitals filed for this visit. There is no height or weight on file to calculate BMI.  Generalized: Well developed, in no acute distress  Seated in wheelchair Head: normocephalic and atraumatic,. Oropharynx benign  Neck: Supple, no carotid bruits  Cardiac: Regular rate rhythm, no murmur   Neurological examination  Mentation: Alert oriented to time, place, history taking. Attention span and concentration appropriate. Recent and remote memory intact.  Follows all commands speech and language fluent.   Cranial nerve II-XII: Pupils were equal round reactive to light extraocular movements were full, visual field were full with the exception of a mild left inferior quadrantopsia. Facial sensation and strength were normal. Hearing was  intact to finger rubbing bilaterally. Uvula tongue midline. head turning and shoulder shrug were normal and symmetric.Tongue protrusion into cheek strength was normal. Motor: normal bulk and tone, full strength in the BUE, BLE, on the right. On the left side the patient has significant weakness of the left upper extremity with minimal grip and flexion extension minimal at the elbow. She cannot raise the left arm above the head . 3/5 strength with hip flexion and knee extension and wears a left ankle and foot brace. Sensory: normal and symmetric to light touch, pinprick, and  Vibration, on the right decreased on the left  Coordination: finger-nose-finger, heel-to-shin bilaterally, no dysmetria on the right unable to perform on the left. Reflexes: Symmetric upper and lower  Gait and Station: In wheelchair not ambulated   DIAGNOSTIC DATA (LABS, IMAGING, TESTING) - I reviewed patient records, labs, notes, testing and imaging myself where available.  Lab Results  Component Value Date   WBC 4.3 12/09/2015   HGB 12.2 12/09/2015   HCT 40 12/09/2015   MCV 87.1 12/03/2015   PLT 207 12/09/2015      Component Value Date/Time   NA 141 12/09/2015   K 4.3 12/09/2015   CL 105 12/03/2015 0357   CO2 25 12/03/2015 0357   GLUCOSE 95 12/03/2015 0357   BUN 14 12/09/2015   CREATININE 1.0 12/09/2015   CREATININE 0.82 12/03/2015 0357   CALCIUM 9.2 12/03/2015 0357   PROT 7.0 12/01/2015 1634   ALBUMIN 3.4 (L) 12/01/2015 1634   AST 24 12/01/2015 1634   ALT 22 12/01/2015 1634   ALKPHOS 77 12/01/2015 1634   BILITOT 0.5 12/01/2015 1634   GFRNONAA >60 12/03/2015 0357   GFRAA >60 12/03/2015 0357    ASSESSMENT AND PLAN  77 y.o. year old female  has a past medical history of Right brain stroke in 2010 with left hemiparesis. Also has a history of seizures. Last seizure event occurred 04/02/2015 and her medication was changed at that time from Keppra to Lamictal.  Continue Lamictal 100 mg twice daily,  Call  for any seizure activity Continue Plavix and Lipitor for secondary stroke prevention Follow-up yearly  Nilda RiggsNancy Carolyn Ange Puskas, [facility] LPGNP, [facility], APRN  [facility] [address], [address]  [place], [place] [address] [phone]

## 2018-03-16 ENCOUNTER — Ambulatory Visit: Payer: Self-pay | Admitting: Nurse Practitioner

## 2018-03-16 ENCOUNTER — Telehealth: Payer: Self-pay | Admitting: *Deleted

## 2018-03-16 NOTE — Telephone Encounter (Signed)
Patient called this morning to cancel and reschedule her follow up that was this afternoon. She stated her daughter is her transportation, and her daughter has to work.

## 2018-03-17 ENCOUNTER — Encounter: Payer: Self-pay | Admitting: Nurse Practitioner

## 2018-04-08 DIAGNOSIS — R51 Headache: Secondary | ICD-10-CM | POA: Diagnosis not present

## 2018-04-08 DIAGNOSIS — G40309 Generalized idiopathic epilepsy and epileptic syndromes, not intractable, without status epilepticus: Secondary | ICD-10-CM | POA: Diagnosis not present

## 2018-04-08 DIAGNOSIS — Z1389 Encounter for screening for other disorder: Secondary | ICD-10-CM | POA: Diagnosis not present

## 2018-04-08 DIAGNOSIS — I10 Essential (primary) hypertension: Secondary | ICD-10-CM | POA: Diagnosis not present

## 2018-04-08 DIAGNOSIS — E785 Hyperlipidemia, unspecified: Secondary | ICD-10-CM | POA: Diagnosis not present

## 2018-04-08 DIAGNOSIS — I69354 Hemiplegia and hemiparesis following cerebral infarction affecting left non-dominant side: Secondary | ICD-10-CM | POA: Diagnosis not present

## 2018-04-08 DIAGNOSIS — I2510 Atherosclerotic heart disease of native coronary artery without angina pectoris: Secondary | ICD-10-CM | POA: Diagnosis not present

## 2018-06-28 NOTE — Progress Notes (Signed)
[facility]  PATIENT: [name] [name] DOB: Jul 14, 1941   REASON FOR VISIT: Follow-up for epilepsy, history of stroke with hemiparesis HISTORY FROM: Patient and daughter [name] [name]    HISTORY OF PRESENT ILLNESS: UPDATE 10/30/2019CM [name] [name], 77 year old female returns for follow-up with history of stroke in December 2010 with continued left hemiparesis.  She remains on Plavix and aspirin for secondary stroke prevention without further stroke or TIA symptoms.  She also has a history of seizure disorder and is on Lamictal 100 mg milligrams twice daily.  Last seizure was 2016.  She continues to live  with her daughter, dependent for activities of daily living.  Blood pressure noted to be elevated in the office today she has not taken her blood pressure medication.  One recent fall no apparent injury.  She ambulates in the house with a quad cane.  She returns for reevaluation   03/16/17 CMMs. [name] [name], 77 year old female returns for follow-up. She has a history of right brain stroke that occurred in December 2010 with significant left hemiparesis. She remains on Plavix and aspirin without further stroke or TIA symptoms. She also has a history of seizure disorder with breakthrough seizure 04/02/2015. She was on Keppra at the time and could not tolerate higher doses due to increased headache. She was successfully switched to Lamictal 100 mg twice daily, and she titrated off of her Keppra. She has not had further seizure events. She denies any side effects to the Lamictal. She lives with her daughter, requires assistance for all activities of daily living. She denies any new symptoms of numbness weakness of the face arms or legs. She has rare headache relieved with Tylenol. She ambulates in the home with a quad cane. She has fallen twice in the last year. No apparent injury She returns for reevaluation  HISTORY [name]. [name] [name] is a 77 year old right-handed black female with a history of cerebrovascular  disease with a right brain stroke and a significant left hemiparesis. The patient has had intermittent seizures, the last seizure was approximately one year ago, but the patient was in the emergency room on 04/02/2015 with a recurrent seizure. The patient had a brief warning that the seizure was going to occur, and then went into a generalized event. The patient has been on Keppra taking 250 mg daily, she indicates that she has not tolerated higher doses secondary to increased headaches. The patient does not operate a motor vehicle. She has not noted any new symptoms of numbness or weakness on the face, arms, or legs. She does have intermittent headaches. She returns to this office for an evaluation.   REVIEW OF SYSTEMS: Full 14 system review of systems performed and notable only for those listed, all others are neg:  Constitutional: neg  Cardiovascular: neg Ear/Nose/Throat: neg  Skin: neg Eyes: neg Respiratory: neg Gastroitestinal: neg Hematology/Lymphatic: neg  Endocrine: neg Musculoskeletal: Weakness, gait disturbance Allergy/Immunology: neg Neurological: History of seizures occasional headache Psychiatric: neg Sleep : neg   ALLERGIES: Allergies  Allergen Reactions  . Latex Rash  . Penicillins Rash    Has patient had a PCN reaction causing immediate rash, facial/tongue/throat swelling, SOB or lightheadedness with hypotension: No Has patient had a PCN reaction causing severe rash involving mucus membranes or skin necrosis: No Has patient had a PCN reaction that required hospitalization No Has patient had a PCN reaction occurring within the last 10 years: No If all of the above answers are "NO", then may proceed with Cephalosporin use.  . Tape Rash  HOME MEDICATIONS: Outpatient Medications Prior to Visit  Medication Sig Dispense Refill  . acetaminophen (TYLENOL) 500 MG tablet Take 1-2 tablets (500-1,000 mg total) by mouth every 8 (eight) hours as needed for mild pain. 90 tablet  0  . aspirin EC 81 MG tablet Take 1 tablet (81 mg total) by mouth daily.    Marland Kitchen atorvastatin (LIPITOR) 20 MG tablet Take 1 tablet (20 mg total) by mouth every evening. 30 tablet 0  . cholecalciferol (VITAMIN D) 1000 units tablet Take 1 tablet (1,000 Units total) by mouth daily. 30 tablet 0  . clopidogrel (PLAVIX) 75 MG tablet Take 1 tablet (75 mg total) by mouth daily. 30 tablet 0  . lamoTRIgine (LAMICTAL) 100 MG tablet TAKE 1 TABLET(100 MG) BY MOUTH TWICE DAILY 180 tablet 3  . lisinopril (PRINIVIL,ZESTRIL) 20 MG tablet Take 1 tablet (20 mg total) by mouth at bedtime. 30 tablet 0  . metoprolol succinate (TOPROL-XL) 50 MG 24 hr tablet TAKE 1 TABLET BY MOUTH DAILY.TAKE WITH OR IMMEDIATELY FOLOWING A MEAL 90 tablet 0  . nitroGLYCERIN (NITROSTAT) 0.4 MG SL tablet DISSOLVE 1 TABLET UNDER THE TONGUE EVERY 5 MINUTES AS NEEDED FOR CHEST PAIN OR SHORTNESS OF BREATH (Patient not taking: Reported on 06/29/2018) 75 tablet 0  . polyethylene glycol (MIRALAX / GLYCOLAX) packet Take 17 g by mouth. Take with 4 oz liquid daily, hold for diarrhea     No facility-administered medications prior to visit.     PAST MEDICAL HISTORY: Past Medical History:  Diagnosis Date  . CAD (coronary artery disease) 1997    with CABG  . Cervical spine fracture [facility])    Not requiring surgery  . Chest pain   . Constipation   . Foot drop, left   . Gait disturbance    quad cane  . Headache(784.0) 03/14/2013  . Heart attack (HCC)   . Hemiparesis and alteration of sensations as late effects of stroke (HCC) 03/14/2013   left  . Hypercholesterolemia   . Hypertension   . Osteoporosis   . Palpitations   . Periorbital hematoma of left eye   . Peripheral vascular disease (HCC)    Left superficial femoral artery  . Peroneal DVT (deep venous thrombosis)   . Post-menopausal   . PVD (peripheral vascular disease) (HCC)   . Seizures (HCC)   . Stroke (HCC)   . Stroke (HCC) 2010  . Urinary incontinence     PAST SURGICAL  HISTORY: Past Surgical History:  Procedure Laterality Date  . CATARACT EXTRACTION    . CORONARY ARTERY BYPASS GRAFT     x3    FAMILY HISTORY: Family History  Problem Relation Age of Onset  . Heart attack Mother   . Heart attack Father   . Heart attack Sister   . Heart attack Brother     SOCIAL HISTORY: Social History   Socioeconomic History  . Marital status: Widowed    Spouse name: Not on file  . Number of children: 5  . Years of education: 10  . Highest education level: Not on file  Occupational History  . Occupation: Retired  Engineer, production  . Financial resource strain: Not on file  . Food insecurity:    Worry: Not on file    Inability: Not on file  . Transportation needs:    Medical: Not on file    Non-medical: Not on file  Tobacco Use  . Smoking status: Never Smoker  . Smokeless tobacco: Never Used  Substance and Sexual Activity  .  Alcohol use: No  . Drug use: No  . Sexual activity: Not on file  Lifestyle  . Physical activity:    Days per week: Not on file    Minutes per session: Not on file  . Stress: Not on file  Relationships  . Social connections:    Talks on phone: Not on file    Gets together: Not on file    Attends religious service: Not on file    Active member of club or organization: Not on file    Attends meetings of clubs or organizations: Not on file    Relationship status: Not on file  . Intimate partner violence:    Fear of current or ex partner: Not on file    Emotionally abused: Not on file    Physically abused: Not on file    Forced sexual activity: Not on file  Other Topics Concern  . Not on file  Social History Narrative   Patient is a widowed and lives with her daughter [name] [name]).   Patient has three living children and two are deceased.   Patient is retired.   Patient has a 10th grade education.   Patient is right-handed.   Patient does not drink caffeine.        PHYSICAL EXAM  Vitals:   06/29/18 0737  BP: (!)  176/73  Pulse: (!) 54  Weight: 170 lb (77.1 kg)  Height: 5\' 9"  (1.753 m)   Body mass index is 25.1 kg/m.  Generalized: Well developed, in no acute distress  Seated in wheelchair Head: normocephalic and atraumatic,. Oropharynx benign  Neck: Supple, no carotid bruits  Cardiac: Regular rate rhythm, no murmur   Neurological examination  Mentation: Alert oriented to time, place, history taking. Attention span and concentration appropriate. Recent and remote memory intact.  Follows all commands speech and language fluent.   Cranial nerve II-XII: Pupils were equal round reactive to light extraocular movements were full, visual field were full with the exception of a mild left inferior quadrantopsia. Facial sensation and strength were normal. Hearing was intact to finger rubbing bilaterally. Uvula tongue midline. head turning and shoulder shrug were normal and symmetric.Tongue protrusion into cheek strength was normal. Motor: normal bulk and tone, full strength in the BUE, BLE, on the right. On the left side the patient has significant weakness of the left upper extremity with minimal grip and flexion extension minimal at the elbow. She cannot raise the left arm above the head . 3/5 strength with hip flexion and knee extension and wears a left ankle and foot brace. Sensory: normal and symmetric to light touch, pinprick, and  Vibration, on the right decreased on the left  Coordination: finger-nose-finger, heel-to-shin bilaterally, no dysmetria on the right unable to perform on the left. Reflexes: Symmetric upper and lower  Gait and Station: In wheelchair not ambulated   DIAGNOSTIC DATA (LABS, IMAGING, TESTING) - I reviewed patient records, labs, notes, testing and imaging myself where available.  Lab Results  Component Value Date   WBC 4.3 12/09/2015   HGB 12.2 12/09/2015   HCT 40 12/09/2015   MCV 87.1 12/03/2015   PLT 207 12/09/2015      Component Value Date/Time   NA 141 12/09/2015   K  4.3 12/09/2015   CL 105 12/03/2015 0357   CO2 25 12/03/2015 0357   GLUCOSE 95 12/03/2015 0357   BUN 14 12/09/2015   CREATININE 1.0 12/09/2015   CREATININE 0.82 12/03/2015 0357   CALCIUM 9.2 12/03/2015 0357  PROT 7.0 12/01/2015 1634   ALBUMIN 3.4 (L) 12/01/2015 1634   AST 24 12/01/2015 1634   ALT 22 12/01/2015 1634   ALKPHOS 77 12/01/2015 1634   BILITOT 0.5 12/01/2015 1634   GFRNONAA >60 12/03/2015 0357   GFRAA >60 12/03/2015 0357    ASSESSMENT AND PLAN  77 y.o. year old female  has a past medical history of Right brain stroke in 2010 with left hemiparesis. Also has a history of seizures. Last seizure event occurred 04/02/2015 and her medication was changed at that time from Keppra to Lamictal.  Continue Lamictal 100 mg twice daily,  Call for any seizure activity Continue Plavix and Lipitor for secondary stroke prevention Stay well-hydrated, increase fiber in diet for constipation Follow-up yearly  Nilda Riggs, [facility], [facility], APRN  [facility] [address] [place], [place] [address] [phone]

## 2018-06-29 ENCOUNTER — Encounter: Payer: Self-pay | Admitting: Nurse Practitioner

## 2018-06-29 ENCOUNTER — Encounter

## 2018-06-29 ENCOUNTER — Ambulatory Visit (INDEPENDENT_AMBULATORY_CARE_PROVIDER_SITE_OTHER): Payer: Medicare PPO | Admitting: Nurse Practitioner

## 2018-06-29 VITALS — BP 176/73 | HR 54 | Ht 69.0 in | Wt 170.0 lb

## 2018-06-29 DIAGNOSIS — I69398 Other sequelae of cerebral infarction: Secondary | ICD-10-CM | POA: Diagnosis not present

## 2018-06-29 DIAGNOSIS — G40909 Epilepsy, unspecified, not intractable, without status epilepticus: Secondary | ICD-10-CM

## 2018-06-29 DIAGNOSIS — R269 Unspecified abnormalities of gait and mobility: Secondary | ICD-10-CM | POA: Diagnosis not present

## 2018-06-29 DIAGNOSIS — I69359 Hemiplegia and hemiparesis following cerebral infarction affecting unspecified side: Secondary | ICD-10-CM

## 2018-06-29 DIAGNOSIS — Z8673 Personal history of transient ischemic attack (TIA), and cerebral infarction without residual deficits: Secondary | ICD-10-CM

## 2018-06-29 MED ORDER — LAMOTRIGINE 100 MG PO TABS: ORAL_TABLET | ORAL | 3 refills | Status: DC

## 2018-06-29 NOTE — Patient Instructions (Signed)
Continue Lamictal 100 mg twice daily,  Call for any seizure activity Continue Plavix and Lipitor for secondary stroke prevention Follow-up yearly   

## 2018-06-29 NOTE — Progress Notes (Signed)
I have read the note, and I agree with the clinical assessment and plan.  Charles K Willis   

## 2018-07-14 ENCOUNTER — Other Ambulatory Visit: Payer: Self-pay

## 2018-07-14 ENCOUNTER — Emergency Department (HOSPITAL_COMMUNITY)
Admission: EM | Admit: 2018-07-14 | Discharge: 2018-07-14 | Disposition: A | Discharge status: Home / Self Care | Payer: Medicare PPO | Attending: Emergency Medicine | Admitting: Emergency Medicine

## 2018-07-14 ENCOUNTER — Emergency Department (HOSPITAL_COMMUNITY): Payer: Medicare PPO

## 2018-07-14 ENCOUNTER — Encounter (HOSPITAL_COMMUNITY): Payer: Self-pay

## 2018-07-14 DIAGNOSIS — Y92009 Unspecified place in unspecified non-institutional (private) residence as the place of occurrence of the external cause: Secondary | ICD-10-CM | POA: Insufficient documentation

## 2018-07-14 DIAGNOSIS — Z23 Encounter for immunization: Secondary | ICD-10-CM | POA: Insufficient documentation

## 2018-07-14 DIAGNOSIS — Y999 Unspecified external cause status: Secondary | ICD-10-CM | POA: Diagnosis not present

## 2018-07-14 DIAGNOSIS — Z7901 Long term (current) use of anticoagulants: Secondary | ICD-10-CM | POA: Diagnosis not present

## 2018-07-14 DIAGNOSIS — Y939 Activity, unspecified: Secondary | ICD-10-CM | POA: Insufficient documentation

## 2018-07-14 DIAGNOSIS — S0993XA Unspecified injury of face, initial encounter: Secondary | ICD-10-CM | POA: Diagnosis not present

## 2018-07-14 DIAGNOSIS — I10 Essential (primary) hypertension: Secondary | ICD-10-CM | POA: Diagnosis not present

## 2018-07-14 DIAGNOSIS — S0990XA Unspecified injury of head, initial encounter: Secondary | ICD-10-CM | POA: Diagnosis not present

## 2018-07-14 DIAGNOSIS — R450 Nervousness: Secondary | ICD-10-CM | POA: Diagnosis not present

## 2018-07-14 DIAGNOSIS — I2510 Atherosclerotic heart disease of native coronary artery without angina pectoris: Secondary | ICD-10-CM | POA: Diagnosis not present

## 2018-07-14 DIAGNOSIS — W19XXXA Unspecified fall, initial encounter: Secondary | ICD-10-CM | POA: Insufficient documentation

## 2018-07-14 DIAGNOSIS — S0181XA Laceration without foreign body of other part of head, initial encounter: Secondary | ICD-10-CM

## 2018-07-14 DIAGNOSIS — R51 Headache: Secondary | ICD-10-CM | POA: Diagnosis not present

## 2018-07-14 DIAGNOSIS — Z7982 Long term (current) use of aspirin: Secondary | ICD-10-CM | POA: Insufficient documentation

## 2018-07-14 DIAGNOSIS — R58 Hemorrhage, not elsewhere classified: Secondary | ICD-10-CM | POA: Diagnosis not present

## 2018-07-14 MED ORDER — LIDOCAINE-EPINEPHRINE-TETRACAINE (LET) SOLUTION
3.0000 mL | Freq: Once | NASAL | Status: AC
  Administered 2018-07-14: 3 mL via TOPICAL
  Filled 2018-07-14: qty 3

## 2018-07-14 MED ORDER — LIDOCAINE-EPINEPHRINE (PF) 2 %-1:200000 IJ SOLN
20.0000 mL | Freq: Once | INTRAMUSCULAR | Status: AC
  Administered 2018-07-14: 20 mL
  Filled 2018-07-14: qty 20

## 2018-07-14 MED ORDER — TETANUS-DIPHTHERIA TOXOIDS TD 5-2 LFU IM INJ
0.5000 mL | INJECTION | Freq: Once | INTRAMUSCULAR | Status: AC
  Administered 2018-07-14: 0.5 mL via INTRAMUSCULAR
  Filled 2018-07-14: qty 0.5

## 2018-07-14 NOTE — ED Triage Notes (Signed)
Pt fall secondary to slipping off chair. Laceration noted at right chin. With dressing applied by EMS. Pt on blood thinners. Hx of stroke with left sided contractures. Denies LOC.

## 2018-07-14 NOTE — ED Provider Notes (Signed)
[facility]  CSN: [id number] Arrival date & time: 07/14/18  1903    History   Chief Complaint Chief Complaint  Patient presents with  . Laceration  . Fall    HPI (per initial EDP, Dr. Lorre NickAnthony Allen) 77 year old female here for mechanical fall at home where she fell and struck the right side of her face.  No loss of consciousness.  Denies any head or neck discomfort.  Denies any discomfort to her chest abdomen or lower back.  No hip pain.  Does complain of pain to her right mandible with a laceration to her right chin.  Also notes some intraoral injury to a partial lower denture.  EMS called and patient transported here.  Patient does use Plavix for history of CVA  Past Medical History:  Diagnosis Date  . CAD (coronary artery disease) 1997    with CABG  . Cervical spine fracture [facility](HCC)    Not requiring surgery  . Chest pain   . Constipation   . Foot drop, left   . Gait disturbance    quad cane  . Headache(784.0) 03/14/2013  . Heart attack (HCC)   . Hemiparesis and alteration of sensations as late effects of stroke (HCC) 03/14/2013   left  . Hypercholesterolemia   . Hypertension   . Osteoporosis   . Palpitations   . Periorbital hematoma of left eye   . Peripheral vascular disease (HCC)    Left superficial femoral artery  . Peroneal DVT (deep venous thrombosis)   . Post-menopausal   . PVD (peripheral vascular disease) (HCC)   . Seizures (HCC)   . Stroke (HCC)   . Stroke (HCC) 2010  . Urinary incontinence     Patient Active Problem List   Diagnosis Date Noted  . History of stroke 03/16/2017  . Urinary incontinence   . Periorbital hematoma of left eye   . Gait disturbance   . Foot drop, left   . Constipation 12/05/2015  . Chest pain 12/01/2015  . Fall at home 12/01/2015  . HTN (hypertension) 12/01/2015  . Common bile duct dilatation 12/01/2015  . Compression fracture of L1 lumbar vertebra (HCC) 12/01/2015  .  Abnormality of gait 11/15/2015  . Seizure disorder (HCC) 11/08/2013  . Hemiparesis and alteration of sensations as late effects of stroke (HCC) 03/14/2013  . Headache 03/14/2013    Past Surgical History:  Procedure Laterality Date  . CATARACT EXTRACTION    . CORONARY ARTERY BYPASS GRAFT     x3     OB History   None      Home Medications    Prior to Admission medications   Medication Sig Start Date End Date Taking? Authorizing Provider  acetaminophen (TYLENOL) 500 MG tablet Take 1-2 tablets (500-1,000 mg total) by mouth every 8 (eight) hours as needed for mild pain. 12/18/15  Yes Mast, Man X, NP  aspirin EC 81 MG tablet Take 1 tablet (81 mg total) by mouth daily. 02/02/14  Yes Ronal FearLam, Lynn E, NP  atorvastatin (LIPITOR) 20 MG tablet Take 1 tablet (20 mg total) by mouth every evening. 12/18/15  Yes Mast, Man X, NP  cholecalciferol (VITAMIN D) 1000 units tablet Take 1 tablet (1,000 Units total) by mouth daily. 12/18/15  Yes Mast, Man X, NP  clopidogrel (PLAVIX) 75 MG tablet Take 1 tablet (75 mg total) by mouth daily. 12/18/15  Yes Mast, Man X, NP  lamoTRIgine (LAMICTAL) 100 MG tablet TAKE 1 TABLET(100 MG) BY MOUTH TWICE  DAILY Patient taking differently: Take 100 mg by mouth 2 (two) times daily.  06/29/18  Yes Nilda Riggs, NP  lisinopril (PRINIVIL,ZESTRIL) 20 MG tablet Take 1 tablet (20 mg total) by mouth at bedtime. 12/18/15  Yes Mast, Man X, NP  metoprolol succinate (TOPROL-XL) 50 MG 24 hr tablet TAKE 1 TABLET BY MOUTH DAILY.TAKE WITH OR IMMEDIATELY FOLOWING A MEAL Patient taking differently: Take 50 mg by mouth daily.  12/18/15  Yes Mast, Man X, NP  nitroGLYCERIN (NITROSTAT) 0.4 MG SL tablet DISSOLVE 1 TABLET UNDER THE TONGUE EVERY 5 MINUTES AS NEEDED FOR CHEST PAIN OR SHORTNESS OF BREATH Patient taking differently: Place 0.4 mg under the tongue every 5 (five) minutes as needed for chest pain.  12/18/15  Yes Mast, Man X, NP  polyethylene glycol (MIRALAX / GLYCOLAX) packet Take 17 g by  mouth. Take with 4 oz liquid daily, hold for diarrhea   Yes [provider]    Family History Family History  Problem Relation Age of Onset  . Heart attack Mother   . Heart attack Father   . Heart attack Sister   . Heart attack Brother     Social History Social History   Tobacco Use  . Smoking status: Never Smoker  . Smokeless tobacco: Never Used  Substance Use Topics  . Alcohol use: No  . Drug use: No     Allergies   Latex; Penicillins; and Tape   Review of Systems Review of Systems   Physical Exam Updated Vital Signs BP (!) 193/82   Pulse 63   Temp 98.1 F (36.7 C) (Oral)   Resp 18   Ht 5\' 9"  (1.753 m)   Wt 77.1 kg   SpO2 99%   BMI 25.10 kg/m   Physical Exam Performed by Dr. Lorre Nick  ED Treatments / Results  Labs (all labs ordered are listed, but only abnormal results are displayed) Labs Reviewed - No data to display  EKG None  Radiology Ct Head Wo Contrast  Result Date: 07/14/2018 CLINICAL DATA:  Recent trip and fall with facial injury and headaches, initial encounter EXAM: CT HEAD WITHOUT CONTRAST CT MAXILLOFACIAL WITHOUT CONTRAST TECHNIQUE: Multidetector CT imaging of the head and maxillofacial structures were performed using the standard protocol without intravenous contrast. Multiplanar CT image reconstructions of the maxillofacial structures were also generated. COMPARISON:  12/01/2015 FINDINGS: CT HEAD FINDINGS Brain: Mild atrophic changes are noted. Changes of prior right-sided MCA infarct are again seen and stable. No findings to suggest acute hemorrhage, acute infarction or space-occupying mass lesion are noted. Vascular: No hyperdense vessel or unexpected calcification. Skull: Normal. Negative for fracture or focal lesion. Other: None. CT MAXILLOFACIAL FINDINGS Osseous: No acute bony abnormality is identified. The patient is predominately edentulous. Orbits: Orbits and their contents are within normal limits. Sinuses: Paranasal  sinuses are well aerated. No air-fluid levels are seen. Soft tissues: Considerable soft tissue swelling is noted over the chin and right side of the mandible consistent with the recent injury. No focal hematoma is seen. No other soft tissue abnormality is noted. Considerable cerumen is noted in both ear canals. IMPRESSION: CT of the head: Changes of prior right MCA infarct without acute abnormality. Mild atrophic changes are noted. CT of the maxillofacial bones: Considerable soft tissue swelling over the right half of the mandible consistent with the recent injury. No acute bony abnormality is seen. Electronically Signed   By: Alcide Clever M.D.   On: 07/14/2018 20:33   Ct Maxillofacial Wo Cm  Result Date: 07/14/2018 CLINICAL DATA:  Recent trip and fall with facial injury and headaches, initial encounter EXAM: CT HEAD WITHOUT CONTRAST CT MAXILLOFACIAL WITHOUT CONTRAST TECHNIQUE: Multidetector CT imaging of the head and maxillofacial structures were performed using the standard protocol without intravenous contrast. Multiplanar CT image reconstructions of the maxillofacial structures were also generated. COMPARISON:  12/01/2015 FINDINGS: CT HEAD FINDINGS Brain: Mild atrophic changes are noted. Changes of prior right-sided MCA infarct are again seen and stable. No findings to suggest acute hemorrhage, acute infarction or space-occupying mass lesion are noted. Vascular: No hyperdense vessel or unexpected calcification. Skull: Normal. Negative for fracture or focal lesion. Other: None. CT MAXILLOFACIAL FINDINGS Osseous: No acute bony abnormality is identified. The patient is predominately edentulous. Orbits: Orbits and their contents are within normal limits. Sinuses: Paranasal sinuses are well aerated. No air-fluid levels are seen. Soft tissues: Considerable soft tissue swelling is noted over the chin and right side of the mandible consistent with the recent injury. No focal hematoma is seen. No other soft tissue  abnormality is noted. Considerable cerumen is noted in both ear canals. IMPRESSION: CT of the head: Changes of prior right MCA infarct without acute abnormality. Mild atrophic changes are noted. CT of the maxillofacial bones: Considerable soft tissue swelling over the right half of the mandible consistent with the recent injury. No acute bony abnormality is seen. Electronically Signed   By: Alcide Clever M.D.   On: 07/14/2018 20:33    Procedures .Marland KitchenLaceration Repair Date/Time: 07/14/2018 11:31 PM Performed by: Windy Carina, PA-C Authorized by: Windy Carina, PA-C   Consent:    Consent obtained:  Verbal   Consent given by:  Patient   Risks discussed:  Pain, poor cosmetic result and poor wound healing   Alternatives discussed:  No treatment Anesthesia (see MAR for exact dosages):    Anesthesia method:  Local infiltration   Local anesthetic:  Lidocaine 2% WITH epi Laceration details:    Location:  Face   Face location:  Chin   Length (cm):  2   Depth (mm):  2 Repair type:    Repair type:  Simple Pre-procedure details:    Preparation:  Patient was prepped and draped in usual sterile fashion and imaging obtained to evaluate for foreign bodies Exploration:    Hemostasis achieved with:  Direct pressure and LET   Wound exploration: wound explored through full range of motion and entire depth of wound probed and visualized   Treatment:    Area cleansed with:  Betadine Skin repair:    Repair method:  Sutures   Suture size:  4-0   Suture material:  Prolene   Number of sutures:  2 Approximation:    Approximation:  Close Post-procedure details:    Dressing:  Sterile dressing   Patient tolerance of procedure:  Tolerated well, no immediate complications   (including critical care time)  Medications Ordered in ED Medications  tetanus & diphtheria toxoids (adult) (TENIVAC) injection 0.5 mL (0.5 mLs Intramuscular Given 07/14/18 2239)  lidocaine-EPINEPHrine (XYLOCAINE W/EPI) 2  %-1:200000 (PF) injection 20 mL (20 mLs Infiltration Given by Other 07/14/18 2324)  lidocaine-EPINEPHrine-tetracaine (LET) solution (3 mLs Topical Given 07/14/18 2242)     Initial Impression / Assessment and Plan / ED Course  Triage vital signs and the nursing notes have been reviewed.  Pertinent labs & imaging results that were available during care of the patient were reviewed and considered in medical decision making (see chart for details).   77 yo female  on anticoagulant presented for mechanical fall. Imaging and injury evaluation performed by initial EDP Dr. Lorre Nick. No head or face injuries on CT scans. Per Dr. Freida Busman request, this EDP performed the laceration repair to the chin. 2 sutures placed without complication. Education provided to patient on s/s of infection that would warrant sooner medical follow-up.  Final Clinical Impressions(s) / ED Diagnoses   Dispo: Home. After thorough clinical evaluation, this patient is determined to be medically stable and can be safely discharged with the previously mentioned treatment and/or outpatient follow-up/referral(s). At this time, there are no other apparent medical conditions that require further screening, evaluation or treatment.   Final diagnoses:  Facial laceration, initial encounter  Injury of head, initial encounter    ED Discharge Orders    None        Reva Bores 07/14/18 2335    Lorre Nick, MD 07/18/18 1331

## 2018-07-14 NOTE — Discharge Instructions (Addendum)
Have sutures removed in 5 to 7 days °

## 2018-07-14 NOTE — ED Notes (Signed)
Patient verbalizes understanding of medications and discharge instructions. No further questions at this time. VSS and patient ambulatory at discharge.   

## 2018-07-14 NOTE — ED Provider Notes (Signed)
[facility] EMERGENCY DEPARTMENT Provider Note   CSN: [id number] Arrival date & time: 07/14/18  1903     History   Chief Complaint Chief Complaint  Patient presents with  . Laceration  . Fall    HPI [name] [name] is a 77 y.o. female.  77 year old female here for mechanical fall at home where she fell and struck the right side of her face.  No loss of consciousness.  Denies any head or neck discomfort.  Denies any discomfort to her chest abdomen or lower back.  No hip pain.  Does complain of pain to her right mandible with a laceration to her right chin.  Also notes some intraoral injury to a partial lower denture.  EMS called and patient transported here.  Patient does use Plavix for history of CVA     Past Medical History:  Diagnosis Date  . CAD (coronary artery disease) 1997    with CABG  . Cervical spine fracture [facility])    Not requiring surgery  . Chest pain   . Constipation   . Foot drop, left   . Gait disturbance    quad cane  . Headache(784.0) 03/14/2013  . Heart attack (HCC)   . Hemiparesis and alteration of sensations as late effects of stroke (HCC) 03/14/2013   left  . Hypercholesterolemia   . Hypertension   . Osteoporosis   . Palpitations   . Periorbital hematoma of left eye   . Peripheral vascular disease (HCC)    Left superficial femoral artery  . Peroneal DVT (deep venous thrombosis)   . Post-menopausal   . PVD (peripheral vascular disease) (HCC)   . Seizures (HCC)   . Stroke (HCC)   . Stroke (HCC) 2010  . Urinary incontinence     Patient Active Problem List   Diagnosis Date Noted  . History of stroke 03/16/2017  . Urinary incontinence   . Periorbital hematoma of left eye   . Gait disturbance   . Foot drop, left   . Constipation 12/05/2015  . Chest pain 12/01/2015  . Fall at home 12/01/2015  . HTN (hypertension) 12/01/2015  . Common bile duct dilatation 12/01/2015  . Compression fracture of L1 lumbar vertebra (HCC)  12/01/2015  . Abnormality of gait 11/15/2015  . Seizure disorder (HCC) 11/08/2013  . Hemiparesis and alteration of sensations as late effects of stroke (HCC) 03/14/2013  . Headache 03/14/2013    Past Surgical History:  Procedure Laterality Date  . CATARACT EXTRACTION    . CORONARY ARTERY BYPASS GRAFT     x3     OB History   None      Home Medications    Prior to Admission medications   Medication Sig Start Date End Date Taking? Authorizing Provider  acetaminophen (TYLENOL) 500 MG tablet Take 1-2 tablets (500-1,000 mg total) by mouth every 8 (eight) hours as needed for mild pain. 12/18/15   Mast, Man X, NP  aspirin EC 81 MG tablet Take 1 tablet (81 mg total) by mouth daily. 02/02/14   Ronal Fear, NP  atorvastatin (LIPITOR) 20 MG tablet Take 1 tablet (20 mg total) by mouth every evening. 12/18/15   Mast, Man X, NP  cholecalciferol (VITAMIN D) 1000 units tablet Take 1 tablet (1,000 Units total) by mouth daily. 12/18/15   Mast, Man X, NP  clopidogrel (PLAVIX) 75 MG tablet Take 1 tablet (75 mg total) by mouth daily. 12/18/15   Mast, Man X, NP  lamoTRIgine (LAMICTAL) 100 MG  tablet TAKE 1 TABLET(100 MG) BY MOUTH TWICE DAILY 06/29/18   Nilda Riggs, NP  lisinopril (PRINIVIL,ZESTRIL) 20 MG tablet Take 1 tablet (20 mg total) by mouth at bedtime. 12/18/15   Mast, Man X, NP  metoprolol succinate (TOPROL-XL) 50 MG 24 hr tablet TAKE 1 TABLET BY MOUTH DAILY.TAKE WITH OR IMMEDIATELY FOLOWING A MEAL 12/18/15   Mast, Man X, NP  nitroGLYCERIN (NITROSTAT) 0.4 MG SL tablet DISSOLVE 1 TABLET UNDER THE TONGUE EVERY 5 MINUTES AS NEEDED FOR CHEST PAIN OR SHORTNESS OF BREATH Patient not taking: Reported on 06/29/2018 12/18/15   Mast, Man X, NP  polyethylene glycol (MIRALAX / GLYCOLAX) packet Take 17 g by mouth. Take with 4 oz liquid daily, hold for diarrhea    [provider]    Family History Family History  Problem Relation Age of Onset  . Heart attack Mother   . Heart attack Father   .  Heart attack Sister   . Heart attack Brother     Social History Social History   Tobacco Use  . Smoking status: Never Smoker  . Smokeless tobacco: Never Used  Substance Use Topics  . Alcohol use: No  . Drug use: No     Allergies   Latex; Penicillins; and Tape   Review of Systems Review of Systems  All other systems reviewed and are negative.    Physical Exam Updated Vital Signs BP (!) 217/86 (BP Location: Right Arm)   Pulse 68   Temp 98.1 F (36.7 C) (Oral)   Resp 18   Ht 1.753 m (5\' 9" )   Wt 77.1 kg   SpO2 100%   BMI 25.10 kg/m   Physical Exam  Constitutional: She is oriented to person, place, and time. She appears well-developed and well-nourished.  Non-toxic appearance. No distress.  HENT:  Head:    Small inner lip laceration.  Missing teeth noted from partial right mandible.  Eyes: Pupils are equal, round, and reactive to light. Conjunctivae, EOM and lids are normal.  Neck: Normal range of motion. Neck supple. No tracheal deviation present. No thyroid mass present.  Cardiovascular: Normal rate, regular rhythm and normal heart sounds. Exam reveals no gallop.  No murmur heard. Pulmonary/Chest: Effort normal and breath sounds normal. No stridor. No respiratory distress. She has no decreased breath sounds. She has no wheezes. She has no rhonchi. She has no rales.  Abdominal: Soft. Normal appearance and bowel sounds are normal. She exhibits no distension. There is no tenderness. There is no rebound and no CVA tenderness.  Musculoskeletal: Normal range of motion. She exhibits no edema or tenderness.  Neurological: She is alert and oriented to person, place, and time. She displays atrophy. She displays no tremor. No cranial nerve deficit or sensory deficit. GCS eye subscore is 4. GCS verbal subscore is 5. GCS motor subscore is 6.  At baseline per patient.  Left arm contracture noted which is baseline  Skin: Skin is warm and dry. No abrasion and no rash noted.    Psychiatric: She has a normal mood and affect. Her speech is normal and behavior is normal.  Nursing note and vitals reviewed.    ED Treatments / Results  Labs (all labs ordered are listed, but only abnormal results are displayed) Labs Reviewed - No data to display  EKG None  Radiology No results found.  Procedures Procedures (including critical care time)  Medications Ordered in ED Medications  tetanus & diphtheria toxoids (adult) (TENIVAC) injection 0.5 mL (has no administration  in time range)     Initial Impression / Assessment and Plan / ED Course  I have reviewed the triage vital signs and the nursing notes.  Pertinent labs & imaging results that were available during my care of the patient were reviewed by me and considered in my medical decision making (see chart for details).     CT of head and face negative.  Laceration repaired by physician assistant.  Patient stable for discharge  Final Clinical Impressions(s) / ED Diagnoses   Final diagnoses:  None    ED Discharge Orders    None       Lorre NickAllen, Lajoyce Tamura, MD 07/14/18 2327

## 2018-07-20 ENCOUNTER — Ambulatory Visit (HOSPITAL_COMMUNITY)
Admission: EM | Admit: 2018-07-20 | Discharge: 2018-07-20 | Disposition: A | Discharge status: Home / Self Care | Payer: Medicare PPO

## 2018-07-20 NOTE — ED Notes (Signed)
Pt here, no sutures were found on her wound. No sutures removed. PT had all questions answered.

## 2018-07-20 NOTE — ED Notes (Signed)
Pt did not have any sutures to remove, states she must have washed them out in the shower. Wound on chin healing well. Pt had no further questions.

## 2018-10-05 ENCOUNTER — Observation Stay (HOSPITAL_COMMUNITY)
Admission: EM | Admit: 2018-10-05 | Discharge: 2018-10-08 | Disposition: A | Discharge status: Home / Self Care | Payer: Medicare PPO | Attending: Family Medicine | Admitting: Family Medicine

## 2018-10-05 ENCOUNTER — Encounter (HOSPITAL_COMMUNITY): Payer: Self-pay | Admitting: Emergency Medicine

## 2018-10-05 DIAGNOSIS — S199XXA Unspecified injury of neck, initial encounter: Secondary | ICD-10-CM | POA: Diagnosis not present

## 2018-10-05 DIAGNOSIS — Z8673 Personal history of transient ischemic attack (TIA), and cerebral infarction without residual deficits: Secondary | ICD-10-CM | POA: Insufficient documentation

## 2018-10-05 DIAGNOSIS — Z951 Presence of aortocoronary bypass graft: Secondary | ICD-10-CM | POA: Insufficient documentation

## 2018-10-05 DIAGNOSIS — G40909 Epilepsy, unspecified, not intractable, without status epilepticus: Secondary | ICD-10-CM | POA: Diagnosis not present

## 2018-10-05 DIAGNOSIS — I10 Essential (primary) hypertension: Secondary | ICD-10-CM | POA: Diagnosis not present

## 2018-10-05 DIAGNOSIS — Z7901 Long term (current) use of anticoagulants: Secondary | ICD-10-CM | POA: Diagnosis not present

## 2018-10-05 DIAGNOSIS — S0003XA Contusion of scalp, initial encounter: Secondary | ICD-10-CM

## 2018-10-05 DIAGNOSIS — W19XXXA Unspecified fall, initial encounter: Secondary | ICD-10-CM | POA: Diagnosis present

## 2018-10-05 DIAGNOSIS — I2510 Atherosclerotic heart disease of native coronary artery without angina pectoris: Secondary | ICD-10-CM | POA: Diagnosis present

## 2018-10-05 DIAGNOSIS — R569 Unspecified convulsions: Principal | ICD-10-CM | POA: Insufficient documentation

## 2018-10-05 DIAGNOSIS — E785 Hyperlipidemia, unspecified: Secondary | ICD-10-CM | POA: Diagnosis not present

## 2018-10-05 NOTE — ED Triage Notes (Addendum)
Patient arrived with EMS from home , found on the floor after a fall this evening , daughter suspects seizure episode , history of stroke with left hemiparesis Ulice Dash . Denies pain at arrival , alert but disoriented .  Patient is taking anticoagulant .

## 2018-10-06 ENCOUNTER — Emergency Department (HOSPITAL_COMMUNITY): Payer: Medicare PPO

## 2018-10-06 DIAGNOSIS — I2510 Atherosclerotic heart disease of native coronary artery without angina pectoris: Secondary | ICD-10-CM | POA: Diagnosis present

## 2018-10-06 DIAGNOSIS — W19XXXA Unspecified fall, initial encounter: Secondary | ICD-10-CM

## 2018-10-06 DIAGNOSIS — I10 Essential (primary) hypertension: Secondary | ICD-10-CM | POA: Diagnosis not present

## 2018-10-06 DIAGNOSIS — E785 Hyperlipidemia, unspecified: Secondary | ICD-10-CM | POA: Diagnosis present

## 2018-10-06 DIAGNOSIS — Z8673 Personal history of transient ischemic attack (TIA), and cerebral infarction without residual deficits: Secondary | ICD-10-CM | POA: Diagnosis not present

## 2018-10-06 DIAGNOSIS — G40909 Epilepsy, unspecified, not intractable, without status epilepticus: Secondary | ICD-10-CM | POA: Diagnosis not present

## 2018-10-06 DIAGNOSIS — S199XXA Unspecified injury of neck, initial encounter: Secondary | ICD-10-CM | POA: Diagnosis not present

## 2018-10-06 DIAGNOSIS — S0003XA Contusion of scalp, initial encounter: Secondary | ICD-10-CM | POA: Diagnosis not present

## 2018-10-06 LAB — COMPREHENSIVE METABOLIC PANEL
ALK PHOS: 65 U/L (ref 38–126)
ALT: 23 U/L (ref 0–44)
AST: 23 U/L (ref 15–41)
Albumin: 3.5 g/dL (ref 3.5–5.0)
Anion gap: 7 (ref 5–15)
BILIRUBIN TOTAL: 0.4 mg/dL (ref 0.3–1.2)
BUN: 11 mg/dL (ref 8–23)
CALCIUM: 9.4 mg/dL (ref 8.9–10.3)
CHLORIDE: 105 mmol/L (ref 98–111)
CO2: 29 mmol/L (ref 22–32)
Creatinine, Ser: 0.92 mg/dL (ref 0.44–1.00)
Glucose, Bld: 103 mg/dL — ABNORMAL HIGH (ref 70–99)
Potassium: 4.3 mmol/L (ref 3.5–5.1)
Sodium: 141 mmol/L (ref 135–145)
TOTAL PROTEIN: 7 g/dL (ref 6.5–8.1)

## 2018-10-06 LAB — CBC
HCT: 40.3 % (ref 36.0–46.0)
Hemoglobin: 12.4 g/dL (ref 12.0–15.0)
MCH: 28 pg (ref 26.0–34.0)
MCHC: 30.8 g/dL (ref 30.0–36.0)
MCV: 91 fL (ref 80.0–100.0)
Platelets: 213 10*3/uL (ref 150–400)
RBC: 4.43 MIL/uL (ref 3.87–5.11)
RDW: 12.2 % (ref 11.5–15.5)
WBC: 7.8 10*3/uL (ref 4.0–10.5)
nRBC: 0 % (ref 0.0–0.2)

## 2018-10-06 LAB — URINALYSIS, ROUTINE W REFLEX MICROSCOPIC
Bilirubin Urine: NEGATIVE
Glucose, UA: NEGATIVE mg/dL
Ketones, ur: NEGATIVE mg/dL
Leukocytes, UA: NEGATIVE
Nitrite: NEGATIVE
PH: 6 (ref 5.0–8.0)
Protein, ur: NEGATIVE mg/dL
SPECIFIC GRAVITY, URINE: 1.017 (ref 1.005–1.030)

## 2018-10-06 LAB — BASIC METABOLIC PANEL
Anion gap: 11 (ref 5–15)
BUN: 8 mg/dL (ref 8–23)
CO2: 24 mmol/L (ref 22–32)
Calcium: 9.3 mg/dL (ref 8.9–10.3)
Chloride: 106 mmol/L (ref 98–111)
Creatinine, Ser: 0.77 mg/dL (ref 0.44–1.00)
GFR calc Af Amer: >60 mL/min (ref >60)
GFR calc non Af Amer: >60 mL/min (ref >60)
Glucose, Bld: 109 mg/dL — ABNORMAL HIGH (ref 70–99)
Potassium: 4.2 mmol/L (ref 3.5–5.1)
Sodium: 141 mmol/L (ref 135–145)

## 2018-10-06 LAB — PROTIME-INR
INR: 0.98
PROTHROMBIN TIME: 12.9 s (ref 11.4–15.2)

## 2018-10-06 LAB — CBC WITH DIFFERENTIAL/PLATELET
Abs Immature Granulocytes: 0.01 10*3/uL (ref 0.00–0.07)
BASOS ABS: 0 10*3/uL (ref 0.0–0.1)
Basophils Relative: 1 %
EOS ABS: 0 10*3/uL (ref 0.0–0.5)
Eosinophils Relative: 1 %
HEMATOCRIT: 42.4 % (ref 36.0–46.0)
Hemoglobin: 12.8 g/dL (ref 12.0–15.0)
IMMATURE GRANULOCYTES: 0 %
LYMPHS ABS: 1.5 10*3/uL (ref 0.7–4.0)
Lymphocytes Relative: 27 %
MCH: 27.5 pg (ref 26.0–34.0)
MCHC: 30.2 g/dL (ref 30.0–36.0)
MCV: 91.2 fL (ref 80.0–100.0)
MONOS PCT: 7 %
Monocytes Absolute: 0.4 10*3/uL (ref 0.1–1.0)
NEUTROS PCT: 64 %
NRBC: 0 % (ref 0.0–0.2)
Neutro Abs: 3.5 10*3/uL (ref 1.7–7.7)
Platelets: 224 10*3/uL (ref 150–400)
RBC: 4.65 MIL/uL (ref 3.87–5.11)
RDW: 12.3 % (ref 11.5–15.5)
WBC: 5.4 10*3/uL (ref 4.0–10.5)

## 2018-10-06 LAB — CK: CK TOTAL: 67 U/L (ref 38–234)

## 2018-10-06 MED ORDER — POLYETHYLENE GLYCOL 3350 17 G PO PACK
17.0000 g | PACK | Freq: Two times a day (BID) | ORAL | Status: DC
  Administered 2018-10-06 – 2018-10-08 (×4): 17 g via ORAL
  Filled 2018-10-06 (×5): qty 1

## 2018-10-06 MED ORDER — LACTULOSE 10 GM/15ML PO SOLN
30.0000 g | Freq: Once | ORAL | Status: AC
  Administered 2018-10-06: 30 g via ORAL
  Filled 2018-10-06: qty 60

## 2018-10-06 MED ORDER — NITROGLYCERIN 0.4 MG SL SUBL: 0.4000 mg | SUBLINGUAL_TABLET | SUBLINGUAL | Status: DC | PRN

## 2018-10-06 MED ORDER — LAMOTRIGINE 100 MG PO TABS
100.0000 mg | ORAL_TABLET | Freq: Two times a day (BID) | ORAL | Status: DC
  Administered 2018-10-06 – 2018-10-08 (×6): 100 mg via ORAL
  Filled 2018-10-06 (×7): qty 1

## 2018-10-06 MED ORDER — POLYETHYLENE GLYCOL 3350 17 G PO PACK
17.0000 g | PACK | Freq: Every day | ORAL | Status: DC | PRN
  Administered 2018-10-06: 17 g via ORAL
  Filled 2018-10-06: qty 1

## 2018-10-06 MED ORDER — HYDRALAZINE HCL 20 MG/ML IJ SOLN
5.0000 mg | INTRAMUSCULAR | Status: DC | PRN
  Filled 2018-10-06: qty 1

## 2018-10-06 MED ORDER — ONDANSETRON HCL 4 MG PO TABS: 4.0000 mg | ORAL_TABLET | Freq: Four times a day (QID) | ORAL | Status: DC | PRN

## 2018-10-06 MED ORDER — LORAZEPAM 2 MG/ML IJ SOLN
1.0000 mg | INTRAMUSCULAR | Status: DC | PRN
  Administered 2018-10-06: 1 mg via INTRAVENOUS
  Filled 2018-10-06: qty 1

## 2018-10-06 MED ORDER — METOPROLOL SUCCINATE ER 25 MG PO TB24
50.0000 mg | ORAL_TABLET | Freq: Every day | ORAL | Status: DC
  Administered 2018-10-06 – 2018-10-08 (×3): 50 mg via ORAL
  Filled 2018-10-06 (×3): qty 2

## 2018-10-06 MED ORDER — ACETAMINOPHEN 650 MG RE SUPP: 650.0000 mg | Freq: Four times a day (QID) | RECTAL | Status: DC | PRN

## 2018-10-06 MED ORDER — ONDANSETRON HCL 4 MG/2ML IJ SOLN: 4.0000 mg | Freq: Four times a day (QID) | INTRAMUSCULAR | Status: DC | PRN

## 2018-10-06 MED ORDER — ATORVASTATIN CALCIUM 10 MG PO TABS
20.0000 mg | ORAL_TABLET | Freq: Every evening | ORAL | Status: DC
  Administered 2018-10-06 – 2018-10-07 (×2): 20 mg via ORAL
  Filled 2018-10-06 (×2): qty 2

## 2018-10-06 MED ORDER — ACETAMINOPHEN 325 MG PO TABS
650.0000 mg | ORAL_TABLET | Freq: Four times a day (QID) | ORAL | Status: DC | PRN
  Administered 2018-10-06 – 2018-10-07 (×3): 650 mg via ORAL
  Filled 2018-10-06 (×3): qty 2

## 2018-10-06 MED ORDER — ASPIRIN EC 81 MG PO TBEC
81.0000 mg | DELAYED_RELEASE_TABLET | Freq: Every day | ORAL | Status: DC
  Administered 2018-10-06 – 2018-10-08 (×3): 81 mg via ORAL
  Filled 2018-10-06 (×3): qty 1

## 2018-10-06 MED ORDER — LISINOPRIL 20 MG PO TABS
20.0000 mg | ORAL_TABLET | Freq: Every day | ORAL | Status: DC
  Administered 2018-10-06 – 2018-10-07 (×2): 20 mg via ORAL
  Filled 2018-10-06 (×2): qty 1

## 2018-10-06 MED ORDER — HYDRALAZINE HCL 20 MG/ML IJ SOLN
10.0000 mg | Freq: Once | INTRAMUSCULAR | Status: AC
  Administered 2018-10-06: 10 mg via INTRAVENOUS

## 2018-10-06 MED ORDER — BISACODYL 10 MG RE SUPP: 10.0000 mg | Freq: Every day | RECTAL | Status: DC | PRN

## 2018-10-06 MED ORDER — VITAMIN D 25 MCG (1000 UNIT) PO TABS
1000.0000 [IU] | ORAL_TABLET | Freq: Every day | ORAL | Status: DC
  Administered 2018-10-06 – 2018-10-08 (×3): 1000 [IU] via ORAL
  Filled 2018-10-06 (×3): qty 1

## 2018-10-06 NOTE — Evaluation (Addendum)
Physical Therapy Evaluation Patient Details Name: [name] [name] MRN: [id number] DOB: 1940/09/06 Today's Date: 10/06/2018   History of Present Illness  Pt is a 78 y/o female with past medical history significant of hypertension, hyperlipidemia, stroke with left-sided weakness, CAD, CABG, PVD, DVT, seizure, who presents with fall and possible seizure.    Clinical Impression  Pt presented supine in bed with HOB elevated, awake and willing to participate in therapy session. Prior to admission, pt reported that she ambulated with use of a cane and required some assistance from her daughter for ADLs. Pt lives with her daughter in a single level home with a few steps to enter. Pt stated that her daughter and/or her niece and nephew can provide 24/7 supervision/assistance if needed. Pt currently requires min A for bed mobility and mod-max A x2 for transfers. Pt very limited secondary to generalized weakness as well as L sided weakness from a prior stroke. Pt would greatly benefit from further intensive therapy services at [facility] to maximize her independence with functional mobility prior to returning home with family support. Pt would continue to benefit from skilled physical therapy services at this time while admitted and after d/c to address the below listed limitations in order to improve overall safety and independence with functional mobility.     Follow Up Recommendations [facility]    Equipment Recommendations  None recommended by PT    Recommendations for Other Services Rehab consult     Precautions / Restrictions Precautions Precautions: Fall Precaution Comments: L sided weakness from prior CVA Restrictions Weight Bearing Restrictions: No      Mobility  Bed Mobility Overal bed mobility: Needs Assistance Bed Mobility: Supine to Sit     Supine to sit: Min assist     General bed mobility comments: pt achieving sitting EOB towards her L side with min A for trunk  elevation  Transfers Overall transfer level: Needs assistance Equipment used: 2 person hand held assist Transfers: Sit to/from [employer] Sit to Stand: Mod assist;+2 physical assistance Stand pivot transfers: Max assist;+2 physical assistance       General transfer comment: pt performed sit<>stand x1 from EOB and x1 from [facility]; more assistance needed with pivotal movements; pt pivoting towards R side  Ambulation/Gait                Stairs            Wheelchair Mobility    Modified Rankin (Stroke Patients Only) Modified Rankin (Stroke Patients Only) Pre-Morbid Rankin Score: Moderately severe disability Modified Rankin: Moderately severe disability     Balance Overall balance assessment: Needs assistance Sitting-balance support: Feet supported Sitting balance-Leahy Scale: Fair     Standing balance support: Bilateral upper extremity supported Standing balance-Leahy Scale: Poor                               Pertinent Vitals/Pain Pain Assessment: No/denies pain    Home Living Family/patient expects to be discharged to:: Private residence Living Arrangements: Children Available Help at Discharge: Family;Available PRN/intermittently Type of Home: House Home Access: Stairs to enter Entrance Stairs-Rails: Right Entrance Stairs-Number of Steps: 4 Home Layout: One level Home Equipment: Cane - quad;Shower seat      Prior Function Level of Independence: Needs assistance   Gait / Transfers Assistance Needed: ambulates with use of a cane  ADL's / Homemaking Assistance Needed: requires assistance from daughter with bathing  Hand Dominance        Extremity/Trunk Assessment   Upper Extremity Assessment Upper Extremity Assessment: Defer to OT evaluation;LUE deficits/detail LUE Deficits / Details: prior CVA - no active movement    Lower Extremity Assessment Lower Extremity Assessment: LLE deficits/detail LLE Deficits /  Details: prior CVA - grossly 2/5 throughout       Communication   Communication: No difficulties  Cognition Arousal/Alertness: Awake/alert Behavior During Therapy: WFL for tasks assessed/performed Overall Cognitive Status: Impaired/Different from baseline Area of Impairment: Memory;Following commands;Safety/judgement;Problem solving                     Memory: Decreased short-term memory Following Commands: Follows one step commands consistently;Follows multi-step commands inconsistently Safety/Judgement: Decreased awareness of safety;Decreased awareness of deficits   Problem Solving: Slow processing;Decreased initiation;Difficulty sequencing;Requires verbal cues;Requires tactile cues        General Comments      Exercises     Assessment/Plan    PT Assessment Patient needs continued PT services  PT Problem List Decreased strength;Decreased balance;Decreased mobility;Decreased coordination;Decreased safety awareness;Decreased knowledge of precautions       PT Treatment Interventions DME instruction;Gait training;Functional mobility training;Therapeutic activities;Therapeutic exercise;Balance training;Neuromuscular re-education;Patient/family education;Stair training    PT Goals (Current goals can be found in the Care Plan section)  Acute Rehab PT Goals Patient Stated Goal: "to get stronger" PT Goal Formulation: With patient Time For Goal Achievement: 10/20/18 Potential to Achieve Goals: Good    Frequency Min 3X/week   Barriers to discharge        Co-evaluation               AM-PAC PT "6 Clicks" Mobility  Outcome Measure Help needed turning from your back to your side while in a flat bed without using bedrails?: A Little Help needed moving from lying on your back to sitting on the side of a flat bed without using bedrails?: A Little Help needed moving to and from a bed to a chair (including a wheelchair)?: A Lot Help needed standing up from a chair  using your arms (e.g., wheelchair or bedside chair)?: A Lot Help needed to walk in hospital room?: A Lot Help needed climbing 3-5 steps with a railing? : Total 6 Click Score: 13    End of Session Equipment Utilized During Treatment: Gait belt Activity Tolerance: Patient tolerated treatment well Patient left: in chair;with call bell/phone within reach;with chair alarm set Nurse Communication: Mobility status PT Visit Diagnosis: Other abnormalities of gait and mobility (R26.89)    Time: 6568-1275 PT Time Calculation (min) (ACUTE ONLY): 32 min   Charges:   PT Evaluation $PT Eval Moderate Complexity: 1 Mod PT Treatments $Therapeutic Activity: 8-22 mins        Deborah Chalk, PT, DPT  Acute Rehabilitation Services Pager [phone] Office [phone]    Alessandra Bevels Elfego Giammarino 10/06/2018, 4:57 PM

## 2018-10-06 NOTE — Progress Notes (Addendum)
Patient seen and evaluated, chart reviewed, please see EMR for updated orders. Please see full H&P dictated by admitting physician Dr Clyde Lundborg for same date of service.    S/p Fall--- ??  Syncope...  ???  Seizure  Continue Lamictal, await echo  Await PT and  OT eval  Constipation---- dulcolax supp, miralax and lactulose  Shon Hale, MD

## 2018-10-06 NOTE — H&P (Addendum)
History and Physical    [name] [name] AOZ:[id number] DOB: 06/24/1941 DOA: 2Alinda [name]/12/2018  Referring MD/NP/PA:   PCP: Deatra JamesSun, Vyvyan, MD   Patient coming from:  The patient is coming from home.  At baseline, pt is independent for most of ADL.        Chief Complaint: fall and possible seizure  HPI: [name] [name] is a 78 y.o. female with medical history significant of hypertension, hyperlipidemia, stroke with left-sided weakness, CAD, CABG, PVD, DVT, seizure, who presents with fall and possible seizure.  Per her daughter, patient was found on the floor in the bathroom in the evening. Pt was getting ready for bed and does not remember what happened.  Daughter heard her fall from the other room.  When daughter came to investigate. patient was having lipsmacking, grunting, eyes deviating. She was unresponsive for about 3 minutes. Her daughter suspects that the patient may have had seizure. Pt reports that head hurt, and was found to have left parietal scalp hematoma. When I saw pt in ED, she is drowsy, but is oriented x 3.  Patient denies any chest pain, shortness breath, cough, fever or chills.  No nausea, vomiting, diarrhea, abdominal pain.  Patient states that she has burning on urination. Per her daughter, patient has chronic intermittent burning sensation due to vaginal atrophy.  No dysuria or increased urinary frequency.  Patient has left-sided weakness from previous stroke which has not changed.  Per her daughter, patient is taking her Lamictal for seizure consistently.  ED Course: pt was found to have WBC 5.4, electrolytes renal function okay, CK=47, INR 0.98, urinalysis not impressive, temperature normal, heart rate 60s, oxygen saturation 99% on room air.  CT head is negative for acute intracranial abnormalities, but showed old right MCA stroke.  CT of C-spine is negative for acute bony issues.  Patient is placed on telemetry bed for observation.  Review of Systems:   General: no fevers, chills, no body  weight gain, has fatigue HEENT: no blurry vision, hearing changes or sore throat Respiratory: no dyspnea, coughing, wheezing CV: no chest pain, no palpitations GI: no nausea, vomiting, abdominal pain, diarrhea, constipation GU: no dysuria, has burning on urination, no increased urinary frequency, hematuria  Ext: no leg edema Neuro: has fall and possible seizure, has chronic left-sided weakness. Has drowsiness. Skin: no rash. Has scalp hematoma MSK: No muscle spasm, no deformity, no limitation of range of movement in spin Heme: No easy bruising.  Travel history: No recent [place] distant travel.  Allergy:  Allergies  Allergen Reactions  . Latex Rash  . Penicillins Rash    Has patient had a PCN reaction causing immediate rash, facial/tongue/throat swelling, SOB or lightheadedness with hypotension: No Has patient had a PCN reaction causing severe rash involving mucus membranes or skin necrosis: No Has patient had a PCN reaction that required hospitalization No Has patient had a PCN reaction occurring within the last 10 years: No If all of the above answers are "NO", then may proceed with Cephalosporin use.  . Tape Rash    Past Medical History:  Diagnosis Date  . CAD (coronary artery disease) 1997    with CABG  . Cervical spine fracture [facility](HCC)    Not requiring surgery  . Chest pain   . Constipation   . Foot drop, left   . Gait disturbance    quad cane  . Headache(784.0) 03/14/2013  . Heart attack (HCC)   . Hemiparesis and alteration of sensations as late effects of stroke (  HCC) 03/14/2013   left  . Hypercholesterolemia   . Hypertension   . Osteoporosis   . Palpitations   . Periorbital hematoma of left eye   . Peripheral vascular disease (HCC)    Left superficial femoral artery  . Peroneal DVT (deep venous thrombosis)   . Post-menopausal   . PVD (peripheral vascular disease) (HCC)   . Seizures (HCC)   . Stroke (HCC)   . Stroke (HCC) 2010  . Urinary incontinence     Past  Surgical History:  Procedure Laterality Date  . CATARACT EXTRACTION    . CORONARY ARTERY BYPASS GRAFT     x3    Social History:  reports that she has never smoked. She has never used smokeless tobacco. She reports that she does not drink alcohol or use drugs.  Family History:  Family History  Problem Relation Age of Onset  . Heart attack Mother   . Heart attack Father   . Heart attack Sister   . Heart attack Brother      Prior to Admission medications   Medication Sig Start Date End Date Taking? Authorizing Provider  acetaminophen (TYLENOL) 500 MG tablet Take 1-2 tablets (500-1,000 mg total) by mouth every 8 (eight) hours as needed for mild pain. 12/18/15  Yes Mast, Man X, NP  aspirin EC 81 MG tablet Take 1 tablet (81 mg total) by mouth daily. 02/02/14  Yes Ronal Fear, NP  atorvastatin (LIPITOR) 20 MG tablet Take 1 tablet (20 mg total) by mouth every evening. 12/18/15  Yes Mast, Man X, NP  cholecalciferol (VITAMIN D) 1000 units tablet Take 1 tablet (1,000 Units total) by mouth daily. 12/18/15  Yes Mast, Man X, NP  clopidogrel (PLAVIX) 75 MG tablet Take 1 tablet (75 mg total) by mouth daily. 12/18/15  Yes Mast, Man X, NP  lamoTRIgine (LAMICTAL) 100 MG tablet TAKE 1 TABLET(100 MG) BY MOUTH TWICE DAILY Patient taking differently: Take 100 mg by mouth 2 (two) times daily.  06/29/18  Yes Nilda Riggs, NP  lisinopril (PRINIVIL,ZESTRIL) 20 MG tablet Take 1 tablet (20 mg total) by mouth at bedtime. 12/18/15  Yes Mast, Man X, NP  metoprolol succinate (TOPROL-XL) 50 MG 24 hr tablet TAKE 1 TABLET BY MOUTH DAILY.TAKE WITH OR IMMEDIATELY FOLOWING A MEAL Patient taking differently: Take 50 mg by mouth daily.  12/18/15  Yes Mast, Man X, NP  nitroGLYCERIN (NITROSTAT) 0.4 MG SL tablet DISSOLVE 1 TABLET UNDER THE TONGUE EVERY 5 MINUTES AS NEEDED FOR CHEST PAIN OR SHORTNESS OF BREATH Patient taking differently: Place 0.4 mg under the tongue every 5 (five) minutes as needed for chest pain.  12/18/15  Yes  Mast, Man X, NP  polyethylene glycol (MIRALAX / GLYCOLAX) packet Take 17 g by mouth daily as needed for mild constipation.    Yes [provider]    Physical Exam: Vitals:   10/06/18 0230 10/06/18 0300 10/06/18 0303 10/06/18 0315  BP: (!) 186/165 (!) 181/76 (!) 181/76 (!) 147/56  Pulse: 60 62 62 71  Resp: 16 (!) 21 16 20   Temp:      TempSrc:      SpO2: 99% 99% 100% 99%   General: Not in acute distress HEENT:       Eyes: PERRL, EOMI, no scleral icterus.       ENT: No discharge from the ears and nose, no pharynx injection, no tonsillar enlargement.        Neck: No JVD, no bruit, no mass felt. Heme: No  neck lymph node enlargement. Cardiac: S1/S2, RRR, No murmurs, No gallops or rubs. Respiratory:  No rales, wheezing, rhonchi or rubs. GI: Soft, nondistended, nontender, no rebound pain, no organomegaly, BS present. GU: No hematuria Ext: No pitting leg edema bilaterally. 2+DP/PT pulse bilaterally. Musculoskeletal: No joint deformities, No joint redness or warmth, no limitation of ROM in spin. Skin: No rashes. Has scalp hematoma Neuro: pt is drowsy, but is oriented X3, cranial nerves II-XII grossly intact, has left-sided weakness (left arm is worse than the left leg). psych: Patient is not psychotic, no suicidal or hemocidal ideation.  Labs on Admission: I have personally reviewed following labs and imaging studies I checked the record She has history of ESBL she also has history of bilateral hydronephrosis and a with a stent placed in October CBC: Recent Labs  Lab 10/06/18 0008 10/06/18 0240  WBC 5.4 7.8  NEUTROABS 3.5  --   HGB 12.8 12.4  HCT 42.4 40.3  MCV 91.2 91.0  PLT 224 213   Basic Metabolic Panel: Recent Labs  Lab 10/06/18 0008 10/06/18 0240  NA 141 141  K 4.3 4.2  CL 105 106  CO2 29 24  GLUCOSE 103* 109*  BUN 11 8  CREATININE 0.92 0.77  CALCIUM 9.4 9.3   GFR: CrCl cannot be calculated (Unknown ideal weight.). Liver Function Tests: Recent Labs    Lab 10/06/18 0008  AST 23  ALT 23  ALKPHOS 65  BILITOT 0.4  PROT 7.0  ALBUMIN 3.5   No results for input(s): LIPASE, AMYLASE in the last 168 hours. No results for input(s): AMMONIA in the last 168 hours. Coagulation Profile: Recent Labs  Lab 10/06/18 0008  INR 0.98   Cardiac Enzymes: Recent Labs  Lab 10/06/18 0240  CKTOTAL 67   BNP (last 3 results) No results for input(s): PROBNP in the last 8760 hours. HbA1C: No results for input(s): HGBA1C in the last 72 hours. CBG: No results for input(s): GLUCAP in the last 168 hours. Lipid Profile: No results for input(s): CHOL, HDL, LDLCALC, TRIG, CHOLHDL, LDLDIRECT in the last 72 hours. Thyroid Function Tests: No results for input(s): TSH, T4TOTAL, FREET4, T3FREE, THYROIDAB in the last 72 hours. Anemia Panel: No results for input(s): VITAMINB12, FOLATE, FERRITIN, TIBC, IRON, RETICCTPCT in the last 72 hours. Urine analysis:    Component Value Date/Time   COLORURINE YELLOW 10/06/2018 0040   APPEARANCEUR HAZY (A) 10/06/2018 0040   LABSPEC 1.017 10/06/2018 0040   PHURINE 6.0 10/06/2018 0040   GLUCOSEU NEGATIVE 10/06/2018 0040   HGBUR MODERATE (A) 10/06/2018 0040   BILIRUBINUR NEGATIVE 10/06/2018 0040   KETONESUR NEGATIVE 10/06/2018 0040   PROTEINUR NEGATIVE 10/06/2018 0040   UROBILINOGEN 1.0 04/02/2015 1043   NITRITE NEGATIVE 10/06/2018 0040   LEUKOCYTESUR NEGATIVE 10/06/2018 0040   Sepsis Labs: @LABRCNTIP (procalcitonin:4,lacticidven:4) )No results found for this or any previous visit (from the past 240 hour(s)).   Radiological Exams on Admission: Ct Head Wo Contrast  Result Date: 10/06/2018 CLINICAL DATA:  Status post fall, found on the floor. Seizure activity suspected. History of left hemiparesis. EXAM: CT HEAD WITHOUT CONTRAST CT CERVICAL SPINE WITHOUT CONTRAST TECHNIQUE: Multidetector CT imaging of the head and cervical spine was performed following the standard protocol without intravenous contrast. Multiplanar CT  image reconstructions of the cervical spine were also generated. COMPARISON:  07/14/2018 FINDINGS: CT HEAD FINDINGS Brain: No evidence of acute infarction, hemorrhage, extra-axial collection, ventriculomegaly, or mass effect. Old right MCA territory infarct with encephalomalacia and ex vacuo dilatation of the right lateral ventricle.  Generalized cerebral atrophy. Periventricular white matter low attenuation likely secondary to microangiopathy. Vascular: Cerebrovascular atherosclerotic calcifications are noted. Skull: Negative for fracture or focal lesion. Sinuses/Orbits: Visualized portions of the orbits are unremarkable. Visualized portions of the paranasal sinuses and mastoid air cells are unremarkable. Other: Large left parietal scalp hematoma. CT CERVICAL SPINE FINDINGS Alignment: Normal. Skull base and vertebrae: No acute fracture. No primary bone lesion or focal pathologic process. Soft tissues and spinal canal: No prevertebral fluid or swelling. No visible canal hematoma. Disc levels: Degenerative disc disease with disc height loss at C5-6 with a broad-based disc osteophyte complex, bilateral uncovertebral degenerative changes and bilateral foraminal stenosis, right greater than left. Small central disc protrusion at C2-3. Upper chest: Lung apices are clear. Other: No fluid collection or hematoma. IMPRESSION: 1. No acute intracranial pathology. 2.  No acute osseous injury of the cervical spine. 3. Mild cervical spine spondylosis as described above. 4. Large left parietal scalp hematoma. 5. Old right MCA territory infarct. Electronically Signed   By: Elige Ko   On: 10/06/2018 01:16   Ct Cervical Spine Wo Contrast  Result Date: 10/06/2018 CLINICAL DATA:  Status post fall, found on the floor. Seizure activity suspected. History of left hemiparesis. EXAM: CT HEAD WITHOUT CONTRAST CT CERVICAL SPINE WITHOUT CONTRAST TECHNIQUE: Multidetector CT imaging of the head and cervical spine was performed following  the standard protocol without intravenous contrast. Multiplanar CT image reconstructions of the cervical spine were also generated. COMPARISON:  07/14/2018 FINDINGS: CT HEAD FINDINGS Brain: No evidence of acute infarction, hemorrhage, extra-axial collection, ventriculomegaly, or mass effect. Old right MCA territory infarct with encephalomalacia and ex vacuo dilatation of the right lateral ventricle. Generalized cerebral atrophy. Periventricular white matter low attenuation likely secondary to microangiopathy. Vascular: Cerebrovascular atherosclerotic calcifications are noted. Skull: Negative for fracture or focal lesion. Sinuses/Orbits: Visualized portions of the orbits are unremarkable. Visualized portions of the paranasal sinuses and mastoid air cells are unremarkable. Other: Large left parietal scalp hematoma. CT CERVICAL SPINE FINDINGS Alignment: Normal. Skull base and vertebrae: No acute fracture. No primary bone lesion or focal pathologic process. Soft tissues and spinal canal: No prevertebral fluid or swelling. No visible canal hematoma. Disc levels: Degenerative disc disease with disc height loss at C5-6 with a broad-based disc osteophyte complex, bilateral uncovertebral degenerative changes and bilateral foraminal stenosis, right greater than left. Small central disc protrusion at C2-3. Upper chest: Lung apices are clear. Other: No fluid collection or hematoma. IMPRESSION: 1. No acute intracranial pathology. 2.  No acute osseous injury of the cervical spine. 3. Mild cervical spine spondylosis as described above. 4. Large left parietal scalp hematoma. 5. Old right MCA territory infarct. Electronically Signed   By: Elige Ko   On: 10/06/2018 01:16     EKG: Independently reviewed.  Sinus rhythm, QTC 438, mild ST depression in the inferior leads, mild T wave elevation in V4-V6, anteroseptal infarction pattern.   Assessment/Plan Principal Problem:   Fall Active Problems:   Seizure disorder (HCC)    HTN (hypertension)   History of stroke   HLD (hyperlipidemia)   CAD (coronary artery disease)   Fall and possible seizure: Patient's daughter is a suspect patient may have had seizure though not 100% for sure.  Patient did not have seizure in the ED.  CT head is negative for acute intracranial abnormalities.  Patient is taking Lamictal at home. Will check Lamictal level to make sure it is therapeutic.  -Placed on telemetry bed for observation -Frequent neuro check -Continue Lamictal -  Check Lamictal level -Seizure precaution -When necessary Ativan for seizure -if pt develops seizure again and Lamictal level is therapeutic, may need to consult neurology.  Seizure disorder (HCC): -see above  HTN:  -Continue home medications: Lisinopril, metoprolol -IV hydralazine prn  History of stroke: -continue Lipitor and aspirin -Hold Plavix due to scalp hematoma  HLD (hyperlipidemia): -Lipitor  History of  CAD (coronary artery disease): No chest pain. -Continue aspirin, Lipitor and metoprolol -PRN nitroglycerin   DVT ppx: SCD Code Status: Full code (I discussed with the patient in the presence of her daughter, patient wants to be full code). Family Communication:   Yes, patient's daughter    at bed side Disposition Plan:  Anticipate discharge back to previous home environment Consults called:  none Admission status: Obs / tele    Date of Service 10/06/2018    Lorretta HarpXilin Mry Lamia [facility]   If 7PM-7AM, please contact night-coverage [email or web address] Password [email or web address] 10/06/2018, 3:49 AM

## 2018-10-06 NOTE — Care Management Note (Signed)
Case Management Note  Patient Details  Name: [name] [name] MRN: [id number] Date of Birth: August 03, 1941  Subjective/Objective:   Pt in with a fall. She is from home with her daughter that works.  DME: transfer chair, cane, walker, shower chair, 3 in 1 No issue obtaining home meds.  Daughter provides needed transportation.                 Action/Plan: Awaiting PT/OT recommendations. CM following for d/c needs, physician orders.   Expected Discharge Date:                  Expected Discharge Plan:     In-House Referral:     Discharge planning Services     Post Acute Care Choice:    Choice offered to:     DME Arranged:    DME Agency:     HH Arranged:    HH Agency:     Status of Service:  In process, will continue to follow  If discussed at [place] Length of Stay Meetings, dates discussed:    Additional Comments:  Kermit BaloKelli F Marshal Eskew, RN 10/06/2018, 4:08 PM

## 2018-10-06 NOTE — ED Provider Notes (Signed)
[facility] Emergency Department Provider Note MRN:  [id number]  Arrival date & time: 10/06/18     Chief Complaint   Fall (? Seizure)   History of Present Illness   [name] [name] is a 78 y.o. year-old female with a history of stroke, seizure disorder, CAD presenting to the ED with chief complaint of fall.  Patient was in the bathroom getting ready for bed and does not remember what happened.  Daughter heard her fall from the other room.  When daughter came to investigate patient was having her normal seizure activity, lipsmacking, grunting, eyes deviating, unresponsive for about 3 minutes, groggy afterwards.  Denies recent fever, denies any pain or injuries from the fall, unsure if she had head trauma.  Review of Systems  A complete 10 system review of systems was obtained and all systems are negative except as noted in the HPI and PMH.   Patient's Health History    Past Medical History:  Diagnosis Date  . CAD (coronary artery disease) 1997    with CABG  . Cervical spine fracture [facility](HCC)    Not requiring surgery  . Chest pain   . Constipation   . Foot drop, left   . Gait disturbance    quad cane  . Headache(784.0) 03/14/2013  . Heart attack (HCC)   . Hemiparesis and alteration of sensations as late effects of stroke (HCC) 03/14/2013   left  . Hypercholesterolemia   . Hypertension   . Osteoporosis   . Palpitations   . Periorbital hematoma of left eye   . Peripheral vascular disease (HCC)    Left superficial femoral artery  . Peroneal DVT (deep venous thrombosis)   . Post-menopausal   . PVD (peripheral vascular disease) (HCC)   . Seizures (HCC)   . Stroke (HCC)   . Stroke (HCC) 2010  . Urinary incontinence     Past Surgical History:  Procedure Laterality Date  . CATARACT EXTRACTION    . CORONARY ARTERY BYPASS GRAFT     x3    Family History  Problem Relation Age of Onset  . Heart attack Mother   . Heart attack Father   . Heart attack Sister   .  Heart attack Brother     Social History   Socioeconomic History  . Marital status: Widowed    Spouse name: Not on file  . Number of children: 5  . Years of education: 10  . Highest education level: Not on file  Occupational History  . Occupation: Retired  Engineer, productionocial Needs  . Financial resource strain: Not on file  . Food insecurity:    Worry: Not on file    Inability: Not on file  . Transportation needs:    Medical: Not on file    Non-medical: Not on file  Tobacco Use  . Smoking status: Never Smoker  . Smokeless tobacco: Never Used  Substance and Sexual Activity  . Alcohol use: No  . Drug use: No  . Sexual activity: Not on file  Lifestyle  . Physical activity:    Days per week: Not on file    Minutes per session: Not on file  . Stress: Not on file  Relationships  . Social connections:    Talks on phone: Not on file    Gets together: Not on file    Attends religious service: Not on file    Active member of club or organization: Not on file    Attends meetings of clubs or  organizations: Not on file    Relationship status: Not on file  . Intimate partner violence:    Fear of current or ex partner: Not on file    Emotionally abused: Not on file    Physically abused: Not on file    Forced sexual activity: Not on file  Other Topics Concern  . Not on file  Social History Narrative   Patient is a widowed and lives with her daughter [name] [name]).   Patient has three living children and two are deceased.   Patient is retired.   Patient has a 10th grade education.   Patient is right-handed.   Patient does not drink caffeine.        Physical Exam  Vital Signs and Nursing Notes reviewed Vitals:   10/06/18 0315 10/06/18 0357  BP: (!) 147/56 (!) 124/54  Pulse: 71 65  Resp: 20 20  Temp:  97.8 F (36.6 C)  SpO2: 99% 100%    CONSTITUTIONAL: Well-appearing, NAD NEURO:  Alert and oriented x 3, left hemiparesis EYES:  eyes equal and reactive ENT/NECK:  no LAD, no  JVD CARDIO: Regular rate, well-perfused, normal S1 and S2 PULM:  CTAB no wheezing or rhonchi GI/GU:  normal bowel sounds, non-distended, non-tender MSK/SPINE:  No gross deformities, no edema SKIN:  no rash, atraumatic PSYCH:  Appropriate speech and behavior  Diagnostic and Interventional Summary    EKG Interpretation  Date/Time:  Wednesday October 05 2018 23:47:19 EST Ventricular Rate:  71 PR Interval:    QRS Duration: 97 QT Interval:  403 QTC Calculation: 438 R Axis:   57 Text Interpretation:  Sinus rhythm LVH with secondary repolarization abnormality Anterior Q waves, possibly due to LVH Confirmed by Kennis Carina [phone]) on 10/06/2018 12:13:29 AM      Labs Reviewed  COMPREHENSIVE METABOLIC PANEL - Abnormal; Notable for the following components:      Result Value   Glucose, Bld 103 (*)    All other components within normal limits  URINALYSIS, ROUTINE W REFLEX MICROSCOPIC - Abnormal; Notable for the following components:   APPearance HAZY (*)    Hgb urine dipstick MODERATE (*)    Bacteria, UA FEW (*)    All other components within normal limits  BASIC METABOLIC PANEL - Abnormal; Notable for the following components:   Glucose, Bld 109 (*)    All other components within normal limits  CBC WITH DIFFERENTIAL/PLATELET  PROTIME-INR  CK  CBC  LAMOTRIGINE LEVEL    CT HEAD WO CONTRAST  Final Result    CT CERVICAL SPINE WO CONTRAST  Final Result      Medications  LORazepam (ATIVAN) injection 1 mg (1 mg Intravenous Given 10/06/18 0217)  aspirin EC tablet 81 mg (has no administration in time range)  atorvastatin (LIPITOR) tablet 20 mg (has no administration in time range)  lisinopril (PRINIVIL,ZESTRIL) tablet 20 mg (has no administration in time range)  metoprolol succinate (TOPROL-XL) 24 hr tablet 50 mg (has no administration in time range)  nitroGLYCERIN (NITROSTAT) SL tablet 0.4 mg (has no administration in time range)  polyethylene glycol (MIRALAX / GLYCOLAX) packet 17  g (has no administration in time range)  lamoTRIgine (LAMICTAL) tablet 100 mg (100 mg Oral Given 10/06/18 0410)  cholecalciferol (VITAMIN D3) tablet 1,000 Units (has no administration in time range)  hydrALAZINE (APRESOLINE) injection 5 mg (has no administration in time range)  acetaminophen (TYLENOL) tablet 650 mg (has no administration in time range)    Or  acetaminophen (TYLENOL) suppository 650 mg (has  no administration in time range)  ondansetron (ZOFRAN) tablet 4 mg (has no administration in time range)    Or  ondansetron (ZOFRAN) injection 4 mg (has no administration in time range)  hydrALAZINE (APRESOLINE) injection 10 mg (10 mg Intravenous Given 10/06/18 0306)     Procedures Critical Care  ED Course and Medical Decision Making  I have reviewed the triage vital signs and the nursing notes.  Pertinent labs & imaging results that were available during my care of the patient were reviewed by me and considered in my medical decision making (see below for details).  Unwitnessed ground-level fall, typical seizure activity and is 78 year old female with history of stroke, left-sided hemiplegia, seizure disorder as a complication of her stroke years ago.  Patient is on blood thinners, CT of the head and C-spine pending, will evaluate with labs and urine to exclude secondary cause of seizure.  Work-up largely unrevealing, labs unremarkable, urinalysis unremarkable.  CT with no ICH, no cervical spinal injury, large parietal scalp hematoma.  Given patient's Plavix use, she was admitted to the hospital service for observation given the possibility of delayed bleed.  Elmer Sow. Pilar Plate, MD [facility] Emergency Medicine [facility] [email or web address]  Final Clinical Impressions(s) / ED Diagnoses     ICD-10-CM   1. Hematoma of scalp, initial encounter S00.03XA   2. Anticoagulated Z79.01   3. Seizures [facility]) R56.9     ED Discharge Orders    None         Sabas Sous,  MD 10/06/18 [phone]

## 2018-10-07 ENCOUNTER — Observation Stay (HOSPITAL_BASED_OUTPATIENT_CLINIC_OR_DEPARTMENT_OTHER): Payer: Medicare PPO

## 2018-10-07 DIAGNOSIS — I10 Essential (primary) hypertension: Secondary | ICD-10-CM | POA: Diagnosis not present

## 2018-10-07 DIAGNOSIS — R55 Syncope and collapse: Secondary | ICD-10-CM

## 2018-10-07 DIAGNOSIS — E785 Hyperlipidemia, unspecified: Secondary | ICD-10-CM | POA: Diagnosis not present

## 2018-10-07 DIAGNOSIS — W19XXXA Unspecified fall, initial encounter: Secondary | ICD-10-CM | POA: Diagnosis not present

## 2018-10-07 DIAGNOSIS — Z8673 Personal history of transient ischemic attack (TIA), and cerebral infarction without residual deficits: Secondary | ICD-10-CM | POA: Diagnosis not present

## 2018-10-07 LAB — ECHOCARDIOGRAM COMPLETE

## 2018-10-07 MED ORDER — HYDRALAZINE HCL 20 MG/ML IJ SOLN: 10.0000 mg | Freq: Four times a day (QID) | INTRAMUSCULAR | Status: DC | PRN

## 2018-10-07 MED ORDER — SENNOSIDES-DOCUSATE SODIUM 8.6-50 MG PO TABS
2.0000 | ORAL_TABLET | Freq: Every day | ORAL | Status: DC
  Administered 2018-10-07: 2 via ORAL
  Filled 2018-10-07: qty 2

## 2018-10-07 NOTE — Progress Notes (Signed)
  Echocardiogram 2D Echocardiogram has been performed.  [name] [name] 10/07/2018, 9:52 AM

## 2018-10-07 NOTE — NC FL2 (Signed)
[place] MEDICAID FL2 LEVEL OF CARE SCREENING TOOL     IDENTIFICATION  Patient Name: [name] [name] Birthdate: 1941/04/26 Sex: female Admission Date (Current Location): 10/05/2018  [facility] and [place] Number:  Producer, television/film/video and Address:  [facility] [place], [address], [place], [place] [address]      Provider Number: [id number]  Attending Physician Name and Address:  Shon Hale, MD  Relative Name and Phone Number:       Current Level of Care: Hospital Recommended Level of Care: Skilled Nursing Facility Prior Approval Number:    Date Approved/Denied:   PASRR Number: [id number]  Discharge Plan: SNF    Current Diagnoses: Patient Active Problem List   Diagnosis Date Noted  . Fall 10/06/2018  . HLD (hyperlipidemia) 10/06/2018  . CAD (coronary artery disease) 10/06/2018  . History of stroke 03/16/2017  . Urinary incontinence   . Periorbital hematoma of left eye   . Gait disturbance   . Foot drop, left   . Constipation 12/05/2015  . Chest pain 12/01/2015  . Fall at home 12/01/2015  . HTN (hypertension) 12/01/2015  . Common bile duct dilatation 12/01/2015  . Compression fracture of L1 lumbar vertebra (HCC) 12/01/2015  . Abnormality of gait 11/15/2015  . Seizure disorder (HCC) 11/08/2013  . Hemiparesis and alteration of sensations as late effects of stroke (HCC) 03/14/2013  . Headache 03/14/2013    Orientation RESPIRATION BLADDER Height & Weight     Self, Time, Situation, Place    Continent Weight:   Height:     BEHAVIORAL SYMPTOMS/MOOD NEUROLOGICAL BOWEL NUTRITION STATUS  (None) Convulsions/Seizures Continent Diet(Heart healthy)  AMBULATORY STATUS COMMUNICATION OF NEEDS Skin   Extensive Assist Verbally Normal                       Personal Care Assistance Level of Assistance  Bathing, Feeding, Dressing Bathing Assistance: Limited assistance Feeding assistance: Limited assistance Dressing Assistance: Limited assistance     Functional Limitations Info  Sight, Hearing, Speech Sight Info: Adequate Hearing Info: Adequate Speech Info: Adequate    SPECIAL CARE FACTORS FREQUENCY  PT (By licensed PT), Blood pressure, OT (By licensed OT)     PT Frequency: 5 x week OT Frequency: 5 x week            Contractures Contractures Info: Not present    Additional Factors Info  Code Status, Allergies Code Status Info: Full code Allergies Info: Latex, Penicillins, Tape.           Current Medications (10/07/2018):  This is the current hospital active medication list Current Facility-Administered Medications  Medication Dose Route Frequency Provider Last Rate Last Dose  . acetaminophen (TYLENOL) tablet 650 mg  650 mg Oral Q6H PRN Lorretta Harp, MD   650 mg at 10/06/18 2024   Or  . acetaminophen (TYLENOL) suppository 650 mg  650 mg Rectal Q6H PRN Lorretta Harp, MD      . aspirin EC tablet 81 mg  81 mg Oral Daily Lorretta Harp, MD   81 mg at 10/07/18 1020  . atorvastatin (LIPITOR) tablet 20 mg  20 mg Oral QPM Lorretta Harp, MD   20 mg at 10/06/18 1747  . bisacodyl (DULCOLAX) suppository 10 mg  10 mg Rectal Daily PRN Emokpae, Courage, MD      . cholecalciferol (VITAMIN D3) tablet 1,000 Units  1,000 Units Oral Daily Lorretta Harp, MD   1,000 Units at 10/07/18 1020  . hydrALAZINE (APRESOLINE) injection  5 mg  5 mg Intravenous Q2H PRN Lorretta HarpNiu, Xilin, MD      . lamoTRIgine (LAMICTAL) tablet 100 mg  100 mg Oral BID Lorretta HarpNiu, Xilin, MD   100 mg at 10/07/18 1020  . lisinopril (PRINIVIL,ZESTRIL) tablet 20 mg  20 mg Oral QHS Lorretta HarpNiu, Xilin, MD   20 mg at 10/06/18 2112  . LORazepam (ATIVAN) injection 1 mg  1 mg Intravenous Q2H PRN Lorretta HarpNiu, Xilin, MD   1 mg at 10/06/18 0217  . metoprolol succinate (TOPROL-XL) 24 hr tablet 50 mg  50 mg Oral Daily Lorretta HarpNiu, Xilin, MD   50 mg at 10/07/18 1020  . nitroGLYCERIN (NITROSTAT) SL tablet 0.4 mg  0.4 mg Sublingual Q5 min PRN Lorretta HarpNiu, Xilin, MD      . ondansetron [facility](ZOFRAN) tablet 4 mg  4 mg Oral Q6H PRN Lorretta HarpNiu, Xilin, MD       Or   . ondansetron (ZOFRAN) injection 4 mg  4 mg Intravenous Q6H PRN Lorretta HarpNiu, Xilin, MD      . polyethylene glycol (MIRALAX / GLYCOLAX) packet 17 g  17 g Oral BID Shon HaleEmokpae, Courage, MD   17 g at 10/07/18 1020     Discharge Medications: Please see discharge summary for a list of discharge medications.  Relevant Imaging Results:  Relevant Lab Results:   Additional Information SS#: [id number]  Margarito LinerSarah C Jacqlyn Marolf, LCSW

## 2018-10-07 NOTE — Care Management Obs Status (Signed)
MEDICARE OBSERVATION STATUS NOTIFICATION   Patient Details  Name: [name] [name] MRN: [id number] Date of Birth: 08-10-1941   Medicare Observation Status Notification Given:  Yes(Pt deferred to daughter - CM read notice via phone with daughter )    Cherylann Parr, RN 10/07/2018, 2:14 PM

## 2018-10-07 NOTE — Care Management Note (Addendum)
Case Management Note Previous note created by Fabio Neighbors  Patient Details  Name: [name] [name] MRN: [id number] Date of Birth: 11/24/1940  Subjective/Objective:   Pt in with a fall. She is from home with her daughter that works.  DME: transfer chair, cane, walker, shower chair, 3 in 1 No issue obtaining home meds.  Daughter provides needed transportation.                 Action/Plan: Awaiting PT/OT recommendations. CM following for d/c needs, physician orders.   Expected Discharge Date:                  Expected Discharge Plan:  Skilled Nursing Facility  In-House Referral:  Clinical Social Work  Discharge planning Services  CM Consult  Post Acute Care Choice:    Choice offered to:     DME Arranged:    DME Agency:     HH Arranged:    HH Agency:     Status of Service:  In process, will continue to follow  If discussed at [place] Length of Stay Meetings, dates discussed:    Additional Comments: 10/07/2018 CM had detailed conversation with both pt and daughter regarding CIR/SNF and HH.  Both pt and daughter voiced concerns with pt returning home at discharge with only HH (daughter works and pt will be left alone during those time) - both are in agreement for CSW to fax pt out to SNF facilites as CIR has been deemed inappropriate.  CM also left medicare.gov HH list at bedside for pt and daughter to review in case SNF is not chosen by pt/family or if it is declined by insurance. Cherylann Parr, RN 10/07/2018, 2:32 PM

## 2018-10-07 NOTE — Progress Notes (Signed)
Physical Therapy Treatment Patient Details Name: [name] [name] MRN: [id number] DOB: 1941-01-04 Today's Date: 10/07/2018    History of Present Illness Pt is a 78 y/o female with past medical history significant of hypertension, hyperlipidemia, stroke with left-sided weakness, CAD, CABG, PVD, DVT, seizure, who presents with fall and possible seizure.    PT Comments    Pt continues to require heavy physical assistance of two for transfers; however, she did make improvements in her bed mobility and sitting balance this session. She maintained upright midline sitting position independently ~75% of the time. Pt would continue to benefit from skilled physical therapy services at this time while admitted and after d/c to address the below listed limitations in order to improve overall safety and independence with functional mobility.    Follow Up Recommendations  CIR     Equipment Recommendations  None recommended by PT    Recommendations for Other Services       Precautions / Restrictions Precautions Precautions: Fall Precaution Comments: L sided weakness from prior CVA Restrictions Weight Bearing Restrictions: No    Mobility  Bed Mobility Overal bed mobility: Needs Assistance Bed Mobility: Supine to Sit;Sit to Supine     Supine to sit: Min guard Sit to supine: Min guard   General bed mobility comments: min guard for safety, increased time and effort  Transfers Overall transfer level: Needs assistance Equipment used: 2 person hand held assist Transfers: Sit to/from [employer] Sit to Stand: Mod assist;+2 physical assistance Stand pivot transfers: Max assist;+2 physical assistance       General transfer comment: pt performed sit<>stand x1 from EOB and x1 from [facility]; more assistance needed with pivotal movements; pt pivoting towards R side to [facility] and then towards L side back to bed  Ambulation/Gait                 Stairs             Wheelchair  Mobility    Modified Rankin (Stroke Patients Only) Modified Rankin (Stroke Patients Only) Pre-Morbid Rankin Score: Moderately severe disability Modified Rankin: Moderately severe disability     Balance Overall balance assessment: Needs assistance Sitting-balance support: Feet supported Sitting balance-Leahy Scale: Fair     Standing balance support: Bilateral upper extremity supported Standing balance-Leahy Scale: Poor                              Cognition Arousal/Alertness: Awake/alert Behavior During Therapy: WFL for tasks assessed/performed Overall Cognitive Status: Impaired/Different from baseline Area of Impairment: Memory;Following commands;Safety/judgement;Problem solving                     Memory: Decreased short-term memory Following Commands: Follows one step commands consistently;Follows multi-step commands inconsistently Safety/Judgement: Decreased awareness of safety;Decreased awareness of deficits   Problem Solving: Slow processing;Decreased initiation;Difficulty sequencing;Requires verbal cues;Requires tactile cues General Comments: pt repeatly mentioning concern with transfers due to the size of therapist. pt expressing feeling more confident with transfer from female RN previous day. Pt fixated on returning to supine to sleep       Exercises      General Comments        Pertinent Vitals/Pain Pain Assessment: No/denies pain    Home Living Family/patient expects to be discharged to:: Private residence Living Arrangements: Children Available Help at Discharge: Family;Available PRN/intermittently Type of Home: House Home Access: Stairs to enter Entrance Stairs-Rails: Right Home Layout: One level Home  Equipment: Cane - quad;Shower seat Additional Comments: pt reports previous splint in hand but not using it after leaving SNF in [place]    Prior Function Level of Independence: Needs assistance  Gait / Transfers Assistance Needed:  ambulates with use of a cane ADL's / Homemaking Assistance Needed: requires assistance from daughter with bathing Comments: pt noted to have Devery nails and reports daughter tells her to cut them but pt states "they make my hands look nice so i dont "   PT Goals (current goals can now be found in the care plan section) Acute Rehab PT Goals Patient Stated Goal: to take a nap PT Goal Formulation: With patient Time For Goal Achievement: 10/20/18 Potential to Achieve Goals: Good Progress towards PT goals: Progressing toward goals    Frequency    Min 3X/week      PT Plan Current plan remains appropriate    Co-evaluation PT/OT/SLP Co-Evaluation/Treatment: Yes Reason for Co-Treatment: For patient/therapist safety;To address functional/ADL transfers PT goals addressed during session: Mobility/safety with mobility;Balance;Strengthening/ROM OT goals addressed during session: ADL's and self-care;Proper use of Adaptive equipment and DME;Strengthening/ROM      AM-PAC PT "6 Clicks" Mobility   Outcome Measure  Help needed turning from your back to your side while in a flat bed without using bedrails?: A Little Help needed moving from lying on your back to sitting on the side of a flat bed without using bedrails?: A Little Help needed moving to and from a bed to a chair (including a wheelchair)?: A Lot Help needed standing up from a chair using your arms (e.g., wheelchair or bedside chair)?: A Lot Help needed to walk in hospital room?: A Lot Help needed climbing 3-5 steps with a railing? : Total 6 Click Score: 13    End of Session Equipment Utilized During Treatment: Gait belt Activity Tolerance: Patient tolerated treatment well Patient left: in bed;with call bell/phone within reach;with bed alarm set Nurse Communication: Mobility status PT Visit Diagnosis: Other abnormalities of gait and mobility (R26.89)     Time: 6808-8110 PT Time Calculation (min) (ACUTE ONLY): 25 min  Charges:   $Therapeutic Activity: 8-22 mins                     Deborah Chalk, PT, DPT  Acute Rehabilitation Services Pager [phone] Office [phone]     Alessandra Bevels [name] [name] 10/07/2018, 10:57 AM

## 2018-10-07 NOTE — Progress Notes (Signed)
Patient Demographics:    [name] [name], is a 78 y.o. female, DOB - 10/15/1940, XAJ:[id number]  Admit date - 10/05/2018   Admitting Physician [name] Harp, MD  Outpatient Primary MD for the patient is [name] James, MD  LOS - 0   Chief Complaint  Patient presents with  . Fall    ? Seizure        Subjective:    [name] [name] today has no fevers, no emesis,  No chest pain, had BM after Dulcolax suppository and MiraLAX, no headaches, no dizziness  Assessment  & Plan :    Principal Problem:   Fall Active Problems:   Seizure disorder (HCC)   HTN (hypertension)   History of stroke   HLD (hyperlipidemia)   CAD (coronary artery disease)  Brief summary 78 year old female with past medical history relevant for prior CVA in 2010 with residual left-sided hemiparesis, history of CAD with prior CABG, history of seizures, history of DVT and PVD and hypertension admitted on 10/06/2018 after a fall at home with concerns about syncope versus seizures   Plan:-  1)S/p Fall--- ---patient now with generalized weakness, difficulties with ADLs, PT eval and OT eval noted, recommend rehab, social work helping with possible placement to SNF rehab, at baseline patient does have left-sided hemiparesis from previous stroke  2)H/o SZ--- patient had a fall at home prior to admission, raising concerns about possible seizures, seizures with no weakness, CKs are not elevated, serum Lamictal levels are pending, continue Lamictal at this time  3) status post fall at home--- ??  Syncope, echo without significant new acute findings, EF is 60 to 65% with diastolic dysfunction, no significant valvular abnormalities specifically no significant aortic stenosis  4)Constipation----patient had bowel movement after Dulcolax supp, continue Miralax during the day and Senokot at at bedtime  5)H/o CVA--- patient had a stroke in 2010 with residual  left-sided hemiparesis, continue aspirin 81 mg, continue Lipitor 20 mg daily, plan is for SNF rehab  6)HTN--- stable, continue lisinopril 20 mg daily, metoprolol XL 50 mg daily  7) history of CAD/status post prior CABG--- stable, no ACS type symptoms, continue aspirin, Lipitor metoprolol, Plavix on hold due to scalp hematoma/closed head injury   Disposition/Need for in-Hospital Stay- patient unable to be discharged at this time due to awaiting insurance approval for transfer to SNF rehab due to falls at home with scalp hematoma in a patient with generalized weakness as well as left-sided hemiplegia patient is at increased risk for falls and injury,  Code Status : Full   Family Communication:   Daughter [name] [name]   Disposition Plan  : SNF Rehabe  Consults  :  PT/OT/SW  DVT Prophylaxis  :   - SCDs  (scalp hematoma)  Lab Results  Component Value Date   PLT 213 10/06/2018    Inpatient Medications  Scheduled Meds: . aspirin EC  81 mg Oral Daily  . atorvastatin  20 mg Oral QPM  . cholecalciferol  1,000 Units Oral Daily  . lamoTRIgine  100 mg Oral BID  . lisinopril  20 mg Oral QHS  . metoprolol succinate  50 mg Oral Daily  . polyethylene glycol  17 g Oral BID  . senna-docusate  2 tablet Oral QHS   Continuous  Infusions: PRN Meds:.acetaminophen **OR** acetaminophen, bisacodyl, hydrALAZINE, LORazepam, nitroGLYCERIN, ondansetron **OR** ondansetron (ZOFRAN) IV    Anti-infectives (From admission, onward)   None        Objective:   Vitals:   10/07/18 0408 10/07/18 0746 10/07/18 1212 10/07/18 1536  BP: (!) 149/64 (!) 159/71 (!) 153/61 (!) 151/70  Pulse: 64 65 (!) 58 (!) 57  Resp: 17 20 17 17   Temp: 97.8 F (36.6 C) 97.9 F (36.6 C) 97.9 F (36.6 C) 98.1 F (36.7 C)  TempSrc: Oral Oral Oral Oral  SpO2: 100% 100% 100% 100%    Wt Readings from Last 3 Encounters:  07/14/18 77.1 kg  06/29/18 77.1 kg  03/16/17 77.3 kg     Intake/Output Summary (Last 24 hours) at  10/07/2018 1549 Last data filed at 10/06/2018 2100 Gross per 24 hour  Intake 800 ml  Output -  Net 800 ml     Physical Exam Patient is examined daily including today on 10/07/18 , exams remain the same as of yesterday except that has changed   Gen:- Awake Alert,  In no apparent distress  HEENT:-Scalp hematoma, no sclera icterus Neck-Supple Neck,No JVD,.  Lungs-  CTAB , fair symmetrical air movement CV- S1, S2 normal, regular  Abd-  +ve B.Sounds, Abd Soft, No tenderness,    Extremity/Skin:- No  edema, pedal pulses present  Psych-affect is appropriate, oriented x3 Neuro-patient with residual left LE paresis and left upper extremity plegia  which are not new , no tremors   Data Review:   Micro Results No results found for this or any previous visit (from the past 240 hour(s)).  Radiology Reports Ct Head Wo Contrast  Result Date: 10/06/2018 CLINICAL DATA:  Status post fall, found on the floor. Seizure activity suspected. History of left hemiparesis. EXAM: CT HEAD WITHOUT CONTRAST CT CERVICAL SPINE WITHOUT CONTRAST TECHNIQUE: Multidetector CT imaging of the head and cervical spine was performed following the standard protocol without intravenous contrast. Multiplanar CT image reconstructions of the cervical spine were also generated. COMPARISON:  07/14/2018 FINDINGS: CT HEAD FINDINGS Brain: No evidence of acute infarction, hemorrhage, extra-axial collection, ventriculomegaly, or mass effect. Old right MCA territory infarct with encephalomalacia and ex vacuo dilatation of the right lateral ventricle. Generalized cerebral atrophy. Periventricular white matter low attenuation likely secondary to microangiopathy. Vascular: Cerebrovascular atherosclerotic calcifications are noted. Skull: Negative for fracture or focal lesion. Sinuses/Orbits: Visualized portions of the orbits are unremarkable. Visualized portions of the paranasal sinuses and mastoid air cells are unremarkable. Other: Large left parietal  scalp hematoma. CT CERVICAL SPINE FINDINGS Alignment: Normal. Skull base and vertebrae: No acute fracture. No primary bone lesion or focal pathologic process. Soft tissues and spinal canal: No prevertebral fluid or swelling. No visible canal hematoma. Disc levels: Degenerative disc disease with disc height loss at C5-6 with a broad-based disc osteophyte complex, bilateral uncovertebral degenerative changes and bilateral foraminal stenosis, right greater than left. Small central disc protrusion at C2-3. Upper chest: Lung apices are clear. Other: No fluid collection or hematoma. IMPRESSION: 1. No acute intracranial pathology. 2.  No acute osseous injury of the cervical spine. 3. Mild cervical spine spondylosis as described above. 4. Large left parietal scalp hematoma. 5. Old right MCA territory infarct. Electronically Signed   By: Elige KoHetal  Patel   On: 10/06/2018 01:16   Ct Cervical Spine Wo Contrast  Result Date: 10/06/2018 CLINICAL DATA:  Status post fall, found on the floor. Seizure activity suspected. History of left hemiparesis. EXAM: CT HEAD WITHOUT CONTRAST CT  CERVICAL SPINE WITHOUT CONTRAST TECHNIQUE: Multidetector CT imaging of the head and cervical spine was performed following the standard protocol without intravenous contrast. Multiplanar CT image reconstructions of the cervical spine were also generated. COMPARISON:  07/14/2018 FINDINGS: CT HEAD FINDINGS Brain: No evidence of acute infarction, hemorrhage, extra-axial collection, ventriculomegaly, or mass effect. Old right MCA territory infarct with encephalomalacia and ex vacuo dilatation of the right lateral ventricle. Generalized cerebral atrophy. Periventricular white matter low attenuation likely secondary to microangiopathy. Vascular: Cerebrovascular atherosclerotic calcifications are noted. Skull: Negative for fracture or focal lesion. Sinuses/Orbits: Visualized portions of the orbits are unremarkable. Visualized portions of the paranasal sinuses  and mastoid air cells are unremarkable. Other: Large left parietal scalp hematoma. CT CERVICAL SPINE FINDINGS Alignment: Normal. Skull base and vertebrae: No acute fracture. No primary bone lesion or focal pathologic process. Soft tissues and spinal canal: No prevertebral fluid or swelling. No visible canal hematoma. Disc levels: Degenerative disc disease with disc height loss at C5-6 with a broad-based disc osteophyte complex, bilateral uncovertebral degenerative changes and bilateral foraminal stenosis, right greater than left. Small central disc protrusion at C2-3. Upper chest: Lung apices are clear. Other: No fluid collection or hematoma. IMPRESSION: 1. No acute intracranial pathology. 2.  No acute osseous injury of the cervical spine. 3. Mild cervical spine spondylosis as described above. 4. Large left parietal scalp hematoma. 5. Old right MCA territory infarct. Electronically Signed   By: Elige Ko   On: 10/06/2018 01:16     CBC Recent Labs  Lab 10/06/18 0008 10/06/18 0240  WBC 5.4 7.8  HGB 12.8 12.4  HCT 42.4 40.3  PLT 224 213  MCV 91.2 91.0  MCH 27.5 28.0  MCHC 30.2 30.8  RDW 12.3 12.2  LYMPHSABS 1.5  --   MONOABS 0.4  --   EOSABS 0.0  --   BASOSABS 0.0  --     Chemistries  Recent Labs  Lab 10/06/18 0008 10/06/18 0240  NA 141 141  K 4.3 4.2  CL 105 106  CO2 29 24  GLUCOSE 103* 109*  BUN 11 8  CREATININE 0.92 0.77  CALCIUM 9.4 9.3  AST 23  --   ALT 23  --   ALKPHOS 65  --   BILITOT 0.4  --    Coagulation profile Recent Labs  Lab 10/06/18 0008  INR 0.98   ----------------------------------------------------------------------------------------------------------------- No results found for: BNP   Shon Hale M.D on 10/07/2018 at 3:49 PM  Go to [email or web address] - for contact info  [facility] - Office  [phone]

## 2018-10-07 NOTE — Evaluation (Signed)
Occupational Therapy Evaluation Patient Details Name: [name] [name] MRN: [id number] DOB: Mar 20, 1941 Today's Date: 10/07/2018    History of Present Illness Pt is a 78 y/o female with past medical history significant of hypertension, hyperlipidemia, stroke with left-sided weakness, CAD, CABG, PVD, DVT, seizure, who presents with fall and possible seizure.   Clinical Impression   PT admitted with s/p fall with pending workup for possible seizure.. Pt currently with functional limitiations due to the deficits listed below (see OT problem list). Pt currently requires total +2 mod (A) for stand pivot. Pt likely to refuse SNF placement and will need to demonstrate transfers with family MoD I together.  Pt will benefit from skilled OT to increase their independence and safety with adls and balance to allow discharge SNF.      Follow Up Recommendations  SNF    Equipment Recommendations  None recommended by OT    Recommendations for Other Services       Precautions / Restrictions Precautions Precautions: Fall Precaution Comments: L sided weakness from prior CVA Restrictions Weight Bearing Restrictions: No      Mobility Bed Mobility Overal bed mobility: Needs Assistance Bed Mobility: Supine to Sit     Supine to sit: Min assist     General bed mobility comments: pt able to pivot buttock toward the L side using R LE to hook the eob to (A with trunk stability. pt requires min (A) to advance to full upright posture   Transfers Overall transfer level: Needs assistance Equipment used: 2 person hand held assist Transfers: Sit to/from [employer] Sit to Stand: Mod assist;+2 physical assistance Stand pivot transfers: Max assist;+2 physical assistance       General transfer comment: pt performed sit<>stand x1 from EOB and x1 from [facility]; more assistance needed with pivotal movements; pt pivoting towards R side    Balance Overall balance assessment: Needs  assistance Sitting-balance support: Feet supported Sitting balance-Leahy Scale: Fair     Standing balance support: Bilateral upper extremity supported Standing balance-Leahy Scale: Poor                             ADL either performed or assessed with clinical judgement   ADL Overall ADL's : Needs assistance/impaired Eating/Feeding: Set up;Bed level   Grooming: Wash/dry hands;Wash/dry face;Minimal assistance;Sitting   Upper Body Bathing: Minimal assistance;Sitting   Lower Body Bathing: Moderate assistance;Sit to/from stand           Toilet Transfer: +2 for physical assistance;Moderate assistance;BSC;Stand-pivot Toilet Transfer Details (indicate cue type and reason): requires (A) with L LE to advance to chair. pt with cues for upright posture Toileting- Clothing Manipulation and Hygiene: Moderate assistance;+2 for physical assistance Toileting - Clothing Manipulation Details (indicate cue type and reason): pt requires (A) to complete sit<>stand and (A) for peri care       General ADL Comments: pt limited to stand pivot only at this time due to generalized weakness and (A) required for L LE      Vision         Perception     Praxis      Pertinent Vitals/Pain Pain Assessment: No/denies pain     Hand Dominance Right   Extremity/Trunk Assessment Upper Extremity Assessment Upper Extremity Assessment: LUE deficits/detail LUE Deficits / Details: prior CVA - no active movement- 1 finger subluxation , elbow contracture, decr supination / pronation passive. pt with wrist contracture (flexion) pt with contrractures to 2nd-5th  digits in flexion. pt with    Lower Extremity Assessment Lower Extremity Assessment: Defer to PT evaluation LLE Deficits / Details: prior CVA - grossly 2/5 throughout   Cervical / Trunk Assessment Cervical / Trunk Assessment: Normal   Communication Communication Communication: No difficulties   Cognition Arousal/Alertness:  Awake/alert Behavior During Therapy: WFL for tasks assessed/performed Overall Cognitive Status: Impaired/Different from baseline Area of Impairment: Memory;Following commands;Safety/judgement;Problem solving                     Memory: Decreased short-term memory Following Commands: Follows one step commands consistently;Follows multi-step commands inconsistently Safety/Judgement: Decreased awareness of safety;Decreased awareness of deficits   Problem Solving: Slow processing;Decreased initiation;Difficulty sequencing;Requires verbal cues;Requires tactile cues General Comments: pt repeatly mentioning concern with transfers due to the size of therapist. pt expressing feeling more confident with transfer from female RN previous day. Pt fixated on returning to supine to sleep    General Comments       Exercises     Shoulder Instructions      Home Living Family/patient expects to be discharged to:: Private residence Living Arrangements: Children Available Help at Discharge: Family;Available PRN/intermittently Type of Home: House Home Access: Stairs to enter Enter[employer] CorporationEntrance Stairs-Number of Steps: 4 Entrance Stairs-Rails: Right Home Layout: One level     Bathroom Shower/Tub: Chief Strategy OfficerTub/shower unit   Bathroom Toilet: Standard     Home Equipment: Cane - quad;Shower seat   Additional Comments: pt reports previous splint in hand but not using it [place]      Prior Functioning/Environment Level of Independence: Needs assistance  Gait / Transfers Assistance Needed: ambulates with use of a cane ADL's / Homemaking Assistance Needed: requires assistance from daughter with bathing   Comments: pt noted to have Sealy nails and reports daughter tells her to cut them but pt states "they make my hands look nice so i dont "        OT Problem List: Decreased strength;Decreased activity tolerance;Impaired balance (sitting and/or standing);Decreased safety awareness;Decreased  knowledge of use of DME or AE;Decreased knowledge of precautions;Decreased cognition;Decreased coordination;Decreased range of motion;Impaired UE functional use      OT Treatment/Interventions: Self-care/ADL training;Therapeutic exercise;Neuromuscular education;Energy conservation;DME and/or AE instruction;Manual therapy;Therapeutic activities;Cognitive remediation/compensation;Patient/family education;Balance training    OT Goals(Current goals can be found in the care plan section) Acute Rehab OT Goals Patient Stated Goal: to take a nap OT Goal Formulation: With patient Time For Goal Achievement: 10/21/18 Potential to Achieve Goals: Good  OT Frequency: Min 2X/week   Barriers to D/C:            Co-evaluation PT/OT/SLP Co-Evaluation/Treatment: Yes Reason for Co-Treatment: To address functional/ADL transfers;For patient/therapist safety   OT goals addressed during session: ADL's and self-care;Proper use of Adaptive equipment and DME;Strengthening/ROM      AM-PAC OT "6 Clicks" Daily Activity     Outcome Measure Help from another person eating meals?: A Little Help from another person taking care of personal grooming?: A Little Help from another person toileting, which includes using toliet, bedpan, or urinal?: A Lot Help from another person bathing (including washing, rinsing, drying)?: A Lot Help from another person to put on and taking off regular upper body clothing?: A Little Help from another person to put on and taking off regular lower body clothing?: A Lot 6 Click Score: 15   End of Session Equipment Utilized During Treatment: Gait belt Nurse Communication: Mobility status;Precautions  Activity Tolerance: Patient tolerated treatment well Patient left: in  bed;with call bell/phone within reach;with SCD's reapplied  OT Visit Diagnosis: Unsteadiness on feet (R26.81);Muscle weakness (generalized) (M62.81)                Time: 7829-5621 OT Time Calculation (min): 26  min Charges:  OT General Charges $OT Visit: 1 Visit OT Evaluation $OT Eval Moderate Complexity: 1 Mod   Mateo Flow, OTR/L  Acute Rehabilitation Services Pager: [phone] Office: [phone] .   Mateo Flow 10/07/2018, 9:13 AM

## 2018-10-07 NOTE — Progress Notes (Signed)
Rehab Admissions Coordinator Note:  Patient was screened by Trish Mage for appropriateness for an Inpatient Acute Rehab Consult.  At this time, patient is under observation status and may not have the medical necessity to justify an inpatient rehab stay.  If deficits persist, then can consider inpatient rehab consult.  Lelon Frohlich M 10/07/2018, 8:14 AM  I can be reached at [phone].

## 2018-10-08 DIAGNOSIS — W19XXXA Unspecified fall, initial encounter: Secondary | ICD-10-CM | POA: Diagnosis not present

## 2018-10-08 DIAGNOSIS — Z8673 Personal history of transient ischemic attack (TIA), and cerebral infarction without residual deficits: Secondary | ICD-10-CM | POA: Diagnosis not present

## 2018-10-08 DIAGNOSIS — I10 Essential (primary) hypertension: Secondary | ICD-10-CM | POA: Diagnosis not present

## 2018-10-08 DIAGNOSIS — E785 Hyperlipidemia, unspecified: Secondary | ICD-10-CM | POA: Diagnosis not present

## 2018-10-08 LAB — LAMOTRIGINE LEVEL: Lamotrigine Lvl: 8.3 ug/mL (ref 2.0–20.0)

## 2018-10-08 MED ORDER — POLYETHYLENE GLYCOL 3350 17 G PO PACK: 17.0000 g | PACK | Freq: Every day | ORAL | 5 refills | Status: AC

## 2018-10-08 MED ORDER — METOPROLOL SUCCINATE ER 50 MG PO TB24: 50.0000 mg | ORAL_TABLET | Freq: Every day | ORAL | 5 refills | Status: AC

## 2018-10-08 MED ORDER — LISINOPRIL 20 MG PO TABS: 20.0000 mg | ORAL_TABLET | Freq: Every day | ORAL | 5 refills | Status: AC

## 2018-10-08 MED ORDER — VITAMIN D 1000 UNITS PO TABS: 1000.0000 [IU] | ORAL_TABLET | Freq: Every day | ORAL | 5 refills | Status: AC

## 2018-10-08 MED ORDER — ASPIRIN EC 81 MG PO TBEC: 81.0000 mg | DELAYED_RELEASE_TABLET | Freq: Every day | ORAL | 3 refills | Status: AC

## 2018-10-08 MED ORDER — LAMOTRIGINE 100 MG PO TABS: ORAL_TABLET | ORAL | 3 refills | Status: DC

## 2018-10-08 MED ORDER — ATORVASTATIN CALCIUM 20 MG PO TABS: 20.0000 mg | ORAL_TABLET | Freq: Every evening | ORAL | 5 refills | Status: AC

## 2018-10-08 MED ORDER — SENNOSIDES-DOCUSATE SODIUM 8.6-50 MG PO TABS: 2.0000 | ORAL_TABLET | Freq: Every day | ORAL | 5 refills | Status: AC

## 2018-10-08 MED ORDER — CLOPIDOGREL BISULFATE 75 MG PO TABS: 75.0000 mg | ORAL_TABLET | Freq: Every day | ORAL | 5 refills | Status: DC

## 2018-10-08 NOTE — Plan of Care (Signed)
  Problem: Clinical Measurements: Goal: Will remain free from infection Outcome: Adequate for Discharge   Problem: Clinical Measurements: Goal: Will remain free from infection Outcome: Adequate for Discharge Goal: Diagnostic test results will improve Outcome: Adequate for Discharge   Problem: Clinical Measurements: Goal: Diagnostic test results will improve Outcome: Adequate for Discharge   Problem: Health Behavior/Discharge Planning: Goal: Ability to manage health-related needs will improve Outcome: Progressing   Problem: Education: Goal: Knowledge of General Education information will improve Description Including pain rating scale, medication(s)/side effects and non-pharmacologic comfort measures Outcome: Progressing   Problem: Education: Goal: Knowledge of General Education information will improve Description Including pain rating scale, medication(s)/side effects and non-pharmacologic comfort measures Outcome: Progressing   Problem: Health Behavior/Discharge Planning: Goal: Ability to manage health-related needs will improve Outcome: Progressing

## 2018-10-08 NOTE — Progress Notes (Signed)
Pt given discharge summary and was discharged home via daughter as transportation.

## 2018-10-08 NOTE — Progress Notes (Signed)
Dr. Mariea Clonts and I discussed with the patient and her daughter about the plan of care for discharge. Pt and daughter refused Home health and any type of rehab. They stated they wanted the patient to just go home.

## 2018-10-08 NOTE — Discharge Instructions (Signed)
1)You have refused/Declined skilled nursing facility rehab 2) you have also refused home health physical therapy, occupational therapy and RN services 3) you are taking Plavix and aspirin which are blood thinners so Avoid ibuprofen/Advil/Aleve/Motrin/Goody Powders/Naproxen/BC powders/Meloxicam/Diclofenac/Indomethacin and other Nonsteroidal anti-inflammatory medications as these will make you more likely to bleed and can cause stomach ulcers, can also cause Kidney problems.  4) follow-up with a primary care physician within a week for recheck 5) take MiraLAX and Senokot as prescribed to avoid further constipation 6) please take Lamictal as prescribed to prevent seizures 7)Per [facility] statutes, patients with seizures are not allowed to drive until they have been seizure-free for six months.   Use caution when using heavy equipment or power tools. Avoid working on ladders or at heights. Take showers instead of baths, Do not lock yourself in a room alone (i.e. bathroom).. Ensure the water temperature is not too high on the home water heater. Do not go swimming alone. When caring for infants or small children, sit down when holding, feeding, or changing them to minimize risk of injury to the child in the event you have a seizure.   Do not lock yourself in a room alone (i.e. bathroom).  Maintain good sleep hygiene. Avoid alcohol.   If patienthas another seizure, call 911 and bring them back to the ED if: A. The seizure lasts longer than 5 minutes.  B. The patient doesn't wake shortly after the seizure or has new problems such as difficulty seeing, speaking or moving following the seizure C. The patient was injured during the seizure D. The patient has a temperature over 102 F (39C) E. The patient vomited during the seizure and now is having trouble breathing

## 2018-10-08 NOTE — Care Management Note (Signed)
Case Management Note  Patient Details  Name: [name] [name] MRN: [id number] Date of Birth: Dec 26, 1940  Subjective/Objective:      Pt in with a fall. She is from home with her daughter that works.  DME: transfer chair, cane, walker, shower chair, 3 in 1 No issue obtaining home meds.  Daughter provides needed transportation.  Patient is refusing HH services and states she has all the DME at home she does not need any other DME.               Action/Plan: DC home when ready.  Expected Discharge Date:  10/08/18               Expected Discharge Plan:  Skilled Nursing Facility  In-House Referral:  Clinical Social Work  Discharge planning Services  CM Consult  Post Acute Care Choice:  Home Health Choice offered to:  Patient  DME Arranged:    DME Agency:     HH Arranged:  Patient Refused HH HH Agency:     Status of Service:  Completed, signed off  If discussed at [employer], dates discussed:    Additional Comments:  Leone Haven, RN 10/08/2018, 9:46 AM

## 2018-10-08 NOTE — Discharge Summary (Signed)
[name] [name], is a 78 y.o. female  DOB [birth year]  MRN [id number].  Admission date:  10/05/2018  Admitting Physician  Lorretta Harp, MD  Discharge Date:  10/08/2018   Primary MD  Deatra James, MD  Recommendations for primary care physician for things to follow:   1)You have refused/Declined skilled nursing facility rehab 2) you have also refused home health physical therapy, occupational therapy and RN services 3) you are taking Plavix and aspirin which are blood thinners so Avoid ibuprofen/Advil/Aleve/Motrin/Goody Powders/Naproxen/BC powders/Meloxicam/Diclofenac/Indomethacin and other Nonsteroidal anti-inflammatory medications as these will make you more likely to bleed and can cause stomach ulcers, can also cause Kidney problems.  4) follow-up with a primary care physician within a week for recheck 5) take MiraLAX and Senokot as prescribed to avoid further constipation 6) please take Lamictal as prescribed to prevent seizures 7)Per [facility] statutes, patients with seizures are not allowed to drive until they have been seizure-free for six months.   Use caution when using heavy equipment or power tools. Avoid working on ladders or at heights. Take showers instead of baths, Do not lock yourself in a room alone (i.e. bathroom).. Ensure the water temperature is not too high on the home water heater. Do not go swimming alone. When caring for infants or small children, sit down when holding, feeding, or changing them to minimize risk of injury to the child in the event you have a seizure.   Do not lock yourself in a room alone (i.e. bathroom).  Maintain good sleep hygiene. Avoid alcohol.   If patienthas another seizure, call 911 and bring them back to the ED if: A. The seizure lasts longer than 5 minutes.  B. The patient doesn't wake shortly after the seizure or has new problems such as difficulty seeing,  speaking or moving following the seizure C. The patient was injured during the seizure D. The patient has a temperature over 102 F (39C) E. The patient vomited during the seizure and now is having trouble breathing   Admission Diagnosis  seizures   Discharge Diagnosis  seizures    Principal Problem:   Fall Active Problems:   Seizure disorder (HCC)   HTN (hypertension)   History of stroke   HLD (hyperlipidemia)   CAD (coronary artery disease)      Past Medical History:  Diagnosis Date  . CAD (coronary artery disease) 1997    with CABG  . Cervical spine fracture [facility])    Not requiring surgery  . Chest pain   . Constipation   . Foot drop, left   . Gait disturbance    quad cane  . Headache(784.0) 03/14/2013  . Heart attack (HCC)   . Hemiparesis and alteration of sensations as late effects of stroke (HCC) 03/14/2013   left  . Hypercholesterolemia   . Hypertension   . Osteoporosis   . Palpitations   . Periorbital hematoma of left eye   . Peripheral vascular disease (HCC)    Left superficial femoral artery  . Peroneal  DVT (deep venous thrombosis)   . Post-menopausal   . PVD (peripheral vascular disease) (HCC)   . Seizures (HCC)   . Stroke (HCC)   . Stroke (HCC) 2010  . Urinary incontinence     Past Surgical History:  Procedure Laterality Date  . CATARACT EXTRACTION    . CORONARY ARTERY BYPASS GRAFT     x3       HPI  from the history and physical done on the day of admission:   Patient coming from:  The patient is coming from home.  At baseline, pt is independent for most of ADL.        Chief Complaint: fall and possible seizure  HPI: [name] [name] is a 78 y.o. female with medical history significant of hypertension, hyperlipidemia, stroke with left-sided weakness, CAD, CABG, PVD, DVT, seizure, who presents with fall and possible seizure.  Per her daughter, patient was found on the floor in the bathroom in the evening. Pt was getting ready for bed and  does not remember what happened. Daughter heard her fall from the other room.When daughter came to investigate. patient was having lipsmacking, grunting, eyes deviating. She was unresponsive for about 3 minutes. Her daughter suspects that the patient may have had seizure. Pt reports that head hurt, and was found to have left parietal scalp hematoma. When I saw pt in ED, she is drowsy, but is oriented x 3.  Patient denies any chest pain, shortness breath, cough, fever or chills.  No nausea, vomiting, diarrhea, abdominal pain.  Patient states that she has burning on urination. Per her daughter, patient has chronic intermittent burning sensation due to vaginal atrophy.  No dysuria or increased urinary frequency.  Patient has left-sided weakness from previous stroke which has not changed.  Per her daughter, patient is taking her Lamictal for seizure consistently.  ED Course: pt was found to have WBC 5.4, electrolytes renal function okay, CK=47, INR 0.98, urinalysis not impressive, temperature normal, heart rate 60s, oxygen saturation 99% on room air.  CT head is negative for acute intracranial abnormalities, but showed old right MCA stroke.  CT of C-spine is negative for acute bony issues.  Patient is placed on telemetry bed for observation    Hospital Course:    Brief Summary 78 year old female with past medical history relevant for prior CVA in 2010 with residual left-sided hemiparesis, history of CAD with prior CABG, history of seizures, history of DVT and PVD and hypertension admitted on 10/06/2018 after a fall at home with concerns about syncope Versus seizures   Plan:-  1)S/p Fall--- ---patient now with generalized weakness, difficulties with ADLs, PT eval and OT eval noted, recommend rehab, social work was trying to help with  placement to SNF rehab, at baseline patient does have left-sided hemiparesis from previous stroke --------despite back-and-forth and extensive conversations with patient and  daughter with RN present and prior conversations with Child psychotherapist and case managers----patient and her daughter at this time declined SNF rehab, they also declined home health PT, OT or RN services  2)H/o SZ--- patient had a fall at home prior to admission, raising concerns about possible seizures, , clinically low index of suspicion for seizures, CKs are not elevated, serum Lamictal levels are WNL (8.3), continue Lamictal at this time  3) status post fall at home--- ??  Syncope, echo without significant new acute findings, EF is 60 to 65% with diastolic dysfunction, no significant valvular abnormalities specifically no significant aortic stenosis  4)Constipation----overall improved after Dulcolax  suppository and Miralax, continue MiraLAX  during the day and Senokot at at bedtime  5)H/o CVA--- patient had a stroke in 2010 with residual left-sided hemiparesis, continue aspirin 81 mg, continue Lipitor 20 mg daily, she declines SNF rehab  6)HTN--- stable, continue lisinopril 20 mg daily, metoprolol XL 50 mg daily  7)History of CAD/status post prior CABG--- stable, no ACS type symptoms, continue aspirin, Lipitor metoprolol, restart Plavix    Discharge Condition: stable  Follow UP--- PCP within a week   Consults obtained -PT/OT/social work/case manager  Diet and Activity recommendation:  As advised  Discharge Instructions    Discharge Instructions    Call MD for:  difficulty breathing, headache or visual disturbances   Complete by:  As directed    Call MD for:  persistant dizziness or light-headedness   Complete by:  As directed    Call MD for:  persistant nausea and vomiting   Complete by:  As directed    Call MD for:  severe uncontrolled pain   Complete by:  As directed    Call MD for:  temperature >100.4   Complete by:  As directed    Diet - low sodium heart healthy   Complete by:  As directed    Discharge instructions   Complete by:  As directed    1)You have  refused/Declined skilled nursing facility rehab 2) you have also refused home health physical therapy, occupational therapy and RN services 3) you are taking Plavix and aspirin which are blood thinners so Avoid ibuprofen/Advil/Aleve/Motrin/Goody Powders/Naproxen/BC powders/Meloxicam/Diclofenac/Indomethacin and other Nonsteroidal anti-inflammatory medications as these will make you more likely to bleed and can cause stomach ulcers, can also cause Kidney problems.  4) follow-up with a primary care physician within a week for recheck 5) take MiraLAX and Senokot as prescribed to avoid further constipation 6) please take Lamictal as prescribed to prevent seizures 7)Per [facility] [place], patients with seizures are not allowed to drive until they have been seizure-free for six months.   Use caution when using heavy equipment or power tools. Avoid working on ladders or at heights. Take showers instead of baths, Do not lock yourself in a room alone (i.e. bathroom).. Ensure the water temperature is not too high on the home water heater. Do not go swimming alone. When caring for infants or small children, sit down when holding, feeding, or changing them to minimize risk of injury to the child in the event you have a seizure.   Do not lock yourself in a room alone (i.e. bathroom).  Maintain good sleep hygiene. Avoid alcohol.   If patienthas another seizure, call 911 and bring them back to the ED if: A. The seizure lasts longer than 5 minutes.  B. The patient doesn't wake shortly after the seizure or has new problems such as difficulty seeing, speaking or moving following the seizure C. The patient was injured during the seizure D. The patient has a temperature over 102 F (39C) E. The patient vomited during the seizure and now is having trouble breathing   Walk with assistance   Complete by:  As directed    Very high fall risk        Discharge Medications     Allergies as of  10/08/2018      Reactions   Latex Rash   Penicillins Rash   Has patient had a PCN reaction causing immediate rash, facial/tongue/throat swelling, SOB or lightheadedness with hypotension: No Has patient had a PCN reaction causing severe  rash involving mucus membranes or skin necrosis: No Has patient had a PCN reaction that required hospitalization No Has patient had a PCN reaction occurring within the last 10 years: No If all of the above answers are "NO", then may proceed with Cephalosporin use.   Tape Rash      Medication List    TAKE these medications   acetaminophen 500 MG tablet Commonly known as:  TYLENOL Take 1-2 tablets (500-1,000 mg total) by mouth every 8 (eight) hours as needed for mild pain.   aspirin EC 81 MG tablet Take 1 tablet (81 mg total) by mouth daily. With Breakfast What changed:  additional instructions   atorvastatin 20 MG tablet Commonly known as:  LIPITOR Take 1 tablet (20 mg total) by mouth every evening.   cholecalciferol 1000 units tablet Commonly known as:  VITAMIN D Take 1 tablet (1,000 Units total) by mouth daily.   clopidogrel 75 MG tablet Commonly known as:  PLAVIX Take 1 tablet (75 mg total) by mouth daily.   lamoTRIgine 100 MG tablet Commonly known as:  LAMICTAL TAKE 1 TABLET(100 MG) BY MOUTH TWICE DAILY What changed:    how much to take  how to take this  when to take this  additional instructions   lisinopril 20 MG tablet Commonly known as:  PRINIVIL,ZESTRIL Take 1 tablet (20 mg total) by mouth at bedtime.   metoprolol succinate 50 MG 24 hr tablet Commonly known as:  TOPROL-XL Take 1 tablet (50 mg total) by mouth daily. Take with or immediately following a meal. What changed:  See the new instructions.   nitroGLYCERIN 0.4 MG SL tablet Commonly known as:  NITROSTAT DISSOLVE 1 TABLET UNDER THE TONGUE EVERY 5 MINUTES AS NEEDED FOR CHEST PAIN OR SHORTNESS OF BREATH What changed:  See the new instructions.   polyethylene  glycol packet Commonly known as:  MIRALAX / GLYCOLAX Take 17 g by mouth daily as needed for mild constipation. What changed:  Another medication with the same name was added. Make sure you understand how and when to take each.   polyethylene glycol packet Commonly known as:  MIRALAX / GLYCOLAX Take 17 g by mouth daily. What changed:  You were already taking a medication with the same name, and this prescription was added. Make sure you understand how and when to take each.   senna-docusate 8.6-50 MG tablet Commonly known as:  Senokot-S Take 2 tablets by mouth at bedtime.       Major procedures and Radiology Reports - PLEASE review detailed and final reports for all details, in brief -   Ct Head Wo Contrast  Result Date: 10/06/2018 CLINICAL DATA:  Status post fall, found on the floor. Seizure activity suspected. History of left hemiparesis. EXAM: CT HEAD WITHOUT CONTRAST CT CERVICAL SPINE WITHOUT CONTRAST TECHNIQUE: Multidetector CT imaging of the head and cervical spine was performed following the standard protocol without intravenous contrast. Multiplanar CT image reconstructions of the cervical spine were also generated. COMPARISON:  07/14/2018 FINDINGS: CT HEAD FINDINGS Brain: No evidence of acute infarction, hemorrhage, extra-axial collection, ventriculomegaly, or mass effect. Old right MCA territory infarct with encephalomalacia and ex vacuo dilatation of the right lateral ventricle. Generalized cerebral atrophy. Periventricular white matter low attenuation likely secondary to microangiopathy. Vascular: Cerebrovascular atherosclerotic calcifications are noted. Skull: Negative for fracture or focal lesion. Sinuses/Orbits: Visualized portions of the orbits are unremarkable. Visualized portions of the paranasal sinuses and mastoid air cells are unremarkable. Other: Large left parietal scalp hematoma. CT CERVICAL  SPINE FINDINGS Alignment: Normal. Skull base and vertebrae: No acute fracture. No  primary bone lesion or focal pathologic process. Soft tissues and spinal canal: No prevertebral fluid or swelling. No visible canal hematoma. Disc levels: Degenerative disc disease with disc height loss at C5-6 with a broad-based disc osteophyte complex, bilateral uncovertebral degenerative changes and bilateral foraminal stenosis, right greater than left. Small central disc protrusion at C2-3. Upper chest: Lung apices are clear. Other: No fluid collection or hematoma. IMPRESSION: 1. No acute intracranial pathology. 2.  No acute osseous injury of the cervical spine. 3. Mild cervical spine spondylosis as described above. 4. Large left parietal scalp hematoma. 5. Old right MCA territory infarct. Electronically Signed   By: Elige Ko   On: 10/06/2018 01:16   Ct Cervical Spine Wo Contrast  Result Date: 10/06/2018 CLINICAL DATA:  Status post fall, found on the floor. Seizure activity suspected. History of left hemiparesis. EXAM: CT HEAD WITHOUT CONTRAST CT CERVICAL SPINE WITHOUT CONTRAST TECHNIQUE: Multidetector CT imaging of the head and cervical spine was performed following the standard protocol without intravenous contrast. Multiplanar CT image reconstructions of the cervical spine were also generated. COMPARISON:  07/14/2018 FINDINGS: CT HEAD FINDINGS Brain: No evidence of acute infarction, hemorrhage, extra-axial collection, ventriculomegaly, or mass effect. Old right MCA territory infarct with encephalomalacia and ex vacuo dilatation of the right lateral ventricle. Generalized cerebral atrophy. Periventricular white matter low attenuation likely secondary to microangiopathy. Vascular: Cerebrovascular atherosclerotic calcifications are noted. Skull: Negative for fracture or focal lesion. Sinuses/Orbits: Visualized portions of the orbits are unremarkable. Visualized portions of the paranasal sinuses and mastoid air cells are unremarkable. Other: Large left parietal scalp hematoma. CT CERVICAL SPINE FINDINGS  Alignment: Normal. Skull base and vertebrae: No acute fracture. No primary bone lesion or focal pathologic process. Soft tissues and spinal canal: No prevertebral fluid or swelling. No visible canal hematoma. Disc levels: Degenerative disc disease with disc height loss at C5-6 with a broad-based disc osteophyte complex, bilateral uncovertebral degenerative changes and bilateral foraminal stenosis, right greater than left. Small central disc protrusion at C2-3. Upper chest: Lung apices are clear. Other: No fluid collection or hematoma. IMPRESSION: 1. No acute intracranial pathology. 2.  No acute osseous injury of the cervical spine. 3. Mild cervical spine spondylosis as described above. 4. Large left parietal scalp hematoma. 5. Old right MCA territory infarct. Electronically Signed   By: Elige Ko   On: 10/06/2018 01:16     Today   Subjective    [name] [name] today has no new complaints, despite back-and-forth and extensive conversations with patient and daughter with RN present and prior conversations with social worker and case managers----patient and her daughter at this time declined SNF rehab, they also declined home health PT, OT or RN services           Patient has been seen and examined prior to discharge   Objective   Blood pressure (!) 147/65, pulse (!) 52, temperature 98.3 F (36.8 C), temperature source Oral, resp. rate 16, SpO2 100 %.   Intake/Output Summary (Last 24 hours) at 10/08/2018 0845 Last data filed at 10/08/2018 0600 Gross per 24 hour  Intake 200 ml  Output 1300 ml  Net -1100 ml    Exam Gen:- Awake Alert,  In no apparent distress  HEENT:- Resolving Scalp hematoma, no sclera icterus Neck-Supple Neck,No JVD,.  Lungs-  CTAB , fair symmetrical air movement CV- S1, S2 normal, regular  Abd-  +ve B.Sounds, Abd Soft, No tenderness,  Extremity/Skin:- No  edema, pedal pulses present  Psych-affect is appropriate, oriented x3 Neuro-patient with residual left LE paresis  and left upper extremity plegia  which are not new , no tremors   Data Review   CBC w Diff:  Lab Results  Component Value Date   WBC 7.8 10/06/2018   HGB 12.4 10/06/2018   HCT 40.3 10/06/2018   PLT 213 10/06/2018   LYMPHOPCT 27 10/06/2018   MONOPCT 7 10/06/2018   EOSPCT 1 10/06/2018   BASOPCT 1 10/06/2018    CMP:  Lab Results  Component Value Date   NA 141 10/06/2018   NA 141 12/09/2015   K 4.2 10/06/2018   CL 106 10/06/2018   CO2 24 10/06/2018   BUN 8 10/06/2018   BUN 14 12/09/2015   CREATININE 0.77 10/06/2018   GLU 94 12/09/2015   PROT 7.0 10/06/2018   ALBUMIN 3.5 10/06/2018   BILITOT 0.4 10/06/2018   ALKPHOS 65 10/06/2018   AST 23 10/06/2018   ALT 23 10/06/2018  .   Total Discharge time is about 33 minutes  Shon Haleourage Oluwatobi Visser M.D on 10/08/2018 at 8:45 AM  Go to [email or web address] -  for contact info  [facility] - Office  [phone]

## 2018-10-28 DIAGNOSIS — I69354 Hemiplegia and hemiparesis following cerebral infarction affecting left non-dominant side: Secondary | ICD-10-CM | POA: Diagnosis not present

## 2018-10-28 DIAGNOSIS — I10 Essential (primary) hypertension: Secondary | ICD-10-CM | POA: Diagnosis not present

## 2018-10-28 DIAGNOSIS — G40309 Generalized idiopathic epilepsy and epileptic syndromes, not intractable, without status epilepticus: Secondary | ICD-10-CM | POA: Diagnosis not present

## 2019-06-26 DIAGNOSIS — M810 Age-related osteoporosis without current pathological fracture: Secondary | ICD-10-CM | POA: Diagnosis not present

## 2019-06-26 DIAGNOSIS — G40309 Generalized idiopathic epilepsy and epileptic syndromes, not intractable, without status epilepticus: Secondary | ICD-10-CM | POA: Diagnosis not present

## 2019-06-26 DIAGNOSIS — I69354 Hemiplegia and hemiparesis following cerebral infarction affecting left non-dominant side: Secondary | ICD-10-CM | POA: Diagnosis not present

## 2019-06-26 DIAGNOSIS — E441 Mild protein-calorie malnutrition: Secondary | ICD-10-CM | POA: Diagnosis not present

## 2019-06-26 DIAGNOSIS — Z0000 Encounter for general adult medical examination without abnormal findings: Secondary | ICD-10-CM | POA: Diagnosis not present

## 2019-06-26 DIAGNOSIS — I739 Peripheral vascular disease, unspecified: Secondary | ICD-10-CM | POA: Diagnosis not present

## 2019-06-26 DIAGNOSIS — E785 Hyperlipidemia, unspecified: Secondary | ICD-10-CM | POA: Diagnosis not present

## 2019-06-26 DIAGNOSIS — I2510 Atherosclerotic heart disease of native coronary artery without angina pectoris: Secondary | ICD-10-CM | POA: Diagnosis not present

## 2019-06-26 DIAGNOSIS — I10 Essential (primary) hypertension: Secondary | ICD-10-CM | POA: Diagnosis not present

## 2019-07-02 IMAGING — CT CT MAXILLOFACIAL W/O CM
3 of 6 series · 15 of 47 positions shown, 18 images · non-contrast
Comparison: 12/01/2015

CLINICAL DATA: Recent trip and fall with facial injury and
headaches, initial encounter

EXAM:
CT HEAD WITHOUT CONTRAST
CT MAXILLOFACIAL WITHOUT CONTRAST
TECHNIQUE: Multidetector CT imaging of the head and maxillofacial structures
were performed using the standard protocol without intravenous
contrast. Multiplanar CT image reconstructions of the maxillofacial
structures were also generated.

[Series 5: head 3.0 mpr cor · coronal · 0.35mm/px · 3 of 71 slices shown]
[im 18/71  bone]
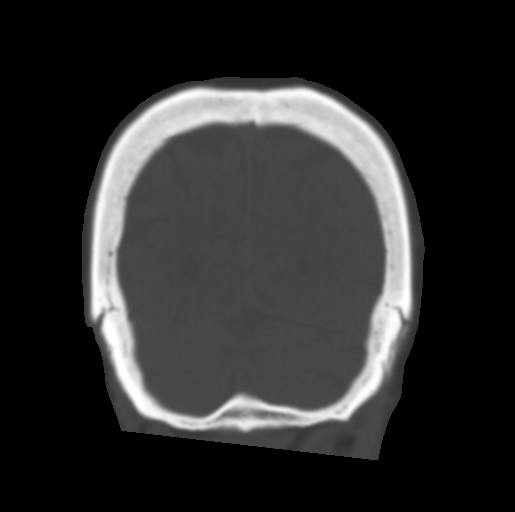
[im 36/71  bone]
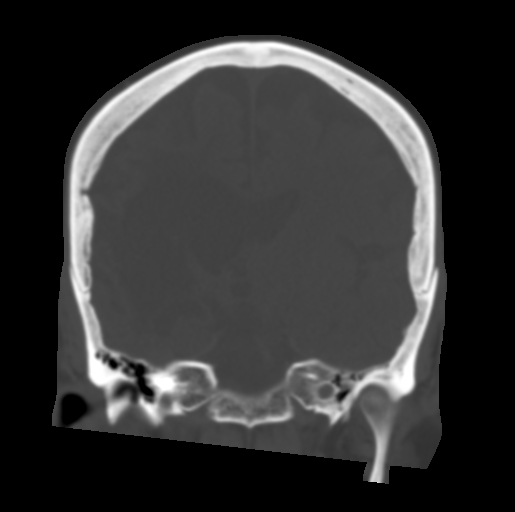
[im 53/71  bone]
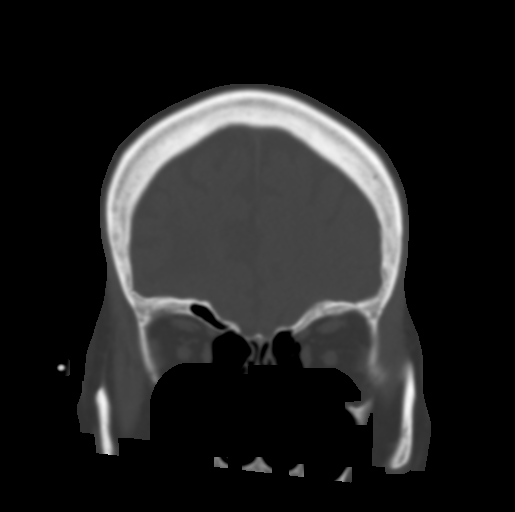

[Series 6: head 3.0 mpr sag · sagittal · 0.33mm/px · 1 of 56 slices shown]
[im 28/56  bone]
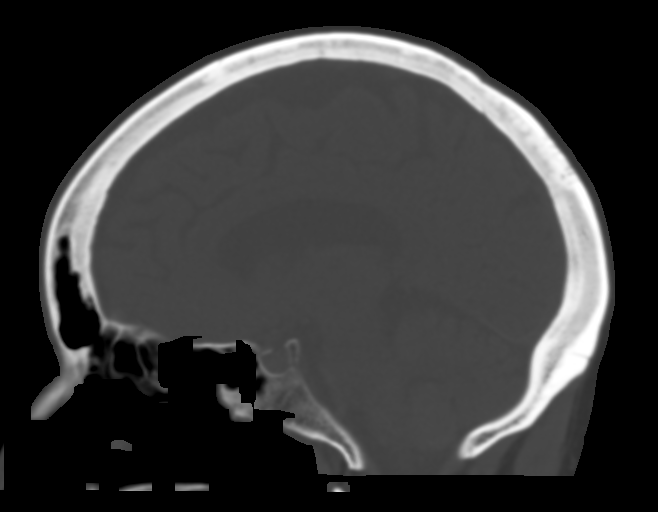

[Series 7: facial/ orbits 2.0 h30s · axial · 0.34mm/px · z∈[-190,-20]mm · 11 of 95 slices shown, 14 images]
[im 5/95  brain]
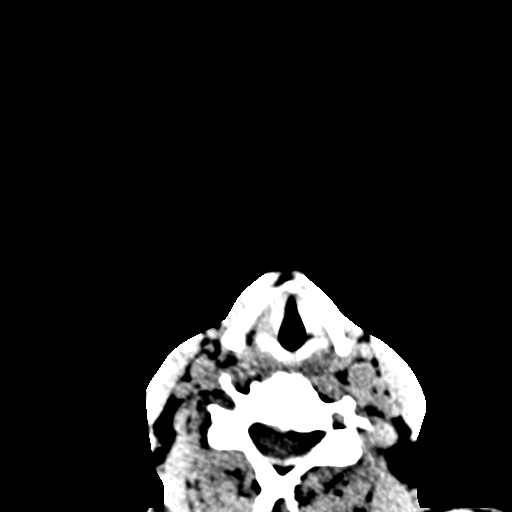
[im 5/95  bone]
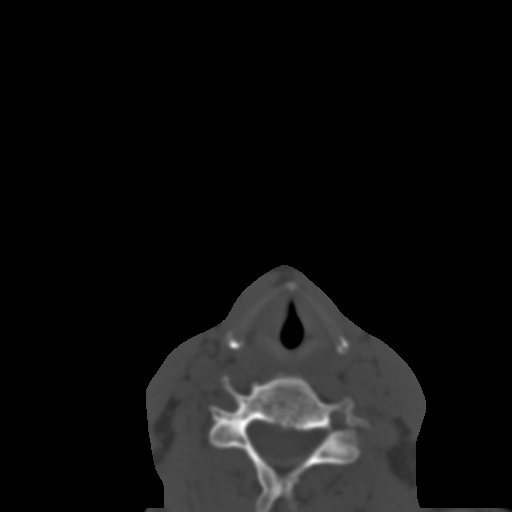
[im 14/95  bone]
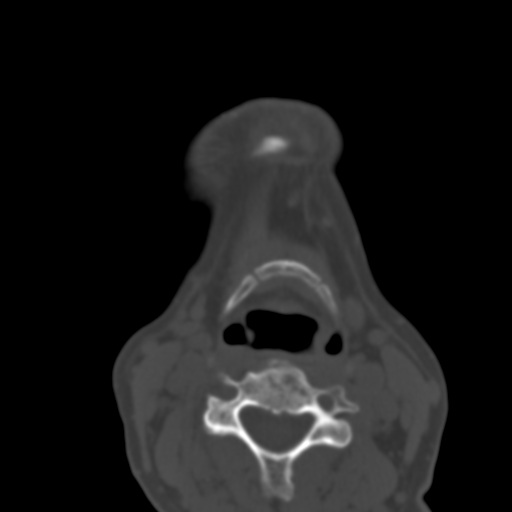
[im 23/95  bone]
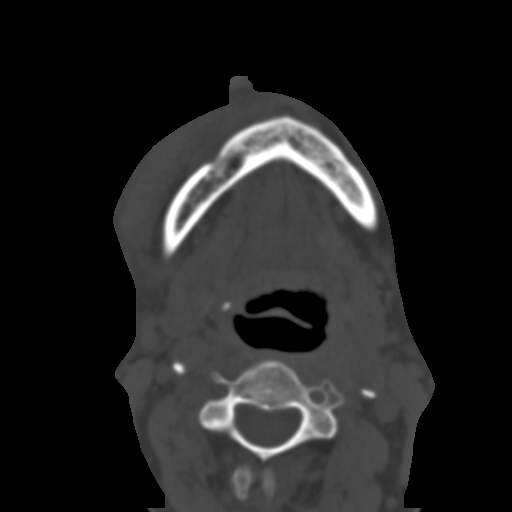
[im 32/95  bone]
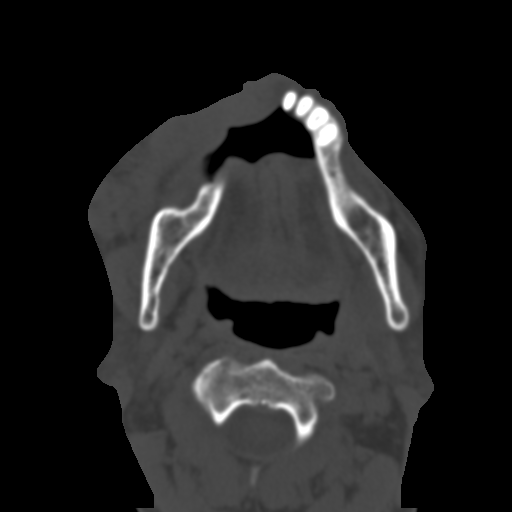
[im 41/95  brain]
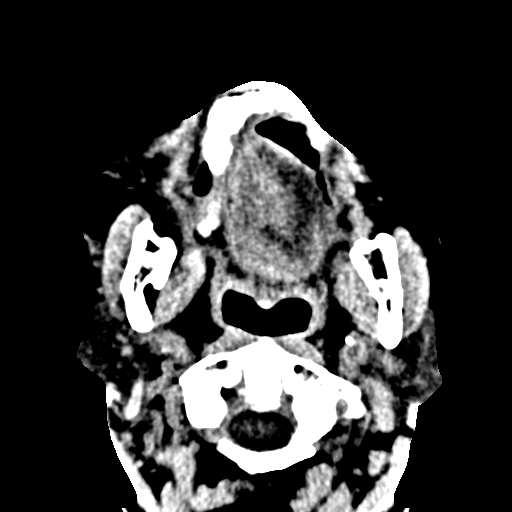
[im 41/95  bone]
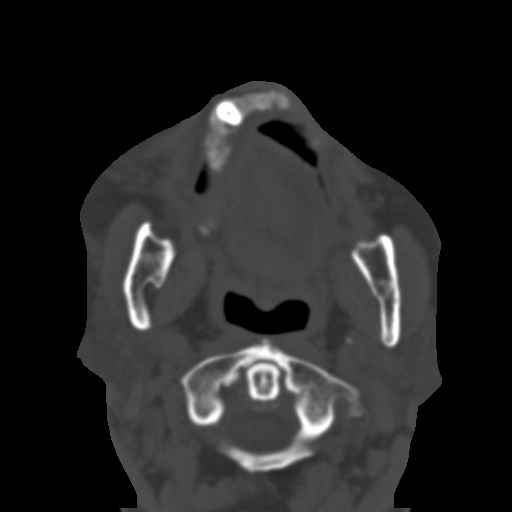
[im 50/95  bone]
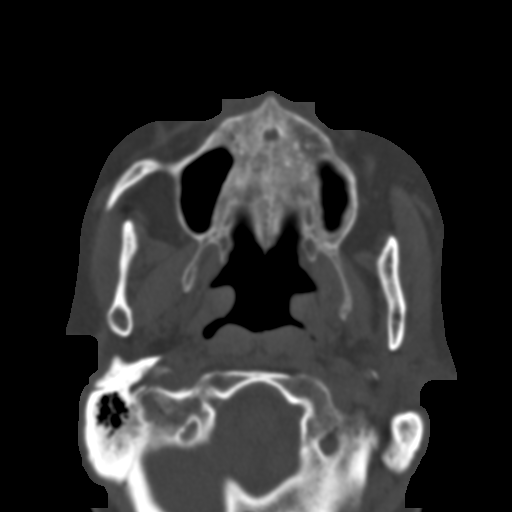
[im 54/95  bone]
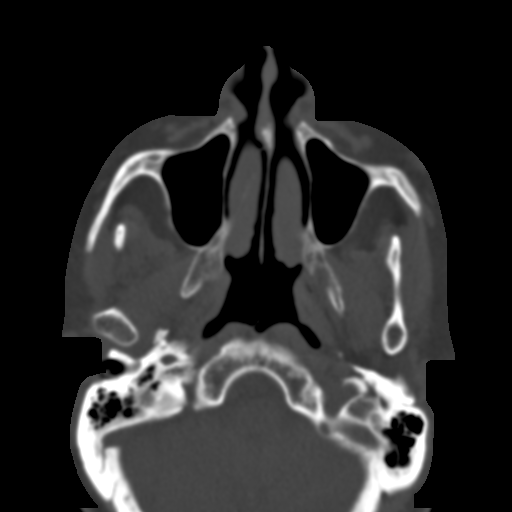
[im 63/95  bone]
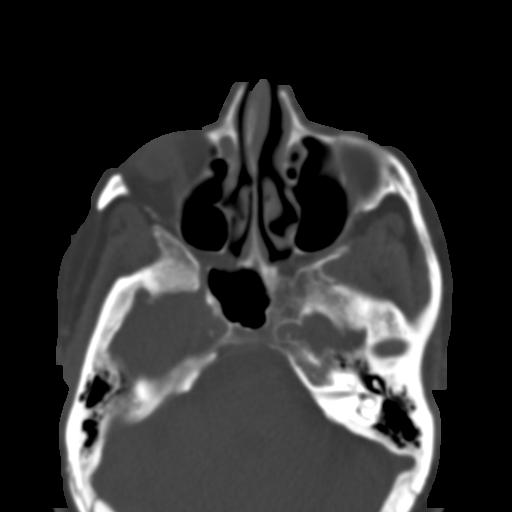
[im 72/95  brain]
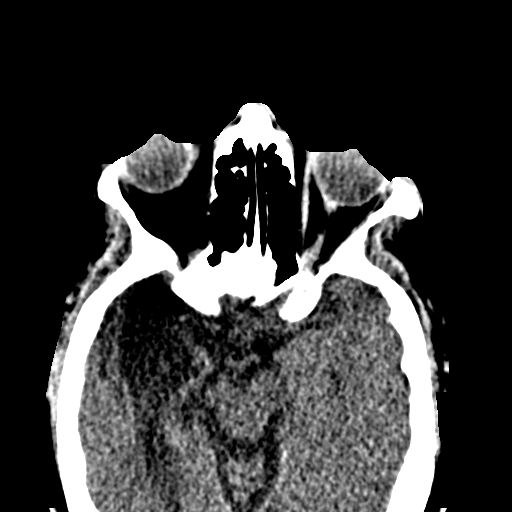
[im 72/95  bone]
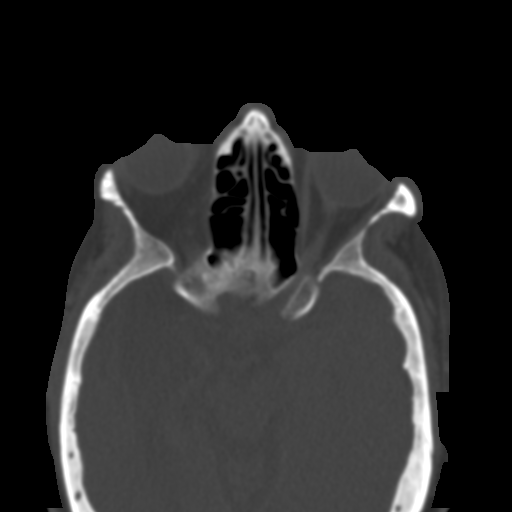
[im 81/95  bone]
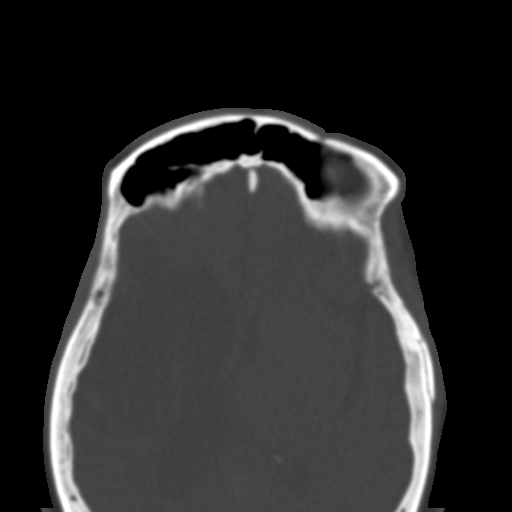
[im 90/95  bone]
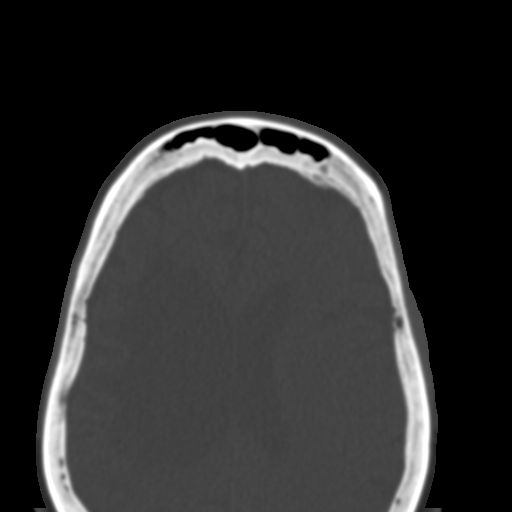

[15 of 47 positions shown; findings below may reference images not displayed]

FINDINGS: CT HEAD FINDINGS

Brain: Mild atrophic changes are noted. Changes of prior right-sided
MCA infarct are again seen and stable. No findings to suggest acute
hemorrhage, acute infarction or space-occupying mass lesion are
noted.

Vascular: No hyperdense vessel or unexpected calcification.

Skull: Normal. Negative for fracture or focal lesion.

Other: None.

CT MAXILLOFACIAL FINDINGS

Osseous: No acute bony abnormality is identified. The patient is
predominately edentulous.

Orbits: Orbits and their contents are within normal limits.

Sinuses: Paranasal sinuses are well aerated. No air-fluid levels are
seen.

Soft tissues: Considerable soft tissue swelling is noted over the
chin and right side of the mandible consistent with the recent
injury. No focal hematoma is seen. No other soft tissue abnormality
is noted.

Considerable cerumen is noted in both ear canals.
IMPRESSION: CT of the head: Changes of prior right MCA infarct without acute
abnormality. Mild atrophic changes are noted.

CT of the maxillofacial bones: Considerable soft tissue swelling
over the right half of the mandible consistent with the recent
injury. No acute bony abnormality is seen.

## 2019-07-03 ENCOUNTER — Ambulatory Visit: Payer: Self-pay | Admitting: Adult Health

## 2019-07-03 ENCOUNTER — Ambulatory Visit: Payer: Medicare PPO | Admitting: Adult Health

## 2019-07-03 ENCOUNTER — Encounter: Payer: Self-pay | Admitting: Adult Health

## 2019-07-03 ENCOUNTER — Telehealth: Payer: Self-pay

## 2019-07-03 NOTE — Telephone Encounter (Signed)
Patient was a no call/no show for their appointment today.   

## 2019-07-11 DIAGNOSIS — E785 Hyperlipidemia, unspecified: Secondary | ICD-10-CM | POA: Diagnosis not present

## 2019-07-11 DIAGNOSIS — I10 Essential (primary) hypertension: Secondary | ICD-10-CM | POA: Diagnosis not present

## 2019-07-25 ENCOUNTER — Telehealth (INDEPENDENT_AMBULATORY_CARE_PROVIDER_SITE_OTHER): Payer: Medicare PPO | Admitting: Adult Health

## 2019-07-25 DIAGNOSIS — G40909 Epilepsy, unspecified, not intractable, without status epilepticus: Secondary | ICD-10-CM

## 2019-07-25 DIAGNOSIS — Z8673 Personal history of transient ischemic attack (TIA), and cerebral infarction without residual deficits: Secondary | ICD-10-CM

## 2019-07-25 NOTE — Progress Notes (Signed)
I have read the note, and I agree with the clinical assessment and plan.  Arlene Genova K Shaft Corigliano   

## 2019-07-25 NOTE — Progress Notes (Signed)
PATIENT: [name] [name] DOB: [birth year]  REASON FOR VISIT: follow up HISTORY FROM: patient  Virtual Visit via Video Note  I connected with [name] [name] on 07/25/19 at  8:00 AM EST by a video enabled telemedicine application located remotely at [facility] and verified that I am speaking with the correct person using two identifiers who was located at their own home.   I discussed the limitations of evaluation and management by telemedicine and the availability of in person appointments. The patient expressed understanding and agreed to proceed.   PATIENT: [name] [name] DOB: 1940/12/18  REASON FOR VISIT: follow up HISTORY FROM: patient  HISTORY OF PRESENT ILLNESS: Today 07/25/19:  [name] [name] is a 78 year old female with a history of stroke in December 2010 with left hemiparesis and history of seizures.  She returns today for follow-up.  She denies any strokelike symptoms.  She reports her blood pressure has been elevated and her PCP recently increase metoprolol to 100 mg.  She remains on Plavix and aspirin.  She denies any seizure events.  She continues on Lamictal 100 mg twice a day.  She lives at home with her daughter.  She is able to complete most ADLs independently her daughter does have to help her get dressed.  She returns today for an evaluation.  HISTORY 10/30/2019CM [name] [name], 78 year old female returns for follow-up with history of stroke in December 2010 with continued left hemiparesis.  She remains on Plavix and aspirin for secondary stroke prevention without further stroke or TIA symptoms.  She also has a history of seizure disorder and is on Lamictal 100 mg milligrams twice daily.  Last seizure was 2016.  She continues to live  with her daughter, dependent for activities of daily living.  Blood pressure noted to be elevated in the office today she has not taken her blood pressure medication.  One recent fall no apparent injury.  She ambulates in the house with  a quad cane.  She returns for reevaluation   REVIEW OF SYSTEMS: Out of a complete 14 system review of symptoms, the patient complains only of the following symptoms, and all other reviewed systems are negative.  See HPI  ALLERGIES: Allergies  Allergen Reactions  . Latex Rash  . Penicillins Rash    Has patient had a PCN reaction causing immediate rash, facial/tongue/throat swelling, SOB or lightheadedness with hypotension: No Has patient had a PCN reaction causing severe rash involving mucus membranes or skin necrosis: No Has patient had a PCN reaction that required hospitalization No Has patient had a PCN reaction occurring within the last 10 years: No If all of the above answers are "NO", then may proceed with Cephalosporin use.  . Tape Rash    HOME MEDICATIONS: Outpatient Medications Prior to Visit  Medication Sig Dispense Refill  . acetaminophen (TYLENOL) 500 MG tablet Take 1-2 tablets (500-1,000 mg total) by mouth every 8 (eight) hours as needed for mild pain. 90 tablet 0  . aspirin EC 81 MG tablet Take 1 tablet (81 mg total) by mouth daily. With Breakfast 120 tablet 3  . atorvastatin (LIPITOR) 20 MG tablet Take 1 tablet (20 mg total) by mouth every evening. 30 tablet 5  . cholecalciferol (VITAMIN D) 1000 units tablet Take 1 tablet (1,000 Units total) by mouth daily. 30 tablet 5  . clopidogrel (PLAVIX) 75 MG tablet Take 1 tablet (75 mg total) by mouth daily. 30 tablet 5  . lamoTRIgine (LAMICTAL) 100 MG tablet TAKE  1 TABLET(100 MG) BY MOUTH TWICE DAILY 180 tablet 3  . lisinopril (PRINIVIL,ZESTRIL) 20 MG tablet Take 1 tablet (20 mg total) by mouth at bedtime. 30 tablet 5  . metoprolol succinate (TOPROL-XL) 50 MG 24 hr tablet Take 1 tablet (50 mg total) by mouth daily. Take with or immediately following a meal. (Patient taking differently: Take 100 mg by mouth daily. Take with or immediately following a meal.) 30 tablet 5  . nitroGLYCERIN (NITROSTAT) 0.4 MG SL tablet DISSOLVE 1  TABLET UNDER THE TONGUE EVERY 5 MINUTES AS NEEDED FOR CHEST PAIN OR SHORTNESS OF BREATH (Patient taking differently: Place 0.4 mg under the tongue every 5 (five) minutes as needed for chest pain. ) 75 tablet 0  . polyethylene glycol (MIRALAX / GLYCOLAX) packet Take 17 g by mouth daily as needed for mild constipation.     . polyethylene glycol (MIRALAX / GLYCOLAX) packet Take 17 g by mouth daily. 30 each 5  . senna-docusate (SENOKOT-S) 8.6-50 MG tablet Take 2 tablets by mouth at bedtime. 60 tablet 5   No facility-administered medications prior to visit.     PAST MEDICAL HISTORY: Past Medical History:  Diagnosis Date  . CAD (coronary artery disease) 1997    with CABG  . Cervical spine fracture [facility])    Not requiring surgery  . Chest pain   . Constipation   . Foot drop, left   . Gait disturbance    quad cane  . Headache(784.0) 03/14/2013  . Heart attack ([place])   . Hemiparesis and alteration of sensations as late effects of stroke ([place]) 03/14/2013   left  . Hypercholesterolemia   . Hypertension   . Osteoporosis   . Palpitations   . Periorbital hematoma of left eye   . Peripheral vascular disease ([place])    Left superficial femoral artery  . Peroneal DVT (deep venous thrombosis)   . Post-menopausal   . PVD (peripheral vascular disease) ([place])   . Seizures ([place])   . Stroke ([place])   . Stroke ([place]) 2010  . Urinary incontinence     PAST SURGICAL HISTORY: Past Surgical History:  Procedure Laterality Date  . CATARACT EXTRACTION    . CORONARY ARTERY BYPASS GRAFT     x3    FAMILY HISTORY: Family History  Problem Relation Age of Onset  . Heart attack Mother   . Heart attack Father   . Heart attack Sister   . Heart attack Brother     SOCIAL HISTORY: Social History   Socioeconomic History  . Marital status: Widowed    Spouse name: Not on file  . Number of children: 5  . Years of education: 10  . Highest education level: Not on file  Occupational History  . Occupation:  Retired  Scientific laboratory technician  . Financial resource strain: Not on file  . Food insecurity    Worry: Not on file    Inability: Not on file  . Transportation needs    Medical: Not on file    Non-medical: Not on file  Tobacco Use  . Smoking status: Never Smoker  . Smokeless tobacco: Never Used  Substance and Sexual Activity  . Alcohol use: No  . Drug use: No  . Sexual activity: Not on file  Lifestyle  . Physical activity    Days per week: Not on file    Minutes per session: Not on file  . Stress: Not on file  Relationships  . Social connections    Talks on phone: Not on  file    Gets together: Not on file    Attends religious service: Not on file    Active member of club or organization: Not on file    Attends meetings of clubs or organizations: Not on file    Relationship status: Not on file  . Intimate partner violence    Fear of current or ex partner: Not on file    Emotionally abused: Not on file    Physically abused: Not on file    Forced sexual activity: Not on file  Other Topics Concern  . Not on file  Social History Narrative   Patient is a widowed and lives with her daughter [name].   Patient has three living children and two are deceased.   Patient is retired.   Patient has a 10th grade education.   Patient is right-handed.   Patient does not drink caffeine.         PHYSICAL EXAM Generalized: Well developed, in no acute distress   Neurological examination  Mentation: Alert oriented to time, place, history taking. Follows all commands speech and language fluent Cranial nerve II-XII:Extraocular movements were full. Facial symmetry noted. uvula tongue midline.  Shoulder shrug- unable to lift the left shoulder. Motor: Unable to move the left arm.  Reports weakness in the left leg.  Good strength in the right upper and lower extremity. Sensory: Sensory testing is intact to soft touch on all 4 extremities subjectively per patient Coordination: Cerebellar testing  reveals good finger-nose-finger on the right.  Unable to complete on the left. Gait and station: Unable to assess Reflexes: UTA  DIAGNOSTIC DATA (LABS, IMAGING, TESTING) - I reviewed patient records, labs, notes, testing and imaging myself where available.  Lab Results  Component Value Date   WBC 7.8 10/06/2018   HGB 12.4 10/06/2018   HCT 40.3 10/06/2018   MCV 91.0 10/06/2018   PLT 213 10/06/2018      Component Value Date/Time   NA 141 10/06/2018 0240   NA 141 12/09/2015   K 4.2 10/06/2018 0240   CL 106 10/06/2018 0240   CO2 24 10/06/2018 0240   GLUCOSE 109 (H) 10/06/2018 0240   BUN 8 10/06/2018 0240   BUN 14 12/09/2015   CREATININE 0.77 10/06/2018 0240   CALCIUM 9.3 10/06/2018 0240   PROT 7.0 10/06/2018 0008   ALBUMIN 3.5 10/06/2018 0008   AST 23 10/06/2018 0008   ALT 23 10/06/2018 0008   ALKPHOS 65 10/06/2018 0008   BILITOT 0.4 10/06/2018 0008   GFRNONAA >60 10/06/2018 0240   GFRAA >60 10/06/2018 0240   Lab Results  Component Value Date   CHOL 153 12/02/2015   HDL 41 12/02/2015   LDLCALC 101 (H) 12/02/2015   TRIG 57 12/02/2015   CHOLHDL 3.7 12/02/2015   No results found for: HGBA1C No results found for: VITAMINB12 Lab Results  Component Value Date   TSH 1.98 12/09/2015      ASSESSMENT AND PLAN 78 y.o. year old female  has a past medical history of CAD (coronary artery disease) (1997 ), Cervical spine fracture (HCC), Chest pain, Constipation, Foot drop, left, Gait disturbance, Headache(784.0) (03/14/2013), Heart attack (HCC), Hemiparesis and alteration of sensations as late effects of stroke (HCC) (03/14/2013), Hypercholesterolemia, Hypertension, Osteoporosis, Palpitations, Periorbital hematoma of left eye, Peripheral vascular disease (HCC), Peroneal DVT (deep venous thrombosis), Post-menopausal, PVD (peripheral vascular disease) (HCC), Seizures (HCC), Stroke (HCC), Stroke (HCC) (2010), and Urinary incontinence. here with:  1.  History of stroke 2.  Seizures  Overall the patient has remained stable.  She will continue on aspirin and Plavix.  She is encouraged to maintain strict control of her blood pressure with goal less than 130/90 unless otherwise specified by her PCP.  She should keep good control of her cholesterol with LDL less than 70.  She will remain on Lamictal 100 mg twice a day.  She is advised if she has any seizure event she should let us know.  She will follow-up in 1 year or sooner if needed.    Butch PennyMegan Reyli Schroth, MSN, NP-C 07/25/2019, 8:24 AM [facility] [address], [address] [place], [place] [id number] [phone]

## 2019-08-18 DIAGNOSIS — I69354 Hemiplegia and hemiparesis following cerebral infarction affecting left non-dominant side: Secondary | ICD-10-CM | POA: Diagnosis not present

## 2019-08-18 DIAGNOSIS — I10 Essential (primary) hypertension: Secondary | ICD-10-CM | POA: Diagnosis not present

## 2019-08-18 DIAGNOSIS — H6123 Impacted cerumen, bilateral: Secondary | ICD-10-CM | POA: Diagnosis not present

## 2019-12-15 DIAGNOSIS — I739 Peripheral vascular disease, unspecified: Secondary | ICD-10-CM | POA: Diagnosis not present

## 2019-12-15 DIAGNOSIS — I2510 Atherosclerotic heart disease of native coronary artery without angina pectoris: Secondary | ICD-10-CM | POA: Diagnosis not present

## 2019-12-15 DIAGNOSIS — I69354 Hemiplegia and hemiparesis following cerebral infarction affecting left non-dominant side: Secondary | ICD-10-CM | POA: Diagnosis not present

## 2019-12-15 DIAGNOSIS — E785 Hyperlipidemia, unspecified: Secondary | ICD-10-CM | POA: Diagnosis not present

## 2019-12-15 DIAGNOSIS — I10 Essential (primary) hypertension: Secondary | ICD-10-CM | POA: Diagnosis not present

## 2019-12-20 ENCOUNTER — Other Ambulatory Visit: Payer: Self-pay | Admitting: *Deleted

## 2019-12-20 DIAGNOSIS — H26493 Other secondary cataract, bilateral: Secondary | ICD-10-CM | POA: Diagnosis not present

## 2019-12-20 DIAGNOSIS — Z961 Presence of intraocular lens: Secondary | ICD-10-CM | POA: Diagnosis not present

## 2019-12-20 MED ORDER — LAMOTRIGINE 100 MG PO TABS: ORAL_TABLET | ORAL | 3 refills | Status: DC

## 2020-01-06 ENCOUNTER — Ambulatory Visit: Payer: Medicare PPO | Attending: Internal Medicine

## 2020-01-06 DIAGNOSIS — Z23 Encounter for immunization: Secondary | ICD-10-CM

## 2020-01-06 NOTE — Progress Notes (Signed)
   Covid-19 Vaccination Clinic  Name:  [name] [name]    MRN: [id number] DOB: 1941/05/12  01/06/2020  [name] [name] was observed post Covid-19 immunization for 15 minutes without incident. She was provided with Vaccine Information Sheet and instruction to access the V-Safe system.   [name] [name] was instructed to call 911 with any severe reactions post vaccine: Marland Kitchen Difficulty breathing  . Swelling of face and throat  . A fast heartbeat  . A bad rash all over body  . Dizziness and weakness   Immunizations Administered    Name Date Dose VIS Date Route   Moderna COVID-19 Vaccine 01/06/2020 11:38 AM 0.5 mL 08/2019 Intramuscular   Manufacturer: Moderna   Lot: [id number]   NDC: 96283-662-94

## 2020-01-12 DIAGNOSIS — H02831 Dermatochalasis of right upper eyelid: Secondary | ICD-10-CM | POA: Diagnosis not present

## 2020-01-12 DIAGNOSIS — H26492 Other secondary cataract, left eye: Secondary | ICD-10-CM | POA: Diagnosis not present

## 2020-01-12 DIAGNOSIS — H26493 Other secondary cataract, bilateral: Secondary | ICD-10-CM | POA: Diagnosis not present

## 2020-01-12 DIAGNOSIS — Z961 Presence of intraocular lens: Secondary | ICD-10-CM | POA: Diagnosis not present

## 2020-01-12 DIAGNOSIS — H02834 Dermatochalasis of left upper eyelid: Secondary | ICD-10-CM | POA: Diagnosis not present

## 2020-01-19 DIAGNOSIS — I10 Essential (primary) hypertension: Secondary | ICD-10-CM | POA: Diagnosis not present

## 2020-01-19 DIAGNOSIS — N611 Abscess of the breast and nipple: Secondary | ICD-10-CM | POA: Diagnosis not present

## 2020-01-19 DIAGNOSIS — R319 Hematuria, unspecified: Secondary | ICD-10-CM | POA: Diagnosis not present

## 2020-01-24 DIAGNOSIS — Z9842 Cataract extraction status, left eye: Secondary | ICD-10-CM | POA: Diagnosis not present

## 2020-02-09 DIAGNOSIS — H26491 Other secondary cataract, right eye: Secondary | ICD-10-CM | POA: Diagnosis not present

## 2020-02-19 DIAGNOSIS — H2511 Age-related nuclear cataract, right eye: Secondary | ICD-10-CM | POA: Diagnosis not present

## 2020-06-28 DIAGNOSIS — I25118 Atherosclerotic heart disease of native coronary artery with other forms of angina pectoris: Secondary | ICD-10-CM | POA: Diagnosis not present

## 2020-06-28 DIAGNOSIS — E785 Hyperlipidemia, unspecified: Secondary | ICD-10-CM | POA: Diagnosis not present

## 2020-06-28 DIAGNOSIS — I69354 Hemiplegia and hemiparesis following cerebral infarction affecting left non-dominant side: Secondary | ICD-10-CM | POA: Diagnosis not present

## 2020-06-28 DIAGNOSIS — I739 Peripheral vascular disease, unspecified: Secondary | ICD-10-CM | POA: Diagnosis not present

## 2020-06-28 DIAGNOSIS — M810 Age-related osteoporosis without current pathological fracture: Secondary | ICD-10-CM | POA: Diagnosis not present

## 2020-06-28 DIAGNOSIS — Z0000 Encounter for general adult medical examination without abnormal findings: Secondary | ICD-10-CM | POA: Diagnosis not present

## 2020-06-28 DIAGNOSIS — G40309 Generalized idiopathic epilepsy and epileptic syndromes, not intractable, without status epilepticus: Secondary | ICD-10-CM | POA: Diagnosis not present

## 2020-06-28 DIAGNOSIS — I10 Essential (primary) hypertension: Secondary | ICD-10-CM | POA: Diagnosis not present

## 2020-06-28 DIAGNOSIS — Z1389 Encounter for screening for other disorder: Secondary | ICD-10-CM | POA: Diagnosis not present

## 2020-07-11 ENCOUNTER — Other Ambulatory Visit: Payer: Self-pay | Admitting: Family Medicine

## 2020-07-11 DIAGNOSIS — M810 Age-related osteoporosis without current pathological fracture: Secondary | ICD-10-CM

## 2020-07-21 NOTE — Patient Instructions (Signed)
Your Plan: ? ?Continue ? ? ? ? ?Thank you for coming to see us at [facility]. I hope we have been able to provide you high quality care today. ? ?You may receive a patient satisfaction survey over the next few weeks. We would appreciate your feedback and comments so that we may continue to improve ourselves and the health of our patients. ? ?

## 2020-07-21 NOTE — Progress Notes (Deleted)
PATIENT: [name] [name] DOB: 12/05/1940  REASON FOR VISIT: follow up HISTORY FROM: patient  HISTORY OF PRESENT ILLNESS: Today 07/21/20:  HISTORY 07/25/19:  [name] [name] is a 79 year old female with a history of stroke in December 2010 with left hemiparesis and history of seizures.  She returns today for follow-up.  She denies any strokelike symptoms.  She reports her blood pressure has been elevated and her PCP recently increase metoprolol to 100 mg.  She remains on Plavix and aspirin.  She denies any seizure events.  She continues on Lamictal 100 mg twice a day.  She lives at home with her daughter.  She is able to complete most ADLs independently her daughter does have to help her get dressed.  She returns today for an evaluation.    REVIEW OF SYSTEMS: Out of a complete 14 system review of symptoms, the patient complains only of the following symptoms, and all other reviewed systems are negative.  ALLERGIES: Allergies  Allergen Reactions  . Latex Rash  . Penicillins Rash    Has patient had a PCN reaction causing immediate rash, facial/tongue/throat swelling, SOB or lightheadedness with hypotension: No Has patient had a PCN reaction causing severe rash involving mucus membranes or skin necrosis: No Has patient had a PCN reaction that required hospitalization No Has patient had a PCN reaction occurring within the last 10 years: No If all of the above answers are "NO", then may proceed with Cephalosporin use.  . Tape Rash    HOME MEDICATIONS: Outpatient Medications Prior to Visit  Medication Sig Dispense Refill  . acetaminophen (TYLENOL) 500 MG tablet Take 1-2 tablets (500-1,000 mg total) by mouth every 8 (eight) hours as needed for mild pain. 90 tablet 0  . aspirin EC 81 MG tablet Take 1 tablet (81 mg total) by mouth daily. With Breakfast 120 tablet 3  . atorvastatin (LIPITOR) 20 MG tablet Take 1 tablet (20 mg total) by mouth every evening. 30 tablet 5  . cholecalciferol (VITAMIN  D) 1000 units tablet Take 1 tablet (1,000 Units total) by mouth daily. 30 tablet 5  . clopidogrel (PLAVIX) 75 MG tablet Take 1 tablet (75 mg total) by mouth daily. 30 tablet 5  . lamoTRIgine (LAMICTAL) 100 MG tablet TAKE 1 TABLET(100 MG) BY MOUTH TWICE DAILY 180 tablet 3  . lisinopril (PRINIVIL,ZESTRIL) 20 MG tablet Take 1 tablet (20 mg total) by mouth at bedtime. 30 tablet 5  . metoprolol succinate (TOPROL-XL) 50 MG 24 hr tablet Take 1 tablet (50 mg total) by mouth daily. Take with or immediately following a meal. (Patient taking differently: Take 100 mg by mouth daily. Take with or immediately following a meal.) 30 tablet 5  . nitroGLYCERIN (NITROSTAT) 0.4 MG SL tablet DISSOLVE 1 TABLET UNDER THE TONGUE EVERY 5 MINUTES AS NEEDED FOR CHEST PAIN OR SHORTNESS OF BREATH (Patient taking differently: Place 0.4 mg under the tongue every 5 (five) minutes as needed for chest pain. ) 75 tablet 0  . polyethylene glycol (MIRALAX / GLYCOLAX) packet Take 17 g by mouth daily as needed for mild constipation.     . polyethylene glycol (MIRALAX / GLYCOLAX) packet Take 17 g by mouth daily. 30 each 5  . senna-docusate (SENOKOT-S) 8.6-50 MG tablet Take 2 tablets by mouth at bedtime. 60 tablet 5   No facility-administered medications prior to visit.    PAST MEDICAL HISTORY: Past Medical History:  Diagnosis Date  . CAD (coronary artery disease) 1997    with CABG  .  Cervical spine fracture [facility])    Not requiring surgery  . Chest pain   . Constipation   . Foot drop, left   . Gait disturbance    quad cane  . Headache(784.0) 03/14/2013  . Heart attack (HCC)   . Hemiparesis and alteration of sensations as late effects of stroke (HCC) 03/14/2013   left  . Hypercholesterolemia   . Hypertension   . Osteoporosis   . Palpitations   . Periorbital hematoma of left eye   . Peripheral vascular disease (HCC)    Left superficial femoral artery  . Peroneal DVT (deep venous thrombosis)   . Post-menopausal   . PVD  (peripheral vascular disease) (HCC)   . Seizures (HCC)   . Stroke (HCC)   . Stroke (HCC) 2010  . Urinary incontinence     PAST SURGICAL HISTORY: Past Surgical History:  Procedure Laterality Date  . CATARACT EXTRACTION    . CORONARY ARTERY BYPASS GRAFT     x3    FAMILY HISTORY: Family History  Problem Relation Age of Onset  . Heart attack Mother   . Heart attack Father   . Heart attack Sister   . Heart attack Brother     SOCIAL HISTORY: Social History   Socioeconomic History  . Marital status: Widowed    Spouse name: Not on file  . Number of children: 5  . Years of education: 10  . Highest education level: Not on file  Occupational History  . Occupation: Retired  Tobacco Use  . Smoking status: Never Smoker  . Smokeless tobacco: Never Used  Vaping Use  . Vaping Use: Never used  Substance and Sexual Activity  . Alcohol use: No  . Drug use: No  . Sexual activity: Not on file  Other Topics Concern  . Not on file  Social History Narrative   Patient is a widowed and lives with her daughter [name] [name]).   Patient has three living children and two are deceased.   Patient is retired.   Patient has a 10th grade education.   Patient is right-handed.   Patient does not drink caffeine.      Social Determinants of Health   Financial Resource Strain:   . Difficulty of Paying Living Expenses: Not on file  Food Insecurity:   . Worried About Programme researcher, broadcasting/film/video in the Last Year: Not on file  . Ran Out of Food in the Last Year: Not on file  Transportation Needs:   . Lack of Transportation (Medical): Not on file  . Lack of Transportation (Non-Medical): Not on file  Physical Activity:   . Days of Exercise per Week: Not on file  . Minutes of Exercise per Session: Not on file  Stress:   . Feeling of Stress : Not on file  Social Connections:   . Frequency of Communication with Friends and Family: Not on file  . Frequency of Social Gatherings with Friends and Family: Not on  file  . Attends Religious Services: Not on file  . Active Member of Clubs or Organizations: Not on file  . Attends Banker Meetings: Not on file  . Marital Status: Not on file  Intimate Partner Violence:   . Fear of Current or Ex-Partner: Not on file  . Emotionally Abused: Not on file  . Physically Abused: Not on file  . Sexually Abused: Not on file      PHYSICAL EXAM  There were no vitals filed for this visit. There is no  height or weight on file to calculate BMI.  Generalized: Well developed, in no acute distress   Neurological examination  Mentation: Alert oriented to time, place, history taking. Follows all commands speech and language fluent Cranial nerve II-XII: Pupils were equal round reactive to light. Extraocular movements were full, visual field were full on confrontational test. Facial sensation and strength were normal. Uvula tongue midline. Head turning and shoulder shrug  were normal and symmetric. Motor: The motor testing reveals 5 over 5 strength of all 4 extremities. Good symmetric motor tone is noted throughout.  Sensory: Sensory testing is intact to soft touch on all 4 extremities. No evidence of extinction is noted.  Coordination: Cerebellar testing reveals good finger-nose-finger and heel-to-shin bilaterally.  Gait and station: Gait is normal. Tandem gait is normal. Romberg is negative. No drift is seen.  Reflexes: Deep tendon reflexes are symmetric and normal bilaterally.   DIAGNOSTIC DATA (LABS, IMAGING, TESTING) - I reviewed patient records, labs, notes, testing and imaging myself where available.  Lab Results  Component Value Date   WBC 7.8 10/06/2018   HGB 12.4 10/06/2018   HCT 40.3 10/06/2018   MCV 91.0 10/06/2018   PLT 213 10/06/2018      Component Value Date/Time   NA 141 10/06/2018 0240   NA 141 12/09/2015 0000   K 4.2 10/06/2018 0240   CL 106 10/06/2018 0240   CO2 24 10/06/2018 0240   GLUCOSE 109 (H) 10/06/2018 0240   BUN 8  10/06/2018 0240   BUN 14 12/09/2015 0000   CREATININE 0.77 10/06/2018 0240   CALCIUM 9.3 10/06/2018 0240   PROT 7.0 10/06/2018 0008   ALBUMIN 3.5 10/06/2018 0008   AST 23 10/06/2018 0008   ALT 23 10/06/2018 0008   ALKPHOS 65 10/06/2018 0008   BILITOT 0.4 10/06/2018 0008   GFRNONAA >60 10/06/2018 0240   GFRAA >60 10/06/2018 0240   Lab Results  Component Value Date   CHOL 153 12/02/2015   HDL 41 12/02/2015   LDLCALC 101 (H) 12/02/2015   TRIG 57 12/02/2015   CHOLHDL 3.7 12/02/2015   No results found for: HGBA1C No results found for: VITAMINB12 Lab Results  Component Value Date   TSH 1.98 12/09/2015      ASSESSMENT AND PLAN 79 y.o. year old female  has a past medical history of CAD (coronary artery disease) (1997 ), Cervical spine fracture (HCC), Chest pain, Constipation, Foot drop, left, Gait disturbance, Headache(784.0) (03/14/2013), Heart attack (HCC), Hemiparesis and alteration of sensations as late effects of stroke (HCC) (03/14/2013), Hypercholesterolemia, Hypertension, Osteoporosis, Palpitations, Periorbital hematoma of left eye, Peripheral vascular disease (HCC), Peroneal DVT (deep venous thrombosis), Post-menopausal, PVD (peripheral vascular disease) (HCC), Seizures (HCC), Stroke (HCC), Stroke (HCC) (2010), and Urinary incontinence. here with ***   I spent *** minutes of face-to-face and non-face-to-face time with patient.  This included previsit chart review, lab review, study review, order entry, electronic health record documentation, patient education.  Butch Penny, MSN, NP-C 07/21/2020, 8:42 AM [facility] [address] [place], [place] [address] [phone]   This encounter was created in error - please disregard.

## 2020-07-22 ENCOUNTER — Telehealth: Payer: Self-pay | Admitting: Adult Health

## 2020-07-22 ENCOUNTER — Encounter: Payer: Medicare PPO | Admitting: Adult Health

## 2020-07-22 DIAGNOSIS — G40909 Epilepsy, unspecified, not intractable, without status epilepticus: Secondary | ICD-10-CM

## 2020-07-22 DIAGNOSIS — Z8673 Personal history of transient ischemic attack (TIA), and cerebral infarction without residual deficits: Secondary | ICD-10-CM

## 2020-07-22 NOTE — Telephone Encounter (Signed)
Last seen VV, hx stroke, seizures.

## 2020-07-22 NOTE — Progress Notes (Signed)
Patient rescheduled for a MyChart visit.  She was unable to get her camera to work.  I offered a telephone visit but they prefer to reschedule.

## 2020-07-22 NOTE — Telephone Encounter (Signed)
..   Pt understands that although there may be some limitations with this type of visit, we will take all precautions to reduce any security or privacy concerns.  Pt understands that this will be treated like an in office visit and we will file with pt's insurance, and there may be a patient responsible charge related to this service. ? ?

## 2020-09-27 DIAGNOSIS — I10 Essential (primary) hypertension: Secondary | ICD-10-CM | POA: Diagnosis not present

## 2020-09-27 DIAGNOSIS — M810 Age-related osteoporosis without current pathological fracture: Secondary | ICD-10-CM | POA: Diagnosis not present

## 2020-09-27 DIAGNOSIS — E785 Hyperlipidemia, unspecified: Secondary | ICD-10-CM | POA: Diagnosis not present

## 2020-12-06 ENCOUNTER — Other Ambulatory Visit: Payer: Medicare PPO

## 2020-12-08 ENCOUNTER — Other Ambulatory Visit: Payer: Self-pay | Admitting: Adult Health

## 2020-12-11 ENCOUNTER — Telehealth: Payer: Self-pay | Admitting: Adult Health

## 2020-12-11 MED ORDER — LAMOTRIGINE 100 MG PO TABS: ORAL_TABLET | ORAL | 3 refills | Status: DC

## 2020-12-11 NOTE — Telephone Encounter (Signed)
Refill sent for 1 month the patient has not been seen since 2020.  It appears that she had a MyChart visit in November 2021 but did not show up for this.  She needs a follow-up appointment for further refills

## 2020-12-11 NOTE — Telephone Encounter (Signed)
LVM letting pt know medication was refilled for one month but she needs an office visit to continue refills. Asked pt to call back.

## 2020-12-11 NOTE — Telephone Encounter (Signed)
Pt's daughter [name], on [place] called to make an appt for the pt but when offered one on 4/21 [name] declined due to her having an appt on that date herself. Next avail is in Aug and daughter does not want to wait that [place]. Please advise.

## 2020-12-11 NOTE — Telephone Encounter (Signed)
Spoke to daughter, made appt for pt.  She has been doing ok.  Refilled one month, then placed on file additional script for 3.  appt made 04-21-21 next available.

## 2020-12-11 NOTE — Telephone Encounter (Signed)
I placed on cancellation list for megan and will be glad to call is opening comes up.

## 2021-01-10 ENCOUNTER — Other Ambulatory Visit: Payer: Medicare PPO

## 2021-01-13 ENCOUNTER — Other Ambulatory Visit: Payer: Self-pay | Admitting: Adult Health

## 2021-02-13 ENCOUNTER — Ambulatory Visit: Payer: Self-pay | Admitting: Adult Health

## 2021-04-21 ENCOUNTER — Encounter: Payer: Self-pay | Admitting: Adult Health

## 2021-04-21 ENCOUNTER — Ambulatory Visit: Payer: Self-pay | Admitting: Adult Health

## 2021-04-30 ENCOUNTER — Other Ambulatory Visit: Payer: Self-pay | Admitting: Adult Health

## 2021-06-05 ENCOUNTER — Other Ambulatory Visit: Payer: Self-pay | Admitting: Adult Health

## 2021-06-20 ENCOUNTER — Ambulatory Visit
Admission: RE | Admit: 2021-06-20 | Discharge: 2021-06-20 | Disposition: A | Discharge status: Home / Self Care | Payer: Medicare PPO | Source: Ambulatory Visit | Attending: Family Medicine | Admitting: Family Medicine

## 2021-06-20 ENCOUNTER — Other Ambulatory Visit: Payer: Self-pay

## 2021-06-20 DIAGNOSIS — M85851 Other specified disorders of bone density and structure, right thigh: Secondary | ICD-10-CM | POA: Diagnosis not present

## 2021-06-20 DIAGNOSIS — M810 Age-related osteoporosis without current pathological fracture: Secondary | ICD-10-CM

## 2021-06-20 DIAGNOSIS — Z780 Asymptomatic menopausal state: Secondary | ICD-10-CM | POA: Diagnosis not present

## 2021-07-02 DIAGNOSIS — I69354 Hemiplegia and hemiparesis following cerebral infarction affecting left non-dominant side: Secondary | ICD-10-CM | POA: Diagnosis not present

## 2021-07-02 DIAGNOSIS — H6120 Impacted cerumen, unspecified ear: Secondary | ICD-10-CM | POA: Diagnosis not present

## 2021-07-02 DIAGNOSIS — H9190 Unspecified hearing loss, unspecified ear: Secondary | ICD-10-CM | POA: Diagnosis not present

## 2021-07-02 DIAGNOSIS — E785 Hyperlipidemia, unspecified: Secondary | ICD-10-CM | POA: Diagnosis not present

## 2021-07-02 DIAGNOSIS — I739 Peripheral vascular disease, unspecified: Secondary | ICD-10-CM | POA: Diagnosis not present

## 2021-07-02 DIAGNOSIS — I10 Essential (primary) hypertension: Secondary | ICD-10-CM | POA: Diagnosis not present

## 2021-07-02 DIAGNOSIS — Z0000 Encounter for general adult medical examination without abnormal findings: Secondary | ICD-10-CM | POA: Diagnosis not present

## 2021-07-02 DIAGNOSIS — M810 Age-related osteoporosis without current pathological fracture: Secondary | ICD-10-CM | POA: Diagnosis not present

## 2021-07-02 DIAGNOSIS — Z1389 Encounter for screening for other disorder: Secondary | ICD-10-CM | POA: Diagnosis not present

## 2021-09-25 ENCOUNTER — Ambulatory Visit: Payer: Medicare PPO | Admitting: Adult Health

## 2021-10-02 ENCOUNTER — Telehealth: Payer: Medicare PPO | Admitting: Adult Health

## 2021-10-02 ENCOUNTER — Telehealth: Payer: Self-pay | Admitting: Adult Health

## 2021-10-02 DIAGNOSIS — G40909 Epilepsy, unspecified, not intractable, without status epilepticus: Secondary | ICD-10-CM | POA: Diagnosis not present

## 2021-10-02 NOTE — Progress Notes (Signed)
PATIENT: [name] [name] DOB: [birth year]  REASON FOR VISIT: follow up HISTORY FROM: patient  Virtual Visit via Video Note  I connected with [name] [name] on 10/02/21 at  2:00 PM EST by a video enabled telemedicine application located remotely at [facility] and verified that I am speaking with the correct person using two identifiers who was located at their own home.   I discussed the limitations of evaluation and management by telemedicine and the availability of in person appointments. The patient expressed understanding and agreed to proceed.   PATIENT: [name] [name] DOB: [birth year]  REASON FOR VISIT: follow up HISTORY FROM: patient  HISTORY OF PRESENT ILLNESS: Today 10/02/21:  Ms. [name] [name] is a 81 year old female with a history of stroke and seizures.  She returns today for follow-up.  She denies any seizure events.  She continues on Lamictal 100 mg twice a day.  She denies any new symptoms.  Her PCP is managing stroke risk factors.  She returns today for evaluation.  HISTORY 07/25/19:   Ms. [name] [name] is a 81 year old female with a history of stroke in December 2010 with left hemiparesis and history of seizures.  She returns today for follow-up.  She denies any strokelike symptoms.  She reports her blood pressure has been elevated and her PCP recently increase metoprolol to 100 mg.  She remains on Plavix and aspirin.  She denies any seizure events.  She continues on Lamictal 100 mg twice a day.  She lives at home with her daughter.  She is able to complete most ADLs independently her daughter does have to help her get dressed.  She returns today for an evaluation.    REVIEW OF SYSTEMS: Out of a complete 14 system review of symptoms, the patient complains only of the following symptoms, and all other reviewed systems are negative.  ALLERGIES: Allergies  Allergen Reactions   Latex Rash   Penicillins Rash    Has patient had a PCN reaction causing immediate rash,  facial/tongue/throat swelling, SOB or lightheadedness with hypotension: No Has patient had a PCN reaction causing severe rash involving mucus membranes or skin necrosis: No Has patient had a PCN reaction that required hospitalization No Has patient had a PCN reaction occurring within the last 10 years: No If all of the above answers are "NO", then may proceed with Cephalosporin use.   Tape Rash    HOME MEDICATIONS: Outpatient Medications Prior to Visit  Medication Sig Dispense Refill   acetaminophen (TYLENOL) 500 MG tablet Take 1-2 tablets (500-1,000 mg total) by mouth every 8 (eight) hours as needed for mild pain. 90 tablet 0   aspirin EC 81 MG tablet Take 1 tablet (81 mg total) by mouth daily. With Breakfast 120 tablet 3   atorvastatin (LIPITOR) 20 MG tablet Take 1 tablet (20 mg total) by mouth every evening. 30 tablet 5   cholecalciferol (VITAMIN D) 1000 units tablet Take 1 tablet (1,000 Units total) by mouth daily. 30 tablet 5   clopidogrel (PLAVIX) 75 MG tablet Take 1 tablet (75 mg total) by mouth daily. 30 tablet 5   lamoTRIgine (LAMICTAL) 100 MG tablet TAKE 1 TABLET BY MOUTH TWICE DAILY 60 tablet 4   lisinopril (PRINIVIL,ZESTRIL) 20 MG tablet Take 1 tablet (20 mg total) by mouth at bedtime. 30 tablet 5   metoprolol succinate (TOPROL-XL) 50 MG 24 hr tablet Take 1 tablet (50 mg total) by mouth daily. Take with or immediately following a meal. (Patient taking differently: Take  100 mg by mouth daily. Take with or immediately following a meal.) 30 tablet 5   nitroGLYCERIN (NITROSTAT) 0.4 MG SL tablet DISSOLVE 1 TABLET UNDER THE TONGUE EVERY 5 MINUTES AS NEEDED FOR CHEST PAIN OR SHORTNESS OF BREATH (Patient taking differently: Place 0.4 mg under the tongue every 5 (five) minutes as needed for chest pain. ) 75 tablet 0   polyethylene glycol (MIRALAX / GLYCOLAX) packet Take 17 g by mouth daily as needed for mild constipation.      polyethylene glycol (MIRALAX / GLYCOLAX) packet Take 17 g by mouth  daily. 30 each 5   senna-docusate (SENOKOT-S) 8.6-50 MG tablet Take 2 tablets by mouth at bedtime. 60 tablet 5   No facility-administered medications prior to visit.    PAST MEDICAL HISTORY: Past Medical History:  Diagnosis Date   CAD (coronary artery disease) 1997    with CABG   Cervical spine fracture [name])    Not requiring surgery   Chest pain    Constipation    Foot drop, left    Gait disturbance    quad cane   Headache(784.0) 03/14/2013   Heart attack (HCC)    Hemiparesis and alteration of sensations as late effects of stroke (HCC) 03/14/2013   left   Hypercholesterolemia    Hypertension    Osteoporosis    Palpitations    Periorbital hematoma of left eye    Peripheral vascular disease (HCC)    Left superficial femoral artery   Peroneal DVT (deep venous thrombosis)    Post-menopausal    PVD (peripheral vascular disease) (HCC)    Seizures (HCC)    Stroke (HCC)    Stroke (HCC) 2010   Urinary incontinence     PAST SURGICAL HISTORY: Past Surgical History:  Procedure Laterality Date   CATARACT EXTRACTION     CORONARY ARTERY BYPASS GRAFT     x3    FAMILY HISTORY: Family History  Problem Relation Age of Onset   Heart attack Mother    Heart attack Father    Heart attack Sister    Heart attack Brother     SOCIAL HISTORY: Social History   Socioeconomic History   Marital status: Widowed    Spouse name: Not on file   Number of children: 5   Years of education: 10   Highest education level: Not on file  Occupational History   Occupation: Retired  Tobacco Use   Smoking status: Never   Smokeless tobacco: Never  Vaping Use   Vaping Use: Never used  Substance and Sexual Activity   Alcohol use: No   Drug use: No   Sexual activity: Not on file  Other Topics Concern   Not on file  Social History Narrative   Patient is a widowed and lives with her daughter [name].   Patient has three living children and two are deceased.   Patient is retired.   Patient  has a 10th grade education.   Patient is right-handed.   Patient does not drink caffeine.      Social Determinants of Health   Financial Resource Strain: Not on file  Food Insecurity: Not on file  Transportation Needs: Not on file  Physical Activity: Not on file  Stress: Not on file  Social Connections: Not on file  Intimate Partner Violence: Not on file      PHYSICAL EXAM Generalized: Well developed, in no acute distress   Neurological examination  Mentation: Alert oriented to time, place, history taking. Follows all commands speech  and language fluent Cranial nerve II-XII:Extraocular movements were full. Facial symmetry noted. uvula tongue midline. Head turning and shoulder shrug  were normal and symmetric. Motor: Good strength throughout subjectively per patient Sensory: Sensory testing is intact to soft touch on all 4 extremities subjectively per patient Coordination: Cerebellar testing reveals good finger-nose-finger  Gait and station: Patient is able to stand from a seated position. gait is normal.  Reflexes: UTA  DIAGNOSTIC DATA (LABS, IMAGING, TESTING) - I reviewed patient records, labs, notes, testing and imaging myself where available.  Lab Results  Component Value Date   WBC 7.8 10/06/2018   HGB 12.4 10/06/2018   HCT 40.3 10/06/2018   MCV 91.0 10/06/2018   PLT 213 10/06/2018      Component Value Date/Time   NA 141 10/06/2018 0240   NA 141 12/09/2015 0000   K 4.2 10/06/2018 0240   CL 106 10/06/2018 0240   CO2 24 10/06/2018 0240   GLUCOSE 109 (H) 10/06/2018 0240   BUN 8 10/06/2018 0240   BUN 14 12/09/2015 0000   CREATININE 0.77 10/06/2018 0240   CALCIUM 9.3 10/06/2018 0240   PROT 7.0 10/06/2018 0008   ALBUMIN 3.5 10/06/2018 0008   AST 23 10/06/2018 0008   ALT 23 10/06/2018 0008   ALKPHOS 65 10/06/2018 0008   BILITOT 0.4 10/06/2018 0008   GFRNONAA >60 10/06/2018 0240   GFRAA >60 10/06/2018 0240   Lab Results  Component Value Date   CHOL 153  12/02/2015   HDL 41 12/02/2015   LDLCALC 101 (H) 12/02/2015   TRIG 57 12/02/2015   CHOLHDL 3.7 12/02/2015   No results found for: HGBA1C No results found for: VITAMINB12 Lab Results  Component Value Date   TSH 1.98 12/09/2015      ASSESSMENT AND PLAN 81 y.o. year old female  has a past medical history of CAD (coronary artery disease) (1997 ), Cervical spine fracture (HCC), Chest pain, Constipation, Foot drop, left, Gait disturbance, Headache(784.0) (03/14/2013), Heart attack (HCC), Hemiparesis and alteration of sensations as late effects of stroke (HCC) (03/14/2013), Hypercholesterolemia, Hypertension, Osteoporosis, Palpitations, Periorbital hematoma of left eye, Peripheral vascular disease (HCC), Peroneal DVT (deep venous thrombosis), Post-menopausal, PVD (peripheral vascular disease) (HCC), Seizures (HCC), Stroke (HCC), Stroke (HCC) (2010), and Urinary incontinence. here with:  1.  Seizures  Continue Lamictal 100 mg twice a day Advise if she has any seizure events to let us know Follow-up in 1 year or sooner if needed     Butch Penny, MSN, NP-C 10/02/2021, 2:06 PM [facility] [address], [address] [place], [place] [address] [phone]

## 2021-10-02 NOTE — Telephone Encounter (Signed)
..   Pt understands that although there may be some limitations with this type of visit, we will take all precautions to reduce any security or privacy concerns.  Pt understands that this will be treated like an in office visit and we will file with pt's insurance, and there may be a patient responsible charge related to this service. ? ?

## 2021-11-16 ENCOUNTER — Other Ambulatory Visit: Payer: Self-pay | Admitting: Adult Health

## 2021-12-30 DIAGNOSIS — I10 Essential (primary) hypertension: Secondary | ICD-10-CM | POA: Diagnosis not present

## 2021-12-30 DIAGNOSIS — I69354 Hemiplegia and hemiparesis following cerebral infarction affecting left non-dominant side: Secondary | ICD-10-CM | POA: Diagnosis not present

## 2021-12-30 DIAGNOSIS — E785 Hyperlipidemia, unspecified: Secondary | ICD-10-CM | POA: Diagnosis not present

## 2021-12-30 DIAGNOSIS — G40309 Generalized idiopathic epilepsy and epileptic syndromes, not intractable, without status epilepticus: Secondary | ICD-10-CM | POA: Diagnosis not present

## 2021-12-30 DIAGNOSIS — E441 Mild protein-calorie malnutrition: Secondary | ICD-10-CM | POA: Diagnosis not present

## 2021-12-30 DIAGNOSIS — I25118 Atherosclerotic heart disease of native coronary artery with other forms of angina pectoris: Secondary | ICD-10-CM | POA: Diagnosis not present

## 2021-12-30 DIAGNOSIS — I739 Peripheral vascular disease, unspecified: Secondary | ICD-10-CM | POA: Diagnosis not present

## 2022-01-09 DIAGNOSIS — N390 Urinary tract infection, site not specified: Secondary | ICD-10-CM | POA: Diagnosis not present

## 2022-04-21 ENCOUNTER — Other Ambulatory Visit: Payer: Self-pay | Admitting: Adult Health

## 2022-07-07 DIAGNOSIS — I739 Peripheral vascular disease, unspecified: Secondary | ICD-10-CM | POA: Diagnosis not present

## 2022-07-07 DIAGNOSIS — G40309 Generalized idiopathic epilepsy and epileptic syndromes, not intractable, without status epilepticus: Secondary | ICD-10-CM | POA: Diagnosis not present

## 2022-07-07 DIAGNOSIS — K5900 Constipation, unspecified: Secondary | ICD-10-CM | POA: Diagnosis not present

## 2022-07-07 DIAGNOSIS — M810 Age-related osteoporosis without current pathological fracture: Secondary | ICD-10-CM | POA: Diagnosis not present

## 2022-07-07 DIAGNOSIS — Z0000 Encounter for general adult medical examination without abnormal findings: Secondary | ICD-10-CM | POA: Diagnosis not present

## 2022-07-07 DIAGNOSIS — I69354 Hemiplegia and hemiparesis following cerebral infarction affecting left non-dominant side: Secondary | ICD-10-CM | POA: Diagnosis not present

## 2022-07-07 DIAGNOSIS — I10 Essential (primary) hypertension: Secondary | ICD-10-CM | POA: Diagnosis not present

## 2022-07-07 DIAGNOSIS — H6123 Impacted cerumen, bilateral: Secondary | ICD-10-CM | POA: Diagnosis not present

## 2022-07-07 DIAGNOSIS — E785 Hyperlipidemia, unspecified: Secondary | ICD-10-CM | POA: Diagnosis not present

## 2022-09-15 ENCOUNTER — Other Ambulatory Visit: Payer: Self-pay

## 2022-09-15 ENCOUNTER — Observation Stay (HOSPITAL_COMMUNITY)
Admission: EM | Admit: 2022-09-15 | Discharge: 2022-09-17 | Disposition: A | Discharge status: Home Health | Payer: Medicare PPO | Attending: Internal Medicine | Admitting: Internal Medicine

## 2022-09-15 ENCOUNTER — Emergency Department (HOSPITAL_BASED_OUTPATIENT_CLINIC_OR_DEPARTMENT_OTHER): Payer: Medicare PPO

## 2022-09-15 ENCOUNTER — Encounter (HOSPITAL_COMMUNITY): Payer: Self-pay

## 2022-09-15 DIAGNOSIS — Z951 Presence of aortocoronary bypass graft: Secondary | ICD-10-CM | POA: Diagnosis not present

## 2022-09-15 DIAGNOSIS — I2510 Atherosclerotic heart disease of native coronary artery without angina pectoris: Secondary | ICD-10-CM | POA: Diagnosis not present

## 2022-09-15 DIAGNOSIS — L02419 Cutaneous abscess of limb, unspecified: Secondary | ICD-10-CM

## 2022-09-15 DIAGNOSIS — I5032 Chronic diastolic (congestive) heart failure: Secondary | ICD-10-CM | POA: Insufficient documentation

## 2022-09-15 DIAGNOSIS — S8012XA Contusion of left lower leg, initial encounter: Secondary | ICD-10-CM | POA: Diagnosis not present

## 2022-09-15 DIAGNOSIS — Z79899 Other long term (current) drug therapy: Secondary | ICD-10-CM | POA: Diagnosis not present

## 2022-09-15 DIAGNOSIS — R001 Bradycardia, unspecified: Secondary | ICD-10-CM | POA: Insufficient documentation

## 2022-09-15 DIAGNOSIS — L0291 Cutaneous abscess, unspecified: Secondary | ICD-10-CM | POA: Diagnosis present

## 2022-09-15 DIAGNOSIS — R2242 Localized swelling, mass and lump, left lower limb: Secondary | ICD-10-CM | POA: Diagnosis present

## 2022-09-15 DIAGNOSIS — I110 Hypertensive heart disease with heart failure: Secondary | ICD-10-CM | POA: Insufficient documentation

## 2022-09-15 DIAGNOSIS — M7989 Other specified soft tissue disorders: Secondary | ICD-10-CM | POA: Diagnosis not present

## 2022-09-15 DIAGNOSIS — Z86718 Personal history of other venous thrombosis and embolism: Secondary | ICD-10-CM | POA: Diagnosis not present

## 2022-09-15 DIAGNOSIS — R609 Edema, unspecified: Secondary | ICD-10-CM

## 2022-09-15 DIAGNOSIS — Z7982 Long term (current) use of aspirin: Secondary | ICD-10-CM | POA: Insufficient documentation

## 2022-09-15 DIAGNOSIS — L03116 Cellulitis of left lower limb: Secondary | ICD-10-CM | POA: Diagnosis not present

## 2022-09-15 DIAGNOSIS — Z8673 Personal history of transient ischemic attack (TIA), and cerebral infarction without residual deficits: Secondary | ICD-10-CM | POA: Diagnosis not present

## 2022-09-15 DIAGNOSIS — L02415 Cutaneous abscess of right lower limb: Secondary | ICD-10-CM | POA: Diagnosis not present

## 2022-09-15 DIAGNOSIS — Z91040 Latex allergy status: Secondary | ICD-10-CM | POA: Diagnosis not present

## 2022-09-15 DIAGNOSIS — L02416 Cutaneous abscess of left lower limb: Secondary | ICD-10-CM | POA: Insufficient documentation

## 2022-09-15 DIAGNOSIS — X58XXXA Exposure to other specified factors, initial encounter: Secondary | ICD-10-CM | POA: Insufficient documentation

## 2022-09-15 DIAGNOSIS — I10 Essential (primary) hypertension: Secondary | ICD-10-CM | POA: Diagnosis not present

## 2022-09-15 DIAGNOSIS — Z7902 Long term (current) use of antithrombotics/antiplatelets: Secondary | ICD-10-CM | POA: Diagnosis not present

## 2022-09-15 LAB — CBC WITH DIFFERENTIAL/PLATELET
Abs Immature Granulocytes: 0.01 10*3/uL (ref 0.00–0.07)
Basophils Absolute: 0 10*3/uL (ref 0.0–0.1)
Basophils Relative: 1 %
Eosinophils Absolute: 0.1 10*3/uL (ref 0.0–0.5)
Eosinophils Relative: 1 %
HCT: 39.1 % (ref 36.0–46.0)
Hemoglobin: 12.6 g/dL (ref 12.0–15.0)
Immature Granulocytes: 0 %
Lymphocytes Relative: 28 %
Lymphs Abs: 1.3 10*3/uL (ref 0.7–4.0)
MCH: 28.7 pg (ref 26.0–34.0)
MCHC: 32.2 g/dL (ref 30.0–36.0)
MCV: 89.1 fL (ref 80.0–100.0)
Monocytes Absolute: 0.5 10*3/uL (ref 0.1–1.0)
Monocytes Relative: 10 %
Neutro Abs: 2.8 10*3/uL (ref 1.7–7.7)
Neutrophils Relative %: 60 %
Platelets: 200 10*3/uL (ref 150–400)
RBC: 4.39 MIL/uL (ref 3.87–5.11)
RDW: 12.6 % (ref 11.5–15.5)
WBC: 4.6 10*3/uL (ref 4.0–10.5)
nRBC: 0 % (ref 0.0–0.2)

## 2022-09-15 LAB — BASIC METABOLIC PANEL
Anion gap: 12 (ref 5–15)
BUN: 18 mg/dL (ref 8–23)
CO2: 27 mmol/L (ref 22–32)
Calcium: 9.7 mg/dL (ref 8.9–10.3)
Chloride: 97 mmol/L — ABNORMAL LOW (ref 98–111)
Creatinine, Ser: 1.08 mg/dL — ABNORMAL HIGH (ref 0.44–1.00)
GFR, Estimated: 52 mL/min — ABNORMAL LOW (ref >60)
Glucose, Bld: 109 mg/dL — ABNORMAL HIGH (ref 70–99)
Potassium: 3.8 mmol/L (ref 3.5–5.1)
Sodium: 136 mmol/L (ref 135–145)

## 2022-09-15 MED ORDER — SODIUM CHLORIDE 0.9 % IV SOLN
100.0000 mg | Freq: Once | INTRAVENOUS | Status: AC
  Administered 2022-09-15: 100 mg via INTRAVENOUS
  Filled 2022-09-15: qty 100

## 2022-09-15 MED ORDER — OXYCODONE HCL 5 MG PO TABS
5.0000 mg | ORAL_TABLET | Freq: Four times a day (QID) | ORAL | Status: DC | PRN
  Administered 2022-09-16: 5 mg via ORAL
  Filled 2022-09-15: qty 1

## 2022-09-15 MED ORDER — ATORVASTATIN CALCIUM 10 MG PO TABS
20.0000 mg | ORAL_TABLET | Freq: Every evening | ORAL | Status: DC
  Administered 2022-09-16: 20 mg via ORAL
  Filled 2022-09-15: qty 2

## 2022-09-15 MED ORDER — VANCOMYCIN HCL 1500 MG/300ML IV SOLN
1500.0000 mg | Freq: Once | INTRAVENOUS | Status: AC
  Administered 2022-09-16: 1500 mg via INTRAVENOUS
  Filled 2022-09-15: qty 300

## 2022-09-15 MED ORDER — LACTATED RINGERS IV SOLN: INTRAVENOUS | Status: AC

## 2022-09-15 MED ORDER — SENNOSIDES-DOCUSATE SODIUM 8.6-50 MG PO TABS
1.0000 | ORAL_TABLET | Freq: Every day | ORAL | Status: DC
  Administered 2022-09-15 – 2022-09-16 (×2): 1 via ORAL
  Filled 2022-09-15 (×2): qty 1

## 2022-09-15 MED ORDER — ENOXAPARIN SODIUM 40 MG/0.4ML IJ SOSY
40.0000 mg | PREFILLED_SYRINGE | INTRAMUSCULAR | Status: DC
  Administered 2022-09-15 – 2022-09-16 (×2): 40 mg via SUBCUTANEOUS
  Filled 2022-09-15 (×2): qty 0.4

## 2022-09-15 MED ORDER — POLYETHYLENE GLYCOL 3350 17 G PO PACK: 17.0000 g | PACK | Freq: Every day | ORAL | Status: DC | PRN

## 2022-09-15 MED ORDER — VANCOMYCIN HCL IN DEXTROSE 1-5 GM/200ML-% IV SOLN
1000.0000 mg | INTRAVENOUS | Status: DC
  Administered 2022-09-16: 1000 mg via INTRAVENOUS
  Filled 2022-09-15: qty 200

## 2022-09-15 MED ORDER — SODIUM CHLORIDE 0.9 % IV SOLN
2.0000 g | Freq: Every day | INTRAVENOUS | Status: DC
  Administered 2022-09-16 – 2022-09-17 (×2): 2 g via INTRAVENOUS
  Filled 2022-09-15 (×2): qty 20

## 2022-09-15 MED ORDER — LAMOTRIGINE 100 MG PO TABS
100.0000 mg | ORAL_TABLET | Freq: Two times a day (BID) | ORAL | Status: DC
  Administered 2022-09-15 – 2022-09-17 (×4): 100 mg via ORAL
  Filled 2022-09-15 (×2): qty 1
  Filled 2022-09-15 (×2): qty 4

## 2022-09-15 MED ORDER — VITAMIN D 25 MCG (1000 UNIT) PO TABS
1000.0000 [IU] | ORAL_TABLET | Freq: Every day | ORAL | Status: DC
  Administered 2022-09-16 – 2022-09-17 (×2): 1000 [IU] via ORAL
  Filled 2022-09-15 (×2): qty 1

## 2022-09-15 NOTE — ED Provider Triage Note (Signed)
Emergency Medicine Provider Triage Evaluation Note  [name] [name] , a 82 y.o. female  was evaluated in triage.  Pt complains of left calf pain and swelling for a week. Remote hx DVT. On Plavix but no aspirin or anticoagulants. No chest pain, SOB, lightheadedness, dizziness, syncope, nausea, vomiting, diarrhea, fever, chills, wounds, or fall/injury. Sent from UC for DVT eval.   Review of Systems  Positive: See HPI Negative: See HPI  Physical Exam  BP (!) 157/65   Pulse (!) 56   Temp 97.9 F (36.6 C) (Oral)   Resp 16   Ht 5\' 9"  (1.753 m)   Wt 78 kg   SpO2 100%   BMI 25.40 kg/m  Gen:   Awake, no distress   Resp:  Normal effort  MSK:   L calf tenderness, LLE 1+ pitting edema, no open wounds, 2+ DP and PT pulse Other:  Neurological baseline per daughter  Medical Decision Making  Medically screening exam initiated at 3:54 PM.  Appropriate orders placed.  Nerissa Constantin Bodiford was informed that the remainder of the evaluation will be completed by another provider, this initial triage assessment does not replace that evaluation, and the importance of remaining in the ED until their evaluation is complete.     Suzzette Righter, PA-C 09/15/22 1556

## 2022-09-15 NOTE — H&P (Addendum)
History and Physical  [name] [name] GYJ:[id number] DOB: [birth year] DOA: 09/15/2022  Referring physician: Dr. Langston Masker, [place]. PCP: Donald Prose, MD  Outpatient Specialists: Neurology, cardiology. Patient coming from: Home.  Chief Complaint: Left calf swelling, redness, and pressure.  HPI: [name] [name] is a 82 y.o. female with medical history significant for seizure disorder, history of stroke, hyperlipidemia, hypertension, coronary artery disease, who presented to [facility] ED from home due to left lower extremity, calf, edema, erythema and tenderness.  Denies any trauma.  No reported subjective fevers or chills.  In the ED, venous Doppler ultrasound was negative for DVT however showed large well encapsulated cystic structure identified in the proximal calf measuring 2.46 cm x 4.09 cm with concern for an abscess.  EDP discussed the case with interventional radiology.  Plan for aspiration tomorrow.  Recommended observation admission for IV antibiotics overnight.  The patient was seen and examined in the ED.  Her daughter was present at the time of examination.  The patient had no new complaints.  CODE STATUS is full code.  Daughter requests fall precautions due to high fall risk.  ED Course: Tmax 98.4.  BP 162/68, pulse 58, respiratory 16, O2 saturation 100% on room air.  Lab studies remarkable for creatinine 1.08 from baseline of 0.7 in 2020.  Current GFR 52.  CBC essentially unremarkable.  Review of Systems: Review of systems as noted in the HPI. All other systems reviewed and are negative.   Past Medical History:  Diagnosis Date   CAD (coronary artery disease) 1997    with CABG   Cervical spine fracture [facility])    Not requiring surgery   Chest pain    Constipation    Foot drop, left    Gait disturbance    quad cane   Headache(784.0) 03/14/2013   Heart attack ([place])    Hemiparesis and alteration of sensations as late effects of stroke ([place]) 03/14/2013   left   Hypercholesterolemia     Hypertension    Osteoporosis    Palpitations    Periorbital hematoma of left eye    Peripheral vascular disease (HCC)    Left superficial femoral artery   Peroneal DVT (deep venous thrombosis) (HCC)    Post-menopausal    PVD (peripheral vascular disease) ([place])    Seizures ([place])    Stroke ([place])    Stroke ([place]) 2010   Urinary incontinence    Past Surgical History:  Procedure Laterality Date   CATARACT EXTRACTION     CORONARY ARTERY BYPASS GRAFT     x3    Social History:  reports that she has never smoked. She has never used smokeless tobacco. She reports that she does not drink alcohol and does not use drugs.   Allergies  Allergen Reactions   Latex Rash   Penicillins Rash    Has patient had a PCN reaction causing immediate rash, facial/tongue/throat swelling, SOB or lightheadedness with hypotension: No Has patient had a PCN reaction causing severe rash involving mucus membranes or skin necrosis: No Has patient had a PCN reaction that required hospitalization No Has patient had a PCN reaction occurring within the last 10 years: No If all of the above answers are "NO", then may proceed with Cephalosporin use.   Tape Rash    Family History  Problem Relation Age of Onset   Heart attack Mother    Heart attack Father    Heart attack Sister    Heart attack Brother       Prior  to Admission medications   Medication Sig Start Date End Date Taking? Authorizing Provider  acetaminophen (TYLENOL) 500 MG tablet Take 1-2 tablets (500-1,000 mg total) by mouth every 8 (eight) hours as needed for mild pain. 12/18/15   Mast, Man X, NP  aspirin EC 81 MG tablet Take 1 tablet (81 mg total) by mouth daily. With Breakfast 10/08/18   Shon Hale, MD  atorvastatin (LIPITOR) 20 MG tablet Take 1 tablet (20 mg total) by mouth every evening. 10/08/18   Shon Hale, MD  cholecalciferol (VITAMIN D) 1000 units tablet Take 1 tablet (1,000 Units total) by mouth daily. 10/08/18   Shon Hale, MD   clopidogrel (PLAVIX) 75 MG tablet Take 1 tablet (75 mg total) by mouth daily. 10/08/18   Shon Hale, MD  lamoTRIgine (LAMICTAL) 100 MG tablet TAKE 1 TABLET BY MOUTH TWICE DAILY 04/21/22   Butch Penny, NP  lisinopril (PRINIVIL,ZESTRIL) 20 MG tablet Take 1 tablet (20 mg total) by mouth at bedtime. 10/08/18   Shon Hale, MD  metoprolol succinate (TOPROL-XL) 50 MG 24 hr tablet Take 1 tablet (50 mg total) by mouth daily. Take with or immediately following a meal. Patient taking differently: Take 100 mg by mouth daily. Take with or immediately following a meal. 10/08/18   Emokpae, Courage, MD  nitroGLYCERIN (NITROSTAT) 0.4 MG SL tablet DISSOLVE 1 TABLET UNDER THE TONGUE EVERY 5 MINUTES AS NEEDED FOR CHEST PAIN OR SHORTNESS OF BREATH Patient taking differently: Place 0.4 mg under the tongue every 5 (five) minutes as needed for chest pain.  12/18/15   Mast, Man X, NP  polyethylene glycol (MIRALAX / GLYCOLAX) packet Take 17 g by mouth daily as needed for mild constipation.     [provider]  polyethylene glycol (MIRALAX / GLYCOLAX) packet Take 17 g by mouth daily. 10/08/18   Shon Hale, MD  senna-docusate (SENOKOT-S) 8.6-50 MG tablet Take 2 tablets by mouth at bedtime. 10/08/18   Shon Hale, MD    Physical Exam: BP (!) 162/68   Pulse (!) 58   Temp 98.4 F (36.9 C) (Oral)   Resp 16   Ht 5\' 9"  (1.753 m)   Wt 78 kg   SpO2 100%   BMI 25.40 kg/m   General: 82 y.o. year-old female well developed well nourished in no acute distress.  Alert and interactive. Cardiovascular: Regular rate and rhythm with no rubs or gallops.  No thyromegaly or JVD noted.  2+ pitting edema in left lower extremity edema. Respiratory: Clear to auscultation with no wheezes or rales. Good inspiratory effort. Abdomen: Soft nontender nondistended with normal bowel sounds x4 quadrants. Muskuloskeletal: No cyanosis, clubbing or edema noted bilaterally Neuro: CN II-XII intact, strength, sensation,  reflexes Skin: No ulcerative lesions noted or rashes Psychiatry: Judgement and insight appear normal. Mood is appropriate for condition and setting          Labs on Admission:  Basic Metabolic Panel: Recent Labs  Lab 09/15/22 1600  NA 136  K 3.8  CL 97*  CO2 27  GLUCOSE 109*  BUN 18  CREATININE 1.08*  CALCIUM 9.7   Liver Function Tests: No results for input(s): "AST", "ALT", "ALKPHOS", "BILITOT", "PROT", "ALBUMIN" in the last 168 hours. No results for input(s): "LIPASE", "AMYLASE" in the last 168 hours. No results for input(s): "AMMONIA" in the last 168 hours. CBC: Recent Labs  Lab 09/15/22 1600  WBC 4.6  NEUTROABS 2.8  HGB 12.6  HCT 39.1  MCV 89.1  PLT 200   Cardiac Enzymes: No  results for input(s): "CKTOTAL", "CKMB", "CKMBINDEX", "TROPONINI" in the last 168 hours.  BNP (last 3 results) No results for input(s): "BNP" in the last 8760 hours.  ProBNP (last 3 results) No results for input(s): "PROBNP" in the last 8760 hours.  CBG: No results for input(s): "GLUCAP" in the last 168 hours.  Radiological Exams on Admission: VAS [place] LOWER EXTREMITY VENOUS (DVT) (ONLY [facility] & [facility])  Result Date: 09/15/2022  Lower Venous DVT Study Patient Name:  TORRA PALA Verne  Date of Exam:   09/15/2022 Medical Rec #: [id number]     Accession #:    [id number] Date of Birth: 1941/01/09     Patient Gender: F Patient Age:   82 years Exam Location:  [facility] Procedure:      VAS [place] LOWER EXTREMITY VENOUS (DVT) Referring Phys: [facility] [place] --------------------------------------------------------------------------------  Indications: Swelling, and Edema.  Comparison Study: no prior Performing Technologist: [place] Ponder RVS  Examination Guidelines: A complete evaluation includes B-mode imaging, spectral Doppler, color Doppler, and power Doppler as needed of all accessible portions of each vessel. Bilateral testing is considered an integral part of a complete examination. Limited examinations for  reoccurring indications may be performed as noted. The reflux portion of the exam is performed with the patient in reverse Trendelenburg.  +-----+---------------+---------+-----------+----------+--------------+ RIGHTCompressibilityPhasicitySpontaneityPropertiesThrombus Aging +-----+---------------+---------+-----------+----------+--------------+ CFV  Full           Yes      Yes                                 +-----+---------------+---------+-----------+----------+--------------+   +---------+---------------+---------+-----------+----------+---------------+ LEFT     CompressibilityPhasicitySpontaneityPropertiesThrombus Aging  +---------+---------------+---------+-----------+----------+---------------+ CFV      Full           Yes      Yes                                  +---------+---------------+---------+-----------+----------+---------------+ SFJ      Full                                                         +---------+---------------+---------+-----------+----------+---------------+ FV Prox  Full                                                         +---------+---------------+---------+-----------+----------+---------------+ FV Mid   Full                                                         +---------+---------------+---------+-----------+----------+---------------+ FV DistalFull                                                         +---------+---------------+---------+-----------+----------+---------------+ PFV  Full                                                         +---------+---------------+---------+-----------+----------+---------------+ POP      Full           Yes      Yes                                  +---------+---------------+---------+-----------+----------+---------------+ PTV      Full                                                          +---------+---------------+---------+-----------+----------+---------------+ PERO                                                  patent by color +---------+---------------+---------+-----------+----------+---------------+     Summary: RIGHT: - No evidence of common femoral vein obstruction.  LEFT: - There is no evidence of deep vein thrombosis in the lower extremity.  - No cystic structure found in the popliteal fossa. - Large well-encapsulated cystic structure identified in the proximal calf measuring 2.46cm x 4.09cm.  *See table(s) above for measurements and observations.    Preliminary     EKG: I independently viewed the EKG done and my findings are as followed: None available at the time of this visit.  Assessment/Plan Present on Admission:  Abscess  Principal Problem:   Abscess  Left calf abscess and cellulitis, POA Obtain MRSA screening test Received 1 dose of IV doxycycline 100 mg in the ED Plan for IR drainage on 09/16/2022 IR consulted, Dr. Tawny Asal. Follow body fluid culture and peripheral blood cultures x 2 IV vancomycin and IV Rocephin. Gentle IV fluid hydration while on IV vancomycin, LR 50 cc/h x 1 day. Pain control and bowel regimen  Hypertension, BP is not at goal, elevated Home lisinopril held due to AKI. Home metoprolol held due to bradycardia.  Bradycardia Obtain 2D echo Hold off home beta-blocker Monitor on telemetry  Seizure disorder Resume home AED Seizure precautions  History of CVA on DAPT Home aspirin and Plavix held due to planned I&D of Abscess on 09/16/2022 by IR. Resume home Lipitor.  Chronic diastolic CHF Euvolemic on exam Last 2D echo done on 10/07/2018 revealed LVEF 60 to 65% Start strict I's and O's and daily weight  Hyperlipidemia Resume home Lipitor  Physical debility Ambulatory dysfunction PT OT assessment Fall precautions      DVT prophylaxis: Subcu Lovenox daily  Code Status: Full code estimated patient was self with  her daughter present at bedside.  Family Communication: Updated the patient's daughter at bedside.  Disposition Plan: Admitted to telemetry surgical unit  Consults called: IR, Dr. Rollene Rotunda, consulted by EDP.  Admission status: Observation status   Status is: Observation    Kayleen Memos MD [facility] Pager [phone]  If 7PM-7AM, please contact night-coverage [email or web address] Password [facility]  09/15/2022, 9:11 PM

## 2022-09-15 NOTE — Progress Notes (Signed)
Pharmacy Antibiotic Note  [name] [name] is a 82 y.o. female admitted on 09/15/2022 with L calf cellulitis.  Pharmacy has been consulted for Vancomycin dosing.  Plan: Vancomycin 1500 mg IV now then 1000 mg IV Q 24 hrs. Goal AUC 400-550. Expected AUC: 433 SCr used: 1.08 Will f/u renal function, micro data, and pt's clinical condition Vanc levels prn   Height: 5\' 9"  (175.3 cm) Weight: 78 kg (172 lb) IBW/kg (Calculated) : 66.2  Temp (24hrs), Avg:98.2 F (36.8 C), Min:97.9 F (36.6 C), Max:98.4 F (36.9 C)  Recent Labs  Lab 09/15/22 1600  WBC 4.6  CREATININE 1.08*    Estimated Creatinine Clearance: 44.1 mL/min (A) (by C-G formula based on SCr of 1.08 mg/dL (H)).    Allergies  Allergen Reactions   Latex Rash   Penicillins Rash    Has patient had a PCN reaction causing immediate rash, facial/tongue/throat swelling, SOB or lightheadedness with hypotension: No Has patient had a PCN reaction causing severe rash involving mucus membranes or skin necrosis: No Has patient had a PCN reaction that required hospitalization No Has patient had a PCN reaction occurring within the last 10 years: No If all of the above answers are "NO", then may proceed with Cephalosporin use.   Tape Rash    Antimicrobials this admission: 1/16 Vanc >>   Microbiology results: Pending  Thank you for allowing pharmacy to be a part of this patient's care.  Sherlon Handing, PharmD, BCPS Please see amion for complete clinical pharmacist phone list 09/15/2022 9:13 PM

## 2022-09-15 NOTE — Progress Notes (Signed)
Lower extremity venous duplex has been completed.   Preliminary results in CV Proc.   [name] [name] 09/15/2022 5:11 PM

## 2022-09-15 NOTE — ED Triage Notes (Signed)
Pt came in via POV from UC d/t c/o area on her Lt leg on her calf an area that feels hard/firm. Denies pain, does have HX of blood clot.

## 2022-09-15 NOTE — ED Provider Notes (Signed)
[facility] EMERGENCY DEPARTMENT Provider Note   CSN: [id number] Arrival date & time: 09/15/22  1446     History  Chief Complaint  Patient presents with   Swollen Lt leg    [name] [name] is a 82 y.o. female here with left leg warmth, redness and swelling x 1 week.  No trauma or falls.  Went to UC and referred to ED for ultrasound.  HPI     Home Medications Prior to Admission medications   Medication Sig Start Date End Date Taking? Authorizing Provider  acetaminophen (TYLENOL) 500 MG tablet Take 1-2 tablets (500-1,000 mg total) by mouth every 8 (eight) hours as needed for mild pain. 12/18/15   Mast, Man X, NP  aspirin EC 81 MG tablet Take 1 tablet (81 mg total) by mouth daily. With Breakfast 10/08/18   Shon Hale, MD  atorvastatin (LIPITOR) 20 MG tablet Take 1 tablet (20 mg total) by mouth every evening. 10/08/18   Shon Hale, MD  cholecalciferol (VITAMIN D) 1000 units tablet Take 1 tablet (1,000 Units total) by mouth daily. 10/08/18   Shon Hale, MD  clopidogrel (PLAVIX) 75 MG tablet Take 1 tablet (75 mg total) by mouth daily. 10/08/18   Shon Hale, MD  lamoTRIgine (LAMICTAL) 100 MG tablet TAKE 1 TABLET BY MOUTH TWICE DAILY 04/21/22   Butch Penny, NP  lisinopril (PRINIVIL,ZESTRIL) 20 MG tablet Take 1 tablet (20 mg total) by mouth at bedtime. 10/08/18   Shon Hale, MD  metoprolol succinate (TOPROL-XL) 50 MG 24 hr tablet Take 1 tablet (50 mg total) by mouth daily. Take with or immediately following a meal. Patient taking differently: Take 100 mg by mouth daily. Take with or immediately following a meal. 10/08/18   Emokpae, Courage, MD  nitroGLYCERIN (NITROSTAT) 0.4 MG SL tablet DISSOLVE 1 TABLET UNDER THE TONGUE EVERY 5 MINUTES AS NEEDED FOR CHEST PAIN OR SHORTNESS OF BREATH Patient taking differently: Place 0.4 mg under the tongue every 5 (five) minutes as needed for chest pain.  12/18/15   Mast, Man X, NP  polyethylene glycol (MIRALAX / GLYCOLAX)  packet Take 17 g by mouth daily as needed for mild constipation.     [provider]  polyethylene glycol (MIRALAX / GLYCOLAX) packet Take 17 g by mouth daily. 10/08/18   Shon Hale, MD  senna-docusate (SENOKOT-S) 8.6-50 MG tablet Take 2 tablets by mouth at bedtime. 10/08/18   Shon Hale, MD      Allergies    Latex, Penicillins, and Tape    Review of Systems   Review of Systems  Physical Exam Updated Vital Signs BP (!) 162/68   Pulse (!) 58   Temp 98.4 F (36.9 C) (Oral)   Resp 16   Ht 5\' 9"  (1.753 m)   Wt 78 kg   SpO2 100%   BMI 25.40 kg/m  Physical Exam Constitutional:      General: She is not in acute distress. HENT:     Head: Normocephalic and atraumatic.  Eyes:     Conjunctiva/sclera: Conjunctivae normal.     Pupils: Pupils are equal, round, and reactive to light.  Cardiovascular:     Rate and Rhythm: Normal rate and regular rhythm.  Pulmonary:     Effort: Pulmonary effort is normal. No respiratory distress.  Abdominal:     General: There is no distension.     Tenderness: There is no abdominal tenderness.  Musculoskeletal:     Comments: Left lower extremity warm, erythematous, mildly swollen  Skin:  General: Skin is warm and dry.  Neurological:     General: No focal deficit present.     Mental Status: She is alert. Mental status is at baseline.  Psychiatric:        Mood and Affect: Mood normal.        Behavior: Behavior normal.     ED Results / Procedures / Treatments   Labs (all labs ordered are listed, but only abnormal results are displayed) Labs Reviewed  BASIC METABOLIC PANEL - Abnormal; Notable for the following components:      Result Value   Chloride 97 (*)    Glucose, Bld 109 (*)    Creatinine, Ser 1.08 (*)    GFR, Estimated 52 (*)    All other components within normal limits  AEROBIC/ANAEROBIC CULTURE W GRAM STAIN (SURGICAL/DEEP WOUND)  MRSA NEXT GEN BY PCR, NASAL  CULTURE, BLOOD (ROUTINE X 2)  CULTURE, BLOOD (ROUTINE  X 2)  CBC WITH DIFFERENTIAL/PLATELET  CBC  BASIC METABOLIC PANEL  MAGNESIUM  PHOSPHORUS    EKG None  Radiology VAS [place] LOWER EXTREMITY VENOUS (DVT) (ONLY [facility] & [facility])  Result Date: 09/15/2022  Lower Venous DVT Study Patient Name:  [name] [name]  Date of Exam:   09/15/2022 Medical Rec #: [id number]     Accession #:    [id number] Date of Birth: Nov 20, 1940     Patient Gender: F Patient Age:   54 years Exam Location:  [facility] Procedure:      VAS [place] LOWER EXTREMITY VENOUS (DVT) Referring Phys: [facility] --------------------------------------------------------------------------------  Indications: Swelling, and Edema.  Comparison Study: no prior Performing Technologist: Archie Patten RVS  Examination Guidelines: A complete evaluation includes B-mode imaging, spectral Doppler, color Doppler, and power Doppler as needed of all accessible portions of each vessel. Bilateral testing is considered an integral part of a complete examination. Limited examinations for reoccurring indications may be performed as noted. The reflux portion of the exam is performed with the patient in reverse Trendelenburg.  +-----+---------------+---------+-----------+----------+--------------+ RIGHTCompressibilityPhasicitySpontaneityPropertiesThrombus Aging +-----+---------------+---------+-----------+----------+--------------+ CFV  Full           Yes      Yes                                 +-----+---------------+---------+-----------+----------+--------------+   +---------+---------------+---------+-----------+----------+---------------+ LEFT     CompressibilityPhasicitySpontaneityPropertiesThrombus Aging  +---------+---------------+---------+-----------+----------+---------------+ CFV      Full           Yes      Yes                                  +---------+---------------+---------+-----------+----------+---------------+ SFJ      Full                                                          +---------+---------------+---------+-----------+----------+---------------+ FV Prox  Full                                                         +---------+---------------+---------+-----------+----------+---------------+ FV Mid  Full                                                         +---------+---------------+---------+-----------+----------+---------------+ FV DistalFull                                                         +---------+---------------+---------+-----------+----------+---------------+ PFV      Full                                                         +---------+---------------+---------+-----------+----------+---------------+ POP      Full           Yes      Yes                                  +---------+---------------+---------+-----------+----------+---------------+ PTV      Full                                                         +---------+---------------+---------+-----------+----------+---------------+ PERO                                                  patent by color +---------+---------------+---------+-----------+----------+---------------+     Summary: RIGHT: - No evidence of common femoral vein obstruction.  LEFT: - There is no evidence of deep vein thrombosis in the lower extremity.  - No cystic structure found in the popliteal fossa. - Large well-encapsulated cystic structure identified in the proximal calf measuring 2.46cm x 4.09cm.  *See table(s) above for measurements and observations.    Preliminary     Procedures Procedures    Medications Ordered in ED Medications  doxycycline (VIBRAMYCIN) 100 mg in sodium chloride 0.9 % 250 mL IVPB (100 mg Intravenous New Bag/Given 09/15/22 2203)  enoxaparin (LOVENOX) injection 40 mg (40 mg Subcutaneous Given 09/15/22 2209)  atorvastatin (LIPITOR) tablet 20 mg (has no administration in time range)  cholecalciferol (VITAMIN D3) 25 MCG (1000 UNIT) tablet 1,000 Units  (has no administration in time range)  lamoTRIgine (LAMICTAL) tablet 100 mg (100 mg Oral Given 09/15/22 2204)  cefTRIAXone (ROCEPHIN) 2 g in sodium chloride 0.9 % 100 mL IVPB (has no administration in time range)  oxyCODONE (Oxy IR/ROXICODONE) immediate release tablet 5 mg (has no administration in time range)  senna-docusate (Senokot-S) tablet 1 tablet (1 tablet Oral Given 09/15/22 2204)  polyethylene glycol (MIRALAX / GLYCOLAX) packet 17 g (has no administration in time range)  lactated ringers infusion (has no administration in time range)  vancomycin (VANCOREADY) IVPB 1500 mg/300 mL (has no administration in time range)  vancomycin (VANCOCIN) IVPB 1000 mg/200  mL premix (has no administration in time range)    ED Course/ Medical Decision Making/ A&P Clinical Course as of 09/15/22 2305  Tue Sep 15, 2022  1913 I spoke to Dr Serafina Royals from IR who states they can aspirate abscess/cyst tomorrow morning.  Pt to be admitted to hospital overnight on antibiotics. [MT]    Clinical Course User Index [MT] Kypton Eltringham, Carola Rhine, MD                             Medical Decision Making Risk Decision regarding hospitalization.   Left leg swelling and redness differential includes cellulitis versus abscess versus venous stasis versus DVT versus other \ Vascular ultrasound was obtained personally viewed interpreted, concerning for thick cystic structure in the calf compartment, per my interpretation is to be consistent with either abscess or cyst.  However given her clinical exam with warmth and erythema of the lower extremity MO suspect this is infectious in nature.  She is afebrile, has no leukocytosis, and does not show signs of sepsis.  I will start on antibiotics, doxycycline would be a reasonable option.  I did speak to interventional radiology by phone, see ED course.  They report they would be able to perform an aspiration tomorrow, which I think would be the least invasive approach to this leg wound.   In the meantime the patient will be admitted on antibiotics for observation overnight.  The patient and her daughter are in agreement with this plan.        Final Clinical Impression(s) / ED Diagnoses Final diagnoses:  Cellulitis of left lower extremity  Abscess of calf    Rx / DC Orders ED Discharge Orders     None         Wyvonnia Dusky, MD 09/15/22 2305

## 2022-09-16 ENCOUNTER — Observation Stay (HOSPITAL_COMMUNITY): Payer: Medicare PPO

## 2022-09-16 ENCOUNTER — Other Ambulatory Visit: Payer: Self-pay

## 2022-09-16 DIAGNOSIS — L0291 Cutaneous abscess, unspecified: Secondary | ICD-10-CM | POA: Diagnosis not present

## 2022-09-16 DIAGNOSIS — L02416 Cutaneous abscess of left lower limb: Secondary | ICD-10-CM | POA: Diagnosis not present

## 2022-09-16 HISTORY — PX: IR US GUIDE BX ASP/DRAIN: IMG2392

## 2022-09-16 LAB — BASIC METABOLIC PANEL
Anion gap: 10 (ref 5–15)
BUN: 14 mg/dL (ref 8–23)
CO2: 24 mmol/L (ref 22–32)
Calcium: 8.9 mg/dL (ref 8.9–10.3)
Chloride: 102 mmol/L (ref 98–111)
Creatinine, Ser: 0.96 mg/dL (ref 0.44–1.00)
GFR, Estimated: >60 mL/min (ref >60)
Glucose, Bld: 94 mg/dL (ref 70–99)
Potassium: 3.7 mmol/L (ref 3.5–5.1)
Sodium: 136 mmol/L (ref 135–145)

## 2022-09-16 LAB — CBC
HCT: 35.4 % — ABNORMAL LOW (ref 36.0–46.0)
Hemoglobin: 11.1 g/dL — ABNORMAL LOW (ref 12.0–15.0)
MCH: 28.6 pg (ref 26.0–34.0)
MCHC: 31.4 g/dL (ref 30.0–36.0)
MCV: 91.2 fL (ref 80.0–100.0)
Platelets: 175 10*3/uL (ref 150–400)
RBC: 3.88 MIL/uL (ref 3.87–5.11)
RDW: 12.8 % (ref 11.5–15.5)
WBC: 4.9 10*3/uL (ref 4.0–10.5)
nRBC: 0 % (ref 0.0–0.2)

## 2022-09-16 LAB — MAGNESIUM: Magnesium: 1.9 mg/dL (ref 1.7–2.4)

## 2022-09-16 LAB — MRSA NEXT GEN BY PCR, NASAL: MRSA by PCR Next Gen: NOT DETECTED

## 2022-09-16 LAB — PHOSPHORUS: Phosphorus: 2.8 mg/dL (ref 2.5–4.6)

## 2022-09-16 MED ORDER — ONDANSETRON HCL 4 MG/2ML IJ SOLN
4.0000 mg | Freq: Four times a day (QID) | INTRAMUSCULAR | Status: DC | PRN
  Administered 2022-09-16 (×2): 4 mg via INTRAVENOUS
  Filled 2022-09-16 (×2): qty 2

## 2022-09-16 MED ORDER — LIDOCAINE HCL 1 % IJ SOLN
INTRAMUSCULAR | Status: AC
  Administered 2022-09-16: 8 mL
  Filled 2022-09-16: qty 20

## 2022-09-16 MED ORDER — ACETAMINOPHEN 325 MG PO TABS
650.0000 mg | ORAL_TABLET | Freq: Four times a day (QID) | ORAL | Status: DC | PRN
  Administered 2022-09-16: 650 mg via ORAL
  Filled 2022-09-16: qty 2

## 2022-09-16 MED ORDER — LISINOPRIL 10 MG PO TABS
10.0000 mg | ORAL_TABLET | Freq: Every day | ORAL | Status: DC
  Administered 2022-09-16 – 2022-09-17 (×2): 10 mg via ORAL
  Filled 2022-09-16 (×2): qty 1

## 2022-09-16 NOTE — Progress Notes (Signed)
PT Cancellation Note  Patient Details Name: [name] [name] MRN: [id number] DOB: 17-Jul-1941   Cancelled Treatment:    Reason Eval/Treat Not Completed: Medical issues which prohibited therapy. PT holding until aspiration of L calf is performed.   Zenaida Niece 09/16/2022, 12:57 PM

## 2022-09-16 NOTE — Procedures (Signed)
Interventional Radiology Procedure Note  Procedure: Left lower extremity fluid collection aspiration  Findings: Please refer to procedural dictation for full description. Heterogenous left lower extremity medial fluid collection yielding approximately 3 mL dark sanguinous fluid.  Sample sent for culture.  Complications: None immediate  Estimated Blood Loss: < 5 mL  Recommendations: Follow up culture.  Fluid collection almost certainly represents hematoma.   Ruthann Cancer, MD

## 2022-09-16 NOTE — Progress Notes (Signed)
OT Cancellation Note  Patient Details Name: [name] [name] MRN: [id number] DOB: 02/28/1941   Cancelled Treatment:    Reason Eval/Treat Not Completed: Medical issues which prohibited therapy Holding OT eval until L calf aspiration is performed.  Layla Maw 09/16/2022, 1:20 PM

## 2022-09-16 NOTE — ED Notes (Signed)
ED TO INPATIENT HANDOFF REPORT  ED Nurse Name and Phone #: Brett Canales [phone]  S Name/Age/Gender [name] [name] Qadir 82 y.o. female Room/Bed: 043C/043C  Code Status   Code Status: Full Code  Home/SNF/Other Home Patient oriented to: self, place, time, and situation Is this baseline? Yes   Triage Complete: Triage complete  Chief Complaint Abscess [L02.91]  Triage Note Pt came in via POV from UC d/t c/o area on her Lt leg on her calf an area that feels hard/firm. Denies pain, does have HX of blood clot.    Allergies Allergies  Allergen Reactions   Latex Rash   Penicillins Rash    Has patient had a PCN reaction causing immediate rash, facial/tongue/throat swelling, SOB or lightheadedness with hypotension: No Has patient had a PCN reaction causing severe rash involving mucus membranes or skin necrosis: No Has patient had a PCN reaction that required hospitalization No Has patient had a PCN reaction occurring within the last 10 years: No If all of the above answers are "NO", then may proceed with Cephalosporin use.   Tape Rash    Level of Care/Admitting Diagnosis ED Disposition     ED Disposition  Admit   Condition  --   Comment  Hospital Area: [facility] [[id number]]  Level of Care: Telemetry Surgical [105]  May place patient in observation at [facility] or [facility] if equivalent level of care is available:: Yes  Covid Evaluation: Asymptomatic - no recent exposure (last 10 days) testing not required  Diagnosis: Abscess [259563]  Admitting Physician: Darlin Drop [[id number]]  Attending Physician: Darlin Drop [[id number]]          B Medical/Surgery History Past Medical History:  Diagnosis Date   CAD (coronary artery disease) 1997    with CABG   Cervical spine fracture (HCC)    Not requiring surgery   Chest pain    Constipation    Foot drop, left    Gait disturbance    quad cane   Headache(784.0) 03/14/2013   Heart attack (HCC)    Hemiparesis  and alteration of sensations as late effects of stroke (HCC) 03/14/2013   left   Hypercholesterolemia    Hypertension    Osteoporosis    Palpitations    Periorbital hematoma of left eye    Peripheral vascular disease (HCC)    Left superficial femoral artery   Peroneal DVT (deep venous thrombosis) (HCC)    Post-menopausal    PVD (peripheral vascular disease) (HCC)    Seizures (HCC)    Stroke (HCC)    Stroke (HCC) 2010   Urinary incontinence    Past Surgical History:  Procedure Laterality Date   CATARACT EXTRACTION     CORONARY ARTERY BYPASS GRAFT     x3     A IV Location/Drains/Wounds Patient Lines/Drains/Airways Status     Active Line/Drains/Airways     Name Placement date Placement time Site Days   Peripheral IV 09/15/22 20 G Right Antecubital 09/15/22  2007  Antecubital  1   External Urinary Catheter 09/16/22  0330  --  less than 1   Wound / Incision (Open or Dehisced) 12/01/15 Other (Comment) Face Left Left Facial abrasion below eye, dry and intact 12/01/15  1700  Face  2481            Intake/Output Last 24 hours  Intake/Output Summary (Last 24 hours) at 09/16/2022 1202 Last data filed at 09/16/2022 0255 Gross per 24 hour  Intake 637.22 ml  Output --  Net 637.22 ml    Labs/Imaging Results for orders placed or performed during the hospital encounter of 09/15/22 (from the past 48 hour(s))  Basic metabolic panel     Status: Abnormal   Collection Time: 09/15/22  4:00 PM  Result Value Ref Range   Sodium 136 135 - 145 mmol/L   Potassium 3.8 3.5 - 5.1 mmol/L   Chloride 97 (L) 98 - 111 mmol/L   CO2 27 22 - 32 mmol/L   Glucose, Bld 109 (H) 70 - 99 mg/dL    Comment: Glucose reference range applies only to samples taken after fasting for at least 8 hours.   BUN 18 8 - 23 mg/dL   Creatinine, Ser 3.81 (H) 0.44 - 1.00 mg/dL   Calcium 9.7 8.9 - 01.7 mg/dL   GFR, Estimated 52 (L) >60 mL/min    Comment: (NOTE) Calculated using the CKD-EPI Creatinine Equation (2021)     Anion gap 12 5 - 15    Comment: Performed at [facility], [address], [place], [place] [address]  CBC with Differential     Status: None   Collection Time: 09/15/22  4:00 PM  Result Value Ref Range   WBC 4.6 4.0 - 10.5 K/uL   RBC 4.39 3.87 - 5.11 MIL/uL   Hemoglobin 12.6 12.0 - 15.0 g/dL   HCT 85.2 77.8 - 24.2 %   MCV 89.1 80.0 - 100.0 fL   MCH 28.7 26.0 - 34.0 pg   MCHC 32.2 30.0 - 36.0 g/dL   RDW 35.3 61.4 - 43.1 %   Platelets 200 150 - 400 K/uL   nRBC 0.0 0.0 - 0.2 %   Neutrophils Relative % 60 %   Neutro Abs 2.8 1.7 - 7.7 K/uL   Lymphocytes Relative 28 %   Lymphs Abs 1.3 0.7 - 4.0 K/uL   Monocytes Relative 10 %   Monocytes Absolute 0.5 0.1 - 1.0 K/uL   Eosinophils Relative 1 %   Eosinophils Absolute 0.1 0.0 - 0.5 K/uL   Basophils Relative 1 %   Basophils Absolute 0.0 0.0 - 0.1 K/uL   Immature Granulocytes 0 %   Abs Immature Granulocytes 0.01 0.00 - 0.07 K/uL    Comment: Performed at [facility], [address] [place], [place] [address]  MRSA Next Gen by PCR, Nasal     Status: None   Collection Time: 09/15/22 11:46 PM   Specimen: Nasal Mucosa; Nasal Swab  Result Value Ref Range   MRSA by PCR Next Gen NOT DETECTED NOT DETECTED    Comment: (NOTE) The GeneXpert MRSA Assay (FDA approved for NASAL specimens only), is one component of a comprehensive MRSA colonization surveillance program. It is not intended to diagnose MRSA infection nor to guide or monitor treatment for MRSA infections. Test performance is not FDA approved in patients less than 26 years old. Performed at [facility], [address] [place], [place] [address]   CBC     Status: Abnormal   Collection Time: 09/16/22  4:25 AM  Result Value Ref Range   WBC 4.9 4.0 - 10.5 K/uL   RBC 3.88 3.87 - 5.11 MIL/uL   Hemoglobin 11.1 (L) 12.0 - 15.0 g/dL   HCT 50.9 (L) 32.6 - 71.2 %   MCV 91.2 80.0 - 100.0 fL   MCH 28.6 26.0 - 34.0 pg   MCHC 31.4 30.0 - 36.0 g/dL   RDW 45.8 09.9 - 83.3  %   Platelets  175 150 - 400 K/uL   nRBC 0.0 0.0 - 0.2 %    Comment: Performed at [facility], [place] [address], [place], [place] [address]  Basic metabolic panel     Status: None   Collection Time: 09/16/22  4:25 AM  Result Value Ref Range   Sodium 136 135 - 145 mmol/L   Potassium 3.7 3.5 - 5.1 mmol/L   Chloride 102 98 - 111 mmol/L   CO2 24 22 - 32 mmol/L   Glucose, Bld 94 70 - 99 mg/dL    Comment: Glucose reference range applies only to samples taken after fasting for at least 8 hours.   BUN 14 8 - 23 mg/dL   Creatinine, Ser 0.96 0.44 - 1.00 mg/dL   Calcium 8.9 8.9 - 10.3 mg/dL   GFR, Estimated >60 >60 mL/min    Comment: (NOTE) Calculated using the CKD-EPI Creatinine Equation (2021)    Anion gap 10 5 - 15    Comment: Performed at [facility] [address], [place], [place] [address]  Magnesium     Status: None   Collection Time: 09/16/22  4:25 AM  Result Value Ref Range   Magnesium 1.9 1.7 - 2.4 mg/dL    Comment: Performed at [facility] [address], [place], [place] [address]  Phosphorus     Status: None   Collection Time: 09/16/22  4:25 AM  Result Value Ref Range   Phosphorus 2.8 2.5 - 4.6 mg/dL    Comment: Performed at [facility] [address] [place], [place] [address]   VAS [place] LOWER EXTREMITY VENOUS (DVT) (ONLY [facility] & [facility])  Result Date: 09/16/2022  Lower Venous DVT Study Patient Name:  LIZZETE GOUGH Laroque  Date of Exam:   09/15/2022 Medical Rec #: [id number]     Accession #:    [id number] Date of Birth: 1941-06-25     Patient Gender: F Patient Age:   21 years Exam Location:  [facility] Procedure:      VAS [place] LOWER EXTREMITY VENOUS (DVT) Referring Phys: [facility] GOWENS --------------------------------------------------------------------------------  Indications: Swelling, and Edema.  Comparison Study: no prior Performing Technologist: Archie Patten RVS  Examination Guidelines: A complete evaluation includes B-mode imaging, spectral Doppler,  color Doppler, and power Doppler as needed of all accessible portions of each vessel. Bilateral testing is considered an integral part of a complete examination. Limited examinations for reoccurring indications may be performed as noted. The reflux portion of the exam is performed with the patient in reverse Trendelenburg.  +-----+---------------+---------+-----------+----------+--------------+ RIGHTCompressibilityPhasicitySpontaneityPropertiesThrombus Aging +-----+---------------+---------+-----------+----------+--------------+ CFV  Full           Yes      Yes                                 +-----+---------------+---------+-----------+----------+--------------+   +---------+---------------+---------+-----------+----------+---------------+ LEFT     CompressibilityPhasicitySpontaneityPropertiesThrombus Aging  +---------+---------------+---------+-----------+----------+---------------+ CFV      Full           Yes      Yes                                  +---------+---------------+---------+-----------+----------+---------------+ SFJ      Full                                                         +---------+---------------+---------+-----------+----------+---------------+  FV Prox  Full                                                         +---------+---------------+---------+-----------+----------+---------------+ FV Mid   Full                                                         +---------+---------------+---------+-----------+----------+---------------+ FV DistalFull                                                         +---------+---------------+---------+-----------+----------+---------------+ PFV      Full                                                         +---------+---------------+---------+-----------+----------+---------------+ POP      Full           Yes      Yes                                   +---------+---------------+---------+-----------+----------+---------------+ PTV      Full                                                         +---------+---------------+---------+-----------+----------+---------------+ PERO                                                  patent by color +---------+---------------+---------+-----------+----------+---------------+    Summary: RIGHT: - No evidence of common femoral vein obstruction.  LEFT: - There is no evidence of deep vein thrombosis in the lower extremity.  - No cystic structure found in the popliteal fossa. - Large well-encapsulated cystic structure identified in the proximal calf measuring 2.46cm x 4.09cm.  *See table(s) above for measurements and observations. Electronically signed by Waverly Ferrari MD on 09/16/2022 at 6:56:10 AM.    Final     Pending Labs Unresulted Labs (From admission, onward)     Start     Ordered   09/22/22 0500  Creatinine, serum  (enoxaparin (LOVENOX)    CrCl >/= 30 ml/min)  Weekly,   R     Comments: while on enoxaparin therapy    09/15/22 1937   09/17/22 0500  Comprehensive metabolic panel  Tomorrow morning,   R        09/16/22 0942   09/17/22 0500  CBC  Tomorrow morning,   R        09/16/22 9741  09/17/22 0500  Magnesium  Tomorrow morning,   R        09/16/22 0942   09/16/22 0938  Culture, fungus without smear  Once,   R        09/16/22 0937   09/16/22 0937  Body fluid cell count with differential  Once,   R       Question:  Are there also cytology or pathology orders on this specimen?  Answer:  No   09/16/22 0937   09/16/22 0937  Aerobic/Anaerobic Culture w Gram Stain (surgical/deep wound)  Once,   R        09/16/22 0937   09/15/22 2103  Culture, blood (Routine X 2) w Reflex to ID Panel  BLOOD CULTURE X 2,   R (with TIMED occurrences)     Question:  Patient immune status  Answer:  Normal   09/15/22 2102   09/15/22 1833  Aerobic/Anaerobic Culture w Gram Stain (surgical/deep wound)  Once,    URGENT       Question:  Patient immune status  Answer:  Normal   09/15/22 1833            Vitals/Pain Today's Vitals   09/16/22 0900 09/16/22 0909 09/16/22 1000 09/16/22 1100  BP: 128/79  (!) 133/102 (!) 164/64  Pulse: (!) 56  (!) 158 (!) 59  Resp: 19  11 16   Temp:  98.5 F (36.9 C)    TempSrc:  Oral    SpO2: 100%  100% 100%  Weight:      Height:      PainSc:        Isolation Precautions No active isolations  Medications Medications  enoxaparin (LOVENOX) injection 40 mg (40 mg Subcutaneous Given 09/15/22 2209)  atorvastatin (LIPITOR) tablet 20 mg (has no administration in time range)  cholecalciferol (VITAMIN D3) 25 MCG (1000 UNIT) tablet 1,000 Units (1,000 Units Oral Given 09/16/22 1015)  lamoTRIgine (LAMICTAL) tablet 100 mg (100 mg Oral Given 09/16/22 1015)  cefTRIAXone (ROCEPHIN) 2 g in sodium chloride 0.9 % 100 mL IVPB (0 g Intravenous Stopped 09/16/22 0046)  oxyCODONE (Oxy IR/ROXICODONE) immediate release tablet 5 mg (has no administration in time range)  senna-docusate (Senokot-S) tablet 1 tablet (1 tablet Oral Given 09/15/22 2204)  polyethylene glycol (MIRALAX / GLYCOLAX) packet 17 g (has no administration in time range)  lactated ringers infusion ( Intravenous New Bag/Given 09/16/22 0051)  vancomycin (VANCOCIN) IVPB 1000 mg/200 mL premix (has no administration in time range)  lisinopril (ZESTRIL) tablet 10 mg (10 mg Oral Given 09/16/22 1015)  acetaminophen (TYLENOL) tablet 650 mg (650 mg Oral Given 09/16/22 1119)  doxycycline (VIBRAMYCIN) 100 mg in sodium chloride 0.9 % 250 mL IVPB (0 mg Intravenous Stopped 09/16/22 0010)  vancomycin (VANCOREADY) IVPB 1500 mg/300 mL (0 mg Intravenous Stopped 09/16/22 0255)    Mobility manual wheelchair     Focused Assessments Peripheral Vascular, Musculoskeletal   R Recommendations: See Admitting Provider Note  Report given to:   Additional Notes:

## 2022-09-16 NOTE — ED Notes (Signed)
Patient reporting nausea. Dr. Cruzita Lederer notified via secure chat. Awaiting further orders

## 2022-09-16 NOTE — Progress Notes (Signed)
PROGRESS NOTE  [name] [name] ASN:[id number] DOB: June 03, 1941 DOA: 09/15/2022 PCP: Deatra James, MD   LOS: 0 days   Brief Narrative / Interim history: 82 year old female with history of prior CVA, HTN, HLD, CAD, seizure disorder who comes into the hospital with left lower extremity swelling, erythema and tenderness.  No trauma reported.  Imaging in the ED was negative for DVT however it did show a large, well encapsulated cystic structure in the proximal calf measuring 2.4 x 4 cm, concerning for an abscess.  She was placed on antibiotics, admitted to the hospital and IR was consulted for drainage  Subjective / 24h Interval events: She is doing well this morning.  No fever or chills this morning but felt feverish last night.  Assesement and Plan: Principal problem Left lower extremity proximal calf cyst -appreciate IR follow-up, she is going to have aspiration this morning.  She did have subjective fever and chills, concerning for abscess.  Continue broad-spectrum antibiotics, awaiting fluid cultures.  Active problems Essential hypertension-resume home lisinopril  Bradycardia-asymptomatic, 2D echo was ordered, pending.  Hold off home metoprolol  Seizure disorder-resume home Lamictal  Hyperlipidemia-continue home statin  History of CVA on dual antiplatelet therapy-hold due to planned abscess drainage.  Resume when okay with IR  Chronic diastolic CHF -Euvolemic on exam   Physical debility  -PT OT assessment   Scheduled Meds:  atorvastatin  20 mg Oral QPM   cholecalciferol  1,000 Units Oral Daily   enoxaparin (LOVENOX) injection  40 mg Subcutaneous Q24H   lamoTRIgine  100 mg Oral BID   senna-docusate  1 tablet Oral QHS   Continuous Infusions:  cefTRIAXone (ROCEPHIN)  IV Stopped (09/16/22 0046)   lactated ringers 50 mL/hr at 09/16/22 0051   vancomycin     PRN Meds:.oxyCODONE, polyethylene glycol  Current Outpatient Medications  Medication Instructions   acetaminophen (TYLENOL)  500-1,000 mg, Oral, Every 8 hours PRN   aspirin EC 81 mg, Oral, Daily, With Breakfast   atorvastatin (LIPITOR) 20 mg, Oral, Every evening   cholecalciferol (VITAMIN D) 1,000 Units, Oral, Daily   clopidogrel (PLAVIX) 75 mg, Oral, Daily   lamoTRIgine (LAMICTAL) 100 MG tablet TAKE 1 TABLET BY MOUTH TWICE DAILY   lisinopril (ZESTRIL) 20 mg, Oral, Daily at bedtime   metoprolol succinate (TOPROL-XL) 50 mg, Oral, Daily, Take with or immediately following a meal.   nitroGLYCERIN (NITROSTAT) 0.4 MG SL tablet DISSOLVE 1 TABLET UNDER THE TONGUE EVERY 5 MINUTES AS NEEDED FOR CHEST PAIN OR SHORTNESS OF BREATH   polyethylene glycol (MIRALAX / GLYCOLAX) 17 g, Oral, Daily PRN   polyethylene glycol (MIRALAX / GLYCOLAX) 17 g, Oral, Daily   senna-docusate (SENOKOT-S) 8.6-50 MG tablet 2 tablets, Oral, Daily at bedtime    Diet Orders (From admission, onward)     Start     Ordered   09/16/22 0001  Diet NPO time specified  Diet effective midnight        09/15/22 1939            DVT prophylaxis: enoxaparin (LOVENOX) injection 40 mg Start: 09/15/22 2200   Lab Results  Component Value Date   PLT 175 09/16/2022      Code Status: Full Code  Family Communication: no family at bedside   Status is: Observation The patient will require care spanning > 2 midnights and should be moved to inpatient because: Continue IV antibiotics pending cultures   Level of care: Telemetry Surgical  Consultants:  IR  Objective: Vitals:   09/16/22 0645 09/16/22  0700 09/16/22 0900 09/16/22 0909  BP:  (!) 141/71 128/79   Pulse: (!) 55 60 (!) 56   Resp: (!) 23 (!) 25 19   Temp:    98.5 F (36.9 C)  TempSrc:    Oral  SpO2: 100% 100% 100%   Weight:      Height:        Intake/Output Summary (Last 24 hours) at 09/16/2022 0932 Last data filed at 09/16/2022 0255 Gross per 24 hour  Intake 637.22 ml  Output --  Net 637.22 ml   Wt Readings from Last 3 Encounters:  09/15/22 78 kg  07/14/18 77.1 kg  06/29/18 77.1  kg    Examination:  Constitutional: NAD Eyes: no scleral icterus ENMT: Mucous membranes are moist.  Neck: normal, supple Respiratory: clear to auscultation bilaterally, no wheezing, no crackles. Normal respiratory effort. No accessory muscle use.  Cardiovascular: Regular rate and rhythm, no murmurs / rubs / gallops. No LE edema.  Abdomen: non distended, no tenderness. Bowel sounds positive.  Musculoskeletal: no clubbing / cyanosis.   Data Reviewed: I have independently reviewed following labs and imaging studies   CBC Recent Labs  Lab 09/15/22 1600 09/16/22 0425  WBC 4.6 4.9  HGB 12.6 11.1*  HCT 39.1 35.4*  PLT 200 175  MCV 89.1 91.2  MCH 28.7 28.6  MCHC 32.2 31.4  RDW 12.6 12.8  LYMPHSABS 1.3  --   MONOABS 0.5  --   EOSABS 0.1  --   BASOSABS 0.0  --     Recent Labs  Lab 09/15/22 1600 09/16/22 0425  NA 136 136  K 3.8 3.7  CL 97* 102  CO2 27 24  GLUCOSE 109* 94  BUN 18 14  CREATININE 1.08* 0.96  CALCIUM 9.7 8.9  MG  --  1.9    ------------------------------------------------------------------------------------------------------------------ No results for input(s): "CHOL", "HDL", "LDLCALC", "TRIG", "CHOLHDL", "LDLDIRECT" in the last 72 hours.  No results found for: "HGBA1C" ------------------------------------------------------------------------------------------------------------------ No results for input(s): "TSH", "T4TOTAL", "T3FREE", "THYROIDAB" in the last 72 hours.  Invalid input(s): "FREET3"  Cardiac Enzymes No results for input(s): "CKMB", "TROPONINI", "MYOGLOBIN" in the last 168 hours.  Invalid input(s): "CK" ------------------------------------------------------------------------------------------------------------------ No results found for: "BNP"  CBG: No results for input(s): "GLUCAP" in the last 168 hours.  Recent Results (from the past 240 hour(s))  MRSA Next Gen by PCR, Nasal     Status: None   Collection Time: 09/15/22 11:46 PM    Specimen: Nasal Mucosa; Nasal Swab  Result Value Ref Range Status   MRSA by PCR Next Gen NOT DETECTED NOT DETECTED Final    Comment: (NOTE) The GeneXpert MRSA Assay (FDA approved for NASAL specimens only), is one component of a comprehensive MRSA colonization surveillance program. It is not intended to diagnose MRSA infection nor to guide or monitor treatment for MRSA infections. Test performance is not FDA approved in patients less than 34 years old. Performed at [facility], [place] [address], [place], [place] [address]      Radiology Studies: VAS [place] LOWER EXTREMITY VENOUS (DVT) (ONLY [facility] & [facility])  Result Date: 09/16/2022  Lower Venous DVT Study Patient Name:  ADRIENA MANFRE [place]ia  Date of Exam:   09/15/2022 Medical Rec #: [id number]     Accession #:    [id number] Date of Birth: June 20, 1941     Patient Gender: F Patient Age:   27 years Exam [facility]:  [facility] Procedure:      VAS [place] LOWER EXTREMITY VENOUS (DVT) Referring  Phys: MARIAH GOWENS --------------------------------------------------------------------------------  Indications: Swelling, and Edema.  Comparison Study: no prior Performing Technologist: [place] Ponder RVS  Examination Guidelines: A complete evaluation includes B-mode imaging, spectral Doppler, color Doppler, and power Doppler as needed of all accessible portions of each vessel. Bilateral testing is considered an integral part of a complete examination. Limited examinations for reoccurring indications may be performed as noted. The reflux portion of the exam is performed with the patient in reverse Trendelenburg.  +-----+---------------+---------+-----------+----------+--------------+ RIGHTCompressibilityPhasicitySpontaneityPropertiesThrombus Aging +-----+---------------+---------+-----------+----------+--------------+ CFV  Full           Yes      Yes                                 +-----+---------------+---------+-----------+----------+--------------+    +---------+---------------+---------+-----------+----------+---------------+ LEFT     CompressibilityPhasicitySpontaneityPropertiesThrombus Aging  +---------+---------------+---------+-----------+----------+---------------+ CFV      Full           Yes      Yes                                  +---------+---------------+---------+-----------+----------+---------------+ SFJ      Full                                                         +---------+---------------+---------+-----------+----------+---------------+ FV Prox  Full                                                         +---------+---------------+---------+-----------+----------+---------------+ FV Mid   Full                                                         +---------+---------------+---------+-----------+----------+---------------+ FV DistalFull                                                         +---------+---------------+---------+-----------+----------+---------------+ PFV      Full                                                         +---------+---------------+---------+-----------+----------+---------------+ POP      Full           Yes      Yes                                  +---------+---------------+---------+-----------+----------+---------------+ PTV      Full                                                         +---------+---------------+---------+-----------+----------+---------------+  PERO                                                  patent by color +---------+---------------+---------+-----------+----------+---------------+    Summary: RIGHT: - No evidence of common femoral vein obstruction.  LEFT: - There is no evidence of deep vein thrombosis in the lower extremity.  - No cystic structure found in the popliteal fossa. - Large well-encapsulated cystic structure identified in the proximal calf measuring 2.46cm x 4.09cm.  *See table(s) above for  measurements and observations. Electronically signed by Deitra Mayo MD on 09/16/2022 at 6:56:10 AM.    Final      Marzetta Board, MD, PhD [facility]  Between 7 am - 7 pm I am available, please contact me via Amion (for emergencies) or Securechat (non urgent messages)  Between 7 pm - 7 am I am not available, please contact night coverage MD/APP via Amion

## 2022-09-16 NOTE — Progress Notes (Signed)
Pt admitted for foot abscess. Pt A&OX4, pt went down to IR for drain of foot. Pt on tele NSR.  BP (!) 194/73 (BP Location: Right Arm)   Pulse 69   Temp 98.4 F (36.9 C) (Oral)   Resp 18   Ht 5\' 9"  (1.753 m)   Wt 78 kg   SpO2 93%   BMI 25.40 kg/m  Pt had no c/o pain. Pt c/o n/v given zofran before a meal after IR visit. Louanne Skye 09/16/22 4:29 PM

## 2022-09-17 DIAGNOSIS — L0291 Cutaneous abscess, unspecified: Secondary | ICD-10-CM | POA: Diagnosis not present

## 2022-09-17 LAB — CBC
HCT: 37.2 % (ref 36.0–46.0)
Hemoglobin: 12.1 g/dL (ref 12.0–15.0)
MCH: 29.1 pg (ref 26.0–34.0)
MCHC: 32.5 g/dL (ref 30.0–36.0)
MCV: 89.4 fL (ref 80.0–100.0)
Platelets: 150 10*3/uL (ref 150–400)
RBC: 4.16 MIL/uL (ref 3.87–5.11)
RDW: 13 % (ref 11.5–15.5)
WBC: 4.5 10*3/uL (ref 4.0–10.5)
nRBC: 0 % (ref 0.0–0.2)

## 2022-09-17 LAB — COMPREHENSIVE METABOLIC PANEL
ALT: 15 U/L (ref 0–44)
AST: 21 U/L (ref 15–41)
Albumin: 3.3 g/dL — ABNORMAL LOW (ref 3.5–5.0)
Alkaline Phosphatase: 76 U/L (ref 38–126)
Anion gap: 10 (ref 5–15)
BUN: 7 mg/dL — ABNORMAL LOW (ref 8–23)
CO2: 24 mmol/L (ref 22–32)
Calcium: 9.1 mg/dL (ref 8.9–10.3)
Chloride: 102 mmol/L (ref 98–111)
Creatinine, Ser: 0.9 mg/dL (ref 0.44–1.00)
GFR, Estimated: >60 mL/min (ref >60)
Glucose, Bld: 88 mg/dL (ref 70–99)
Potassium: 3.7 mmol/L (ref 3.5–5.1)
Sodium: 136 mmol/L (ref 135–145)
Total Bilirubin: 0.6 mg/dL (ref 0.3–1.2)
Total Protein: 6.8 g/dL (ref 6.5–8.1)

## 2022-09-17 LAB — MAGNESIUM: Magnesium: 1.8 mg/dL (ref 1.7–2.4)

## 2022-09-17 MED ORDER — OXYCODONE HCL 5 MG PO TABS: 5.0000 mg | ORAL_TABLET | Freq: Four times a day (QID) | ORAL | 0 refills | Status: AC | PRN

## 2022-09-17 MED ORDER — CLOPIDOGREL BISULFATE 75 MG PO TABS: 75.0000 mg | ORAL_TABLET | Freq: Every day | ORAL | 5 refills | Status: AC

## 2022-09-17 NOTE — Evaluation (Addendum)
Occupational Therapy Evaluation Patient Details Name: [name] [name] MRN: [id number] DOB: March 05, 1941 Today's Date: 09/17/2022   History of Present Illness 82 y.o. female adm 1/16 with medical history significant for seizure disorder, history of stroke, hyperlipidemia, hypertension, coronary artery disease, who presented from home due to left lower extremity, calf, edema, erythema and tenderness.  S/p Left lower extremity fluid collection aspiration 1/17.   Clinical Impression   Patient admitted for the diagnosis and subsequent procedure above.  PTA, per the patient, she lives at home with her daughter, but the daughter is not available during the day to assist.  Patient stating she relies on herself during the day.  Patient typically does stand pivots to wheelchair, and performs sponge baths.  Currently she is needing up to Max A for stand pivot transfers and lower body ADL.  Patient is struggling in the acute environment, stating nothing is set up for her, and this may be impacting her functional performance.  OT will follow in the acute setting to address deficits, and assist with eventual transition to SNF vs home.  If the patient has the needed initial 24 hour assist at home, and the daughter can provide, then home is a possibility.  OT has not spoken to the daughter to ascertain a true home baseline.  SNF may be needed depending on progress.         Recommendations for follow up therapy are one component of a multi-disciplinary discharge planning process, led by the attending physician.  Recommendations may be updated based on patient status, additional functional criteria and insurance authorization.   Follow Up Recommendations  Other (comment) (SNF versus home with 24 hour assist.) Patient would benefit from SNF and is agreeable.      Assistance Recommended at Discharge Frequent or constant Supervision/Assistance  Patient can return home with the following Help with stairs or ramp for  entrance;Assistance with cooking/housework;Assist for transportation;Direct supervision/assist for financial management;Direct supervision/assist for medications management;A lot of help with bathing/dressing/bathroom;A lot of help with walking and/or transfers    Functional Status Assessment  Patient has had a recent decline in their functional status and demonstrates the ability to make significant improvements in function in a reasonable and predictable amount of time.  Equipment Recommendations  None recommended by OT    Recommendations for Other Services       Precautions / Restrictions Precautions Precautions: Fall Precaution Comments: L hemiparesis. Restrictions Weight Bearing Restrictions: No      Mobility Bed Mobility Overal bed mobility: Needs Assistance Bed Mobility: Supine to Sit     Supine to sit: Mod assist, HOB elevated     General bed mobility comments: increased time    Transfers Overall transfer level: Needs assistance Equipment used: None Transfers: Sit to/from Stand, Bed to chair/wheelchair/BSC Sit to Stand: Mod assist, Max assist   Squat pivot transfers: Mod assist, Max assist              Balance Overall balance assessment: Needs assistance Sitting-balance support: Single extremity supported, Feet supported Sitting balance-Leahy Scale: Fair   Postural control: Right lateral lean Standing balance support: Bilateral upper extremity supported Standing balance-Leahy Scale: Poor                             ADL either performed or assessed with clinical judgement   ADL Overall ADL's : Needs assistance/impaired Eating/Feeding: Set up;Bed level   Grooming: Wash/dry hands;Wash/dry face;Set up;Sitting   Upper Body  Bathing: Moderate assistance;Sitting;Minimal assistance   Lower Body Bathing: Moderate assistance;Maximal assistance;Sitting/lateral leans   Upper Body Dressing : Minimal assistance;Moderate assistance;Sitting    Lower Body Dressing: Moderate assistance;Maximal assistance;Sitting/lateral leans   Toilet Transfer: Squat-pivot;Moderate assistance;Maximal assistance                   Vision Patient Visual Report: No change from baseline       Perception     Praxis      Pertinent Vitals/Pain Pain Assessment Pain Assessment: Faces Faces Pain Scale: Hurts little more Pain Location: R arm at IV site Pain Descriptors / Indicators: Tender Pain Intervention(s): Monitored during session     Hand Dominance Right   Extremity/Trunk Assessment Upper Extremity Assessment Upper Extremity Assessment: LUE deficits/detail LUE Deficits / Details: contracted: 90 degree at elbow, and fisted hand.  Has splint, but dies not wear. LUE Sensation: decreased light touch LUE Coordination: decreased fine motor;decreased gross motor   Lower Extremity Assessment Lower Extremity Assessment: Defer to PT evaluation   Cervical / Trunk Assessment Cervical / Trunk Assessment: Kyphotic   Communication Communication Communication: No difficulties   Cognition Arousal/Alertness: Awake/alert Behavior During Therapy: Restless, Flat affect Overall Cognitive Status: No family/caregiver present to determine baseline cognitive functioning                                 General Comments: Patient not oriented to place and date/time.  Follows one step commands well, ? historian, recent memory deficits.     General Comments   VSS on RA    Exercises     Shoulder Instructions      Home Living Family/patient expects to be discharged to:: Private residence Living Arrangements: Children Available Help at Discharge: Family;Available PRN/intermittently Type of Home: House Home Access: Stairs to enter [employer] of Steps: 3-4 Entrance Stairs-Rails: Right Home Layout: One level     Bathroom Shower/Tub: Tub/shower unit;Sponge bathes at baseline   [employer]: Standard Bathroom  Accessibility: Yes How Accessible: Accessible via walker Home Equipment: Cane - quad;Shower seat;Wheelchair - manual          Prior Functioning/Environment Prior Level of Function : Needs assist             Mobility Comments: Patient stating she uses a quad cane at baseline for stand pivots, states she has a wheelchair ADLs Comments: States daughter assists with ADL, iADL, home mangement, medications and community mobility.        OT Problem List: Decreased strength;Decreased range of motion;Impaired balance (sitting and/or standing);Pain;Decreased safety awareness      OT Treatment/Interventions: Self-care/ADL training;Therapeutic activities;DME and/or AE instruction;Patient/family education;Balance training    OT Goals(Current goals can be found in the care plan section) Acute Rehab OT Goals Patient Stated Goal: Go home OT Goal Formulation: With patient Time For Goal Achievement: 10/01/22 Potential to Achieve Goals: Good ADL Goals Pt Will Perform Upper Body Dressing: with set-up;sitting Pt Will Perform Lower Body Dressing: sitting/lateral leans;with min assist Pt Will Transfer to Toilet: with min assist;stand pivot transfer;bedside commode  OT Frequency: Min 2X/week    Co-evaluation              AM-PAC OT "6 Clicks" Daily Activity     Outcome Measure Help from another person eating meals?: A Little Help from another person taking care of personal grooming?: A Little Help from another person toileting, which includes using toliet, bedpan, or urinal?: A Lot  Help from another person bathing (including washing, rinsing, drying)?: A Lot Help from another person to put on and taking off regular upper body clothing?: A Lot Help from another person to put on and taking off regular lower body clothing?: A Lot 6 Click Score: 14   End of Session Equipment Utilized During Treatment: Gait belt Nurse Communication: Mobility status  Activity Tolerance: Patient tolerated  treatment well Patient left: in chair;with call bell/phone within reach;with chair alarm set  OT Visit Diagnosis: Unsteadiness on feet (R26.81);Pain;Muscle weakness (generalized) (M62.81) Pain - Right/Left: Right Pain - part of body: Arm                Time: 1761-6073 OT Time Calculation (min): 20 min Charges:  OT General Charges $OT Visit: 1 Visit OT Evaluation $OT Eval Moderate Complexity: 1 Mod  09/17/2022  RP, OTR/L  Acute Rehabilitation Services  Office:  [phone]   Metta Clines 09/17/2022, 8:55 AM

## 2022-09-17 NOTE — Progress Notes (Signed)
Physical Therapy Evaluation and Discharge Patient Details Name: [name] [name] MRN: [id number] DOB: [birth year] Today's Date: 09/17/2022  History of Present Illness  82 y.o. female adm 1/16 with medical history significant for seizure disorder, history of stroke, hyperlipidemia, hypertension, coronary artery disease, who presented from home due to left lower extremity, calf, edema, erythema and tenderness.  S/p Left lower extremity fluid collection aspiration 1/17.  Clinical Impression  Patient evaluated by Physical Therapy with no further acute PT needs identified. All education has been completed and the patient has no further questions. Spoke with daughter over the phone - patient required Mod assist with transfers today. Daughter states patient always has increased trouble outside of the home with mobility and is set-up to do things easily on her own for the most part at home. Initially suggested SNF, discussed with patient however daughter prefers home without HHPT due to her work schedule. Daughter states she can adequately care for her mother at home in a familiar setting. Has all DME needs. Told daughter if she felt HHPT would be appropriate after D/c and she can arrange a more convenient schedule to speak with PCP about HHPT referral. PT is signing off. Thank you for this referral.        Recommendations for follow up therapy are one component of a multi-disciplinary discharge planning process, led by the attending physician.  Recommendations may be updated based on patient status, additional functional criteria and insurance authorization.  Follow Up Recommendations No PT follow up (Dtr declines)      Assistance Recommended at Discharge Intermittent Supervision/Assistance  Patient can return home with the following  A lot of help with walking and/or transfers;A lot of help with bathing/dressing/bathroom;Assistance with cooking/housework;Direct supervision/assist for medications  management;Direct supervision/assist for financial management;Assist for transportation;Help with stairs or ramp for entrance    Equipment Recommendations None recommended by PT  Recommendations for Other Services       Functional Status Assessment Patient has had a recent decline in their functional status and/or demonstrates limited ability to make significant improvements in function in a reasonable and predictable amount of time     Precautions / Restrictions Precautions Precautions: Fall Precaution Comments: L hemiparesis. Restrictions Weight Bearing Restrictions: No      Mobility  Bed Mobility Overal bed mobility: Needs Assistance Bed Mobility: Sit to Supine       Sit to supine: Min assist   General bed mobility comments: Min assist for LEs back into bed. Pt controls trunk    Transfers Overall transfer level: Needs assistance Equipment used: None Transfers: Bed to chair/wheelchair/BSC       Squat pivot transfers: Mod assist     General transfer comment: Attempted sit<>stand transfer from [facility], pt impulsive attempting to get back into bed. Resolved with squat pivot transfer, heavy mod assist, pt leaning heavily towards Rt, not responsive to cues for safer sequencing.    Ambulation/Gait                  Stairs            Wheelchair Mobility    Modified Rankin (Stroke Patients Only)       Balance Overall balance assessment: Needs assistance Sitting-balance support: Single extremity supported, Feet supported Sitting balance-Leahy Scale: Poor   Postural control: Right lateral lean, Posterior lean  Pertinent Vitals/Pain Pain Assessment Pain Assessment: Faces Faces Pain Scale: Hurts little more Pain Location: R arm at IV site Pain Descriptors / Indicators: Tender Pain Intervention(s): Monitored during session    Home Living Family/patient expects to be discharged to:: Private  residence Living Arrangements: Children Available Help at Discharge: Family;Available PRN/intermittently Type of Home: House Home Access: Stairs to enter Entrance Stairs-Rails: Right Entrance Stairs-Number of Steps: 3-4   Home Layout: One level Home Equipment: Cane - quad;Shower seat;Wheelchair - manual;BSC/3in1      Prior Function Prior Level of Function : Needs assist             Mobility Comments: Patient stating she uses a quad cane at baseline for stand pivots, states she has a wheelchair ADLs Comments: States daughter assists with ADL, iADL, home mangement, medications and community mobility.     Hand Dominance   Dominant Hand: Right    Extremity/Trunk Assessment   Upper Extremity Assessment Upper Extremity Assessment: Defer to OT evaluation LUE Deficits / Details: contracted: 90 degree at elbow, and fisted hand.  Has splint, but dies not wear. LUE Sensation: decreased light touch LUE Coordination: decreased fine motor;decreased gross motor    Lower Extremity Assessment Lower Extremity Assessment: Generalized weakness (Hx weakness LLE)    Cervical / Trunk Assessment Cervical / Trunk Assessment: Kyphotic  Communication   Communication: No difficulties  Cognition Arousal/Alertness: Awake/alert Behavior During Therapy: Flat affect Overall Cognitive Status: No family/caregiver present to determine baseline cognitive functioning                                 General Comments: Patient not oriented to place and date/time.  Follows one step commands well, ? historian, recent memory deficits.        General Comments General comments (skin integrity, edema, etc.): Spoke with daughter over the phone, described Mod A level with transfer. Dtr reports she can care for pt.    Exercises     Assessment/Plan    PT Assessment Patient does not need any further PT services  PT Problem List         PT Treatment Interventions      PT Goals (Current  goals can be found in the Care Plan section)  Acute Rehab PT Goals Patient Stated Goal: go home PT Goal Formulation: With family    Frequency       Co-evaluation               AM-PAC PT "6 Clicks" Mobility  Outcome Measure Help needed turning from your back to your side while in a flat bed without using bedrails?: A Little Help needed moving from lying on your back to sitting on the side of a flat bed without using bedrails?: A Little Help needed moving to and from a bed to a chair (including a wheelchair)?: A Lot Help needed standing up from a chair using your arms (e.g., wheelchair or bedside chair)?: A Lot Help needed to walk in hospital room?: Total Help needed climbing 3-5 steps with a railing? : Total 6 Click Score: 12    End of Session Equipment Utilized During Treatment: Gait belt Activity Tolerance: Patient tolerated treatment well Patient left: in bed;with call bell/phone within reach;with bed alarm set   PT Visit Diagnosis: Muscle weakness (generalized) (M62.81);Difficulty in walking, not elsewhere classified (R26.2)    Time: 3086-5784 PT Time Calculation (min) (ACUTE ONLY): 15 min   Charges:  PT Evaluation $PT Eval Low Complexity: 1 Low          Candie Mile, PT, DPT Physical Therapist Acute Rehabilitation Services [facility]   Ellouise Newer 09/17/2022, 11:54 AM

## 2022-09-17 NOTE — Discharge Summary (Signed)
Physician Discharge Summary  [name] [name] JGO:[id number] DOB: [birth year] DOA: 09/15/2022  PCP: Deatra James, MD  Admit date: 09/15/2022 Discharge date: 09/17/2022  Admitted From: home Disposition:  home  Recommendations for Outpatient Follow-up:  Follow up with PCP in 1-2 weeks  Home Health: none Equipment/Devices: none  Discharge Condition: stable CODE STATUS: Full code Diet Orders (From admission, onward)     Start     Ordered   09/16/22 1546  Diet regular Room service appropriate? Yes; Fluid consistency: Thin  Diet effective now       Question Answer Comment  Room service appropriate? Yes   Fluid consistency: Thin      09/16/22 1545            HPI: Per admitting MD, [name] [name] is a 82 y.o. female with medical history significant for seizure disorder, history of stroke, hyperlipidemia, hypertension, coronary artery disease, who presented to [facility] ED from home due to left lower extremity, calf, edema, erythema and tenderness.  Denies any trauma.  No reported subjective fevers or chills. In the ED, venous Doppler ultrasound was negative for DVT however showed large well encapsulated cystic structure identified in the proximal calf measuring 2.46 cm x 4.09 cm with concern for an abscess.  EDP discussed the case with interventional radiology.  Plan for aspiration tomorrow.  Recommended observation admission for IV antibiotics overnight. The patient was seen and examined in the ED.  Her daughter was present at the time of examination.  The patient had no new complaints.  CODE STATUS is full code.  Daughter requests fall precautions due to high fall risk.  Hospital Course / Discharge diagnoses: Principal problem Left lower extremity proximal calf hematoma -patient was admitted to the hospital with suspected left lower extremity proximal calf abscess.  She came in with swelling and pain, an ultrasound was negative for DVT but it did show fluid collection.  She was afebrile and had no  leukocytosis.  She was empirically placed on antibiotics and IR was consulted and underwent drainage, which showed a small amount of serosanguineous fluid, consistent with hematoma.  There were no organisms seen.  This is less likely an infectious process and probably either a spontaneous hematoma or some mild trauma at home in the setting of her being on dual antiplatelet agents.  She is able to work well with PT, will be discharged home in stable condition  Active problems Essential hypertension-resume home medications  Bradycardia-asymptomatic, chronic based on prior outpatient vital sings Seizure disorder-continue home medications  Hyperlipidemia-continue home statin History of CVA on dual antiplatelet therapy-resume aspirin, hold Plavix for week Chronic diastolic CHF -Euvolemic on exam Physical debility  -PT OT assessment  Sepsis ruled out   Discharge Instructions   Allergies as of 09/17/2022       Reactions   Latex Rash   Penicillins Rash   Has patient had a PCN reaction causing immediate rash, facial/tongue/throat swelling, SOB or lightheadedness with hypotension: No Has patient had a PCN reaction causing severe rash involving mucus membranes or skin necrosis: No Has patient had a PCN reaction that required hospitalization No Has patient had a PCN reaction occurring within the last 10 years: No If all of the above answers are "NO", then may proceed with Cephalosporin use.   Tape Rash        Medication List     TAKE these medications    acetaminophen 500 MG tablet Commonly known as: TYLENOL Take 1-2 tablets (500-1,000 mg total)  by mouth every 8 (eight) hours as needed for mild pain.   aspirin EC 81 MG tablet Take 1 tablet (81 mg total) by mouth daily. With Breakfast   atorvastatin 20 MG tablet Commonly known as: LIPITOR Take 1 tablet (20 mg total) by mouth every evening.   cholecalciferol 1000 units tablet Commonly known as: VITAMIN D Take 1 tablet (1,000 Units  total) by mouth daily.   clopidogrel 75 MG tablet Commonly known as: PLAVIX Take 1 tablet (75 mg total) by mouth daily. Start taking on: September 24, 2022 What changed: These instructions start on September 24, 2022. If you are unsure what to do until then, ask your doctor or other care provider.   lamoTRIgine 100 MG tablet Commonly known as: LAMICTAL TAKE 1 TABLET BY MOUTH TWICE DAILY   lisinopril 20 MG tablet Commonly known as: ZESTRIL Take 1 tablet (20 mg total) by mouth at bedtime.   metoprolol succinate 50 MG 24 hr tablet Commonly known as: TOPROL-XL Take 1 tablet (50 mg total) by mouth daily. Take with or immediately following a meal. What changed: how much to take   nitroGLYCERIN 0.4 MG SL tablet Commonly known as: NITROSTAT DISSOLVE 1 TABLET UNDER THE TONGUE EVERY 5 MINUTES AS NEEDED FOR CHEST PAIN OR SHORTNESS OF BREATH What changed: See the new instructions.   oxyCODONE 5 MG immediate release tablet Commonly known as: Oxy IR/ROXICODONE Take 1 tablet (5 mg total) by mouth every 6 (six) hours as needed for moderate pain or breakthrough pain.   polyethylene glycol 17 g packet Commonly known as: MIRALAX / GLYCOLAX Take 17 g by mouth daily as needed for mild constipation.   polyethylene glycol 17 g packet Commonly known as: MIRALAX / GLYCOLAX Take 17 g by mouth daily.   senna-docusate 8.6-50 MG tablet Commonly known as: Senokot-S Take 2 tablets by mouth at bedtime.       Consultations: IR  Procedures/Studies:  IR US Guide Bx Asp/Drain  Result Date: 09/16/2022 INDICATION: 82 year old female with left calf fluid collection concerning for abscess. EXAM: Ultrasound-guided aspiration ANESTHESIA/SEDATION: None. COMPLICATIONS: None immediate. PROCEDURE: Informed written consent was obtained from the patient after a thorough discussion of the procedural risks, benefits and alternatives. All questions were addressed. Maximal Sterile Barrier Technique was utilized  including caps, mask, sterile gowns, sterile gloves, sterile drape, hand hygiene and skin antiseptic. A timeout was performed prior to the initiation of the procedure. Preprocedure ultrasound evaluation demonstrated a subcutaneous ovoid, heterogeneously hypoechoic fluid collection in the medial aspect of the left lower leg. Color Doppler evaluation demonstrated no evidence of vascularity. The procedure was planned. Subdermal Local anesthesia was provided with 1% lidocaine. Under direct ultrasound visualization, a 10 cm Yueh needle was directed into the central aspect of the fluid collection. Aspiration was performed. Scant dark sanguinous fluid was aspirated. A proximally 2 mL were sent for culture. The Yueh catheter was removed and hemostasis was achieved with brief manual compression. A sterile bandage was applied. The patient tolerated procedure well and was returned to the floor in good condition. IMPRESSION: Minimal aspirate from heterogeneous medial left lower leg fluid collection compatible with hematoma. Ruthann Cancer, MD [facility] Radiology Electronically Signed   By: Ruthann Cancer M.D.   On: 09/16/2022 16:06   VAS [place] LOWER EXTREMITY VENOUS (DVT) (ONLY [facility] & [facility])  Result Date: 09/16/2022  Lower Venous DVT Study Patient Name:  TYONA NILSEN [place]  Date of Exam:   09/15/2022 Medical Rec #: [id number]  Accession #:    [id number] Date of Birth: [birth year]     Patient Gender: F Patient Age:   75 years Exam Location:  [facility] Procedure:      VAS [place] LOWER EXTREMITY VENOUS (DVT) Referring Phys: [facility] GOWENS --------------------------------------------------------------------------------  Indications: Swelling, and Edema.  Comparison Study: no prior Performing Technologist: Archie Patten RVS  Examination Guidelines: A complete evaluation includes B-mode imaging, spectral Doppler, color Doppler, and power Doppler as needed of all accessible portions of each  vessel. Bilateral testing is considered an integral part of a complete examination. Limited examinations for reoccurring indications may be performed as noted. The reflux portion of the exam is performed with the patient in reverse Trendelenburg.  +-----+---------------+---------+-----------+----------+--------------+ RIGHTCompressibilityPhasicitySpontaneityPropertiesThrombus Aging +-----+---------------+---------+-----------+----------+--------------+ CFV  Full           Yes      Yes                                 +-----+---------------+---------+-----------+----------+--------------+   +---------+---------------+---------+-----------+----------+---------------+ LEFT     CompressibilityPhasicitySpontaneityPropertiesThrombus Aging  +---------+---------------+---------+-----------+----------+---------------+ CFV      Full           Yes      Yes                                  +---------+---------------+---------+-----------+----------+---------------+ SFJ      Full                                                         +---------+---------------+---------+-----------+----------+---------------+ FV Prox  Full                                                         +---------+---------------+---------+-----------+----------+---------------+ FV Mid   Full                                                         +---------+---------------+---------+-----------+----------+---------------+ FV DistalFull                                                         +---------+---------------+---------+-----------+----------+---------------+ PFV      Full                                                         +---------+---------------+---------+-----------+----------+---------------+ POP      Full           Yes      Yes                                  +---------+---------------+---------+-----------+----------+---------------+  PTV      Full                                                          +---------+---------------+---------+-----------+----------+---------------+ PERO                                                  patent by color +---------+---------------+---------+-----------+----------+---------------+    Summary: RIGHT: - No evidence of common femoral vein obstruction.  LEFT: - There is no evidence of deep vein thrombosis in the lower extremity.  - No cystic structure found in the popliteal fossa. - Large well-encapsulated cystic structure identified in the proximal calf measuring 2.46cm x 4.09cm.  *See table(s) above for measurements and observations. Electronically signed by Waverly Ferrarihristopher Dickson MD on 09/16/2022 at 6:56:10 AM.    Final      Subjective: - no chest pain, shortness of breath, no abdominal pain, nausea or vomiting.   Discharge Exam: BP (!) 157/75 (BP Location: Right Arm)   Pulse 79   Temp 98.4 F (36.9 C)   Resp 18   Ht 5\' 9"  (1.753 m)   Wt 78 kg   SpO2 92%   BMI 25.40 kg/m   General: Pt is alert, awake, not in acute distress Cardiovascular: RRR, S1/S2 +, no rubs, no gallops Respiratory: CTA bilaterally, no wheezing, no rhonchi Abdominal: Soft, NT, ND, bowel sounds + Extremities: no edema, no cyanosis    The results of significant diagnostics from this hospitalization (including imaging, microbiology, ancillary and laboratory) are listed below for reference.     Microbiology: Recent Results (from the past 240 hour(s))  MRSA Next Gen by PCR, Nasal     Status: None   Collection Time: 09/15/22 11:46 PM   Specimen: Nasal Mucosa; Nasal Swab  Result Value Ref Range Status   MRSA by PCR Next Gen NOT DETECTED NOT DETECTED Final    Comment: (NOTE) The GeneXpert MRSA Assay (FDA approved for NASAL specimens only), is one component of a comprehensive MRSA colonization surveillance program. It is not intended to diagnose MRSA infection nor to guide or monitor treatment for MRSA infections. Test  performance is not FDA approved in patients less than 80 years old. Performed at [facility], [address] [place], [place] [address]   Aerobic/Anaerobic Culture w Gram Stain (surgical/deep wound)     Status: None (Preliminary result)   Collection Time: 09/16/22  9:37 AM   Specimen: Abscess  Result Value Ref Range Status   Specimen Description ABSCESS  Final   Special Requests LEFT LEG  Final   Gram Stain   Final    FEW WBC PRESENT, PREDOMINANTLY PMN NO ORGANISMS SEEN    Culture   Final    NO GROWTH < 24 HOURS Performed at [facility], [address] [place], [place] [address]    Report Status PENDING  Incomplete     Labs: Basic Metabolic Panel: Recent Labs  Lab 09/15/22 1600 09/16/22 0425 09/17/22 0649  NA 136 136 136  K 3.8 3.7 3.7  CL 97* 102 102  CO2 27 24 24   GLUCOSE 109* 94 88  BUN 18 14 7*  CREATININE  1.08* 0.96 0.90  CALCIUM 9.7 8.9 9.1  MG  --  1.9 1.8  PHOS  --  2.8  --    Liver Function Tests: Recent Labs  Lab 09/17/22 0649  AST 21  ALT 15  ALKPHOS 76  BILITOT 0.6  PROT 6.8  ALBUMIN 3.3*   CBC: Recent Labs  Lab 09/15/22 1600 09/16/22 0425 09/17/22 0649  WBC 4.6 4.9 4.5  NEUTROABS 2.8  --   --   HGB 12.6 11.1* 12.1  HCT 39.1 35.4* 37.2  MCV 89.1 91.2 89.4  PLT 200 175 150   CBG: No results for input(s): "GLUCAP" in the last 168 hours. Hgb A1c No results for input(s): "HGBA1C" in the last 72 hours. Lipid Profile No results for input(s): "CHOL", "HDL", "LDLCALC", "TRIG", "CHOLHDL", "LDLDIRECT" in the last 72 hours. Thyroid function studies No results for input(s): "TSH", "T4TOTAL", "T3FREE", "THYROIDAB" in the last 72 hours.  Invalid input(s): "FREET3" Urinalysis    Component Value Date/Time   COLORURINE YELLOW 10/06/2018 0040   APPEARANCEUR HAZY (A) 10/06/2018 0040   LABSPEC 1.017 10/06/2018 0040   PHURINE 6.0 10/06/2018 0040   GLUCOSEU NEGATIVE 10/06/2018 0040   HGBUR MODERATE (A) 10/06/2018 0040    BILIRUBINUR NEGATIVE 10/06/2018 0040   KETONESUR NEGATIVE 10/06/2018 0040   PROTEINUR NEGATIVE 10/06/2018 0040   UROBILINOGEN 1.0 04/02/2015 1043   NITRITE NEGATIVE 10/06/2018 0040   LEUKOCYTESUR NEGATIVE 10/06/2018 0040    FURTHER DISCHARGE INSTRUCTIONS:   Get Medicines reviewed and adjusted: Please take all your medications with you for your next visit with your Primary MD   Laboratory/radiological data: Please request your Primary MD to go over all hospital tests and procedure/radiological results at the follow up, please ask your Primary MD to get all Hospital records sent to his/her office.   In some cases, they will be blood work, cultures and biopsy results pending at the time of your discharge. Please request that your primary care M.D. goes through all the records of your hospital data and follows up on these results.   Also Note the following: If you experience worsening of your admission symptoms, develop shortness of breath, life threatening emergency, suicidal or homicidal thoughts you must seek medical attention immediately by calling 911 or calling your MD immediately  if symptoms less severe.   You must read complete instructions/literature along with all the possible adverse reactions/side effects for all the Medicines you take and that have been prescribed to you. Take any new Medicines after you have completely understood and accpet all the possible adverse reactions/side effects.    Do not drive when taking Pain medications or sleeping medications (Benzodaizepines)   Do not take more than prescribed Pain, Sleep and Anxiety Medications. It is not advisable to combine anxiety,sleep and pain medications without talking with your primary care practitioner   Special Instructions: If you have smoked or chewed Tobacco  in the last 2 yrs please stop smoking, stop any regular Alcohol  and or any Recreational drug use.   Wear Seat belts while driving.   Please note: You were  cared for by a hospitalist during your hospital stay. Once you are discharged, your primary care physician will handle any further medical issues. Please note that NO REFILLS for any discharge medications will be authorized once you are discharged, as it is imperative that you return to your primary care physician (or establish a relationship with a primary care physician if you do not have one) for your post hospital discharge  needs so that they can reassess your need for medications and monitor your lab values.  Time coordinating discharge: 40 minutes  SIGNED:  Pamella Pert, MD, PhD 09/17/2022, 11:00 AM

## 2022-09-17 NOTE — TOC Transition Note (Signed)
Transition of Care [facility]) - CM/SW Discharge Note   Patient Details  Name: [name] [name] MRN: [id number] Date of Birth: 1940-10-19  [facility] CM/SW Contact:  Tom-Johnson, Renea Ee, RN Phone Number: 09/17/2022, 12:57 PM   Clinical Narrative:     Patient is scheduled for discharge today. SNF recommended by PT/OT, daughter declined, requesting Home Health.  CM called and spoke with daughter [name] [name] about home health choices. [name] [name] states patient does well in her own surroundings and does not need PT/OT. States patient need someone that will be there hours that she will not be available during the daytime.  CM informed [name] [name] that will be Personal care services and patient's Medicare does not pay for it. Patient will have to pay out of pocket or apply for Medicaid. [name] [name] states patient will not qualify for Medicaid as patient lives in both [place] and [place]. CM spoke with [name] [name] about [facility] and she states she had reached out to them before and was told patient was not eligible. Sherry declined [facility] at this time, will reach out to patient's MD if need arises.  Sherry to transport at discharge.  No further TOC needs noted.         Final next level of care: Home w Home Health Services Barriers to Discharge: Barriers Resolved   Patient Goals and CMS Choice CMS Medicare.gov Compare Post Acute Care list provided to:: Patient Choice offered to / list presented to : Patient, Adult Children (Daughter, [name] [name])  Discharge Placement                  Patient to be transferred to facility by: Daughter      Discharge Plan and Services Additional resources added to the After Visit Summary for                  DME Arranged: N/A DME Agency: NA       HH Arranged: Refused HH, Refused SNF (Daughter declined both SNF and Home Health) [place]: NA        Social Determinants of Health (SDOH) Interventions SDOH Screenings   Food Insecurity: No Food Insecurity  (09/16/2022)  Housing: Low Risk  (09/16/2022)  Transportation Needs: No Transportation Needs (09/16/2022)  Utilities: Not At Risk (09/16/2022)  Tobacco Use: Low Risk  (09/16/2022)     Readmission Risk Interventions     No data to display

## 2022-09-17 NOTE — Discharge Instructions (Signed)
Hold Plavix for a week, resume 1/25.  Continue Aspirin meanwhile  Follow with Donald Prose, MD in 5-7 days  Please get a complete blood count and chemistry panel checked by your Primary MD at your next visit, and again as instructed by your Primary MD. Please get your medications reviewed and adjusted by your Primary MD.  Please request your Primary MD to go over all Hospital Tests and Procedure/Radiological results at the follow up, please get all Hospital records sent to your Prim MD by signing hospital release before you go home.  In some cases, there will be blood work, cultures and biopsy results pending at the time of your discharge. Please request that your primary care M.D. goes through all the records of your hospital data and follows up on these results.  If you had Pneumonia of Lung problems at the Hospital: Please get a 2 view Chest X ray done in 6-8 weeks after hospital discharge or sooner if instructed by your Primary MD.  If you have Congestive Heart Failure: Please call your Cardiologist or Primary MD anytime you have any of the following symptoms:  1) 3 pound weight gain in 24 hours or 5 pounds in 1 week  2) shortness of breath, with or without a dry hacking cough  3) swelling in the hands, feet or stomach  4) if you have to sleep on extra pillows at night in order to breathe  Follow cardiac low salt diet and 1.5 lit/day fluid restriction.  If you have diabetes Accuchecks 4 times/day, Once in AM empty stomach and then before each meal. Log in all results and show them to your primary doctor at your next visit. If any glucose reading is under 80 or above 300 call your primary MD immediately.  If you have Seizure/Convulsions/Epilepsy: Please do not drive, operate heavy machinery, participate in activities at heights or participate in high speed sports until you have seen by Primary MD or a Neurologist and advised to do so again. Per [facility] statutes, patients  with seizures are not allowed to drive until they have been seizure-free for six months.  Use caution when using heavy equipment or power tools. Avoid working on ladders or at heights. Take showers instead of baths. Ensure the water temperature is not too high on the home water heater. Do not go swimming alone. Do not lock yourself in a room alone (i.e. bathroom). When caring for infants or small children, sit down when holding, feeding, or changing them to minimize risk of injury to the child in the event you have a seizure. Maintain good sleep hygiene. Avoid alcohol.   If you had Gastrointestinal Bleeding: Please ask your Primary MD to check a complete blood count within one week of discharge or at your next visit. Your endoscopic/colonoscopic biopsies that are pending at the time of discharge, will also need to followed by your Primary MD.  Get Medicines reviewed and adjusted. Please take all your medications with you for your next visit with your Primary MD  Please request your Primary MD to go over all hospital tests and procedure/radiological results at the follow up, please ask your Primary MD to get all Hospital records sent to his/her office.  If you experience worsening of your admission symptoms, develop shortness of breath, life threatening emergency, suicidal or homicidal thoughts you must seek medical attention immediately by calling 911 or calling your MD immediately  if symptoms less severe.  You must read complete instructions/literature along with  all the possible adverse reactions/side effects for all the Medicines you take and that have been prescribed to you. Take any new Medicines after you have completely understood and accpet all the possible adverse reactions/side effects.   Do not drive or operate heavy machinery when taking Pain medications.   Do not take more than prescribed Pain, Sleep and Anxiety Medications  Special Instructions: If you have smoked or chewed Tobacco   in the last 2 yrs please stop smoking, stop any regular Alcohol  and or any Recreational drug use.  Wear Seat belts while driving.  Please note You were cared for by a hospitalist during your hospital stay. If you have any questions about your discharge medications or the care you received while you were in the hospital after you are discharged, you can call the unit and asked to speak with the hospitalist on call if the hospitalist that took care of you is not available. Once you are discharged, your primary care physician will handle any further medical issues. Please note that NO REFILLS for any discharge medications will be authorized once you are discharged, as it is imperative that you return to your primary care physician (or establish a relationship with a primary care physician if you do not have one) for your aftercare needs so that they can reassess your need for medications and monitor your lab values.  You can reach the hospitalist office at phone [phone] or fax [phone]   If you do not have a primary care physician, you can call [phone] for a physician referral.  Activity: As tolerated with Full fall precautions use walker/cane & assistance as needed    Diet: regular  Disposition Home

## 2022-09-17 NOTE — Care Management Obs Status (Signed)
[place] NOTIFICATION   Patient Details  Name: [name] [name] MRN: [id number] Date of Birth: 27-Feb-1941   Medicare Observation Status Notification Given:  Yes    Tom-Johnson, Renea Ee, RN 09/17/2022, 9:47 AM

## 2022-09-21 LAB — AEROBIC/ANAEROBIC CULTURE W GRAM STAIN (SURGICAL/DEEP WOUND): Culture: NO GROWTH

## 2022-09-23 DIAGNOSIS — I69354 Hemiplegia and hemiparesis following cerebral infarction affecting left non-dominant side: Secondary | ICD-10-CM | POA: Diagnosis not present

## 2022-09-23 DIAGNOSIS — H6122 Impacted cerumen, left ear: Secondary | ICD-10-CM | POA: Diagnosis not present

## 2022-09-23 DIAGNOSIS — I10 Essential (primary) hypertension: Secondary | ICD-10-CM | POA: Diagnosis not present

## 2022-09-23 DIAGNOSIS — H9312 Tinnitus, left ear: Secondary | ICD-10-CM | POA: Diagnosis not present

## 2022-09-23 DIAGNOSIS — G40309 Generalized idiopathic epilepsy and epileptic syndromes, not intractable, without status epilepticus: Secondary | ICD-10-CM | POA: Diagnosis not present

## 2022-09-23 DIAGNOSIS — I739 Peripheral vascular disease, unspecified: Secondary | ICD-10-CM | POA: Diagnosis not present

## 2022-09-23 DIAGNOSIS — T148XXA Other injury of unspecified body region, initial encounter: Secondary | ICD-10-CM | POA: Diagnosis not present

## 2022-09-25 ENCOUNTER — Other Ambulatory Visit: Payer: Self-pay | Admitting: Adult Health

## 2022-09-28 ENCOUNTER — Other Ambulatory Visit: Payer: Self-pay | Admitting: *Deleted

## 2022-09-28 MED ORDER — LAMOTRIGINE 100 MG PO TABS: 100.0000 mg | ORAL_TABLET | Freq: Two times a day (BID) | ORAL | 11 refills | Status: DC

## 2022-10-07 LAB — CULTURE, FUNGUS WITHOUT SMEAR

## 2022-10-13 ENCOUNTER — Telehealth (INDEPENDENT_AMBULATORY_CARE_PROVIDER_SITE_OTHER): Payer: Medicare PPO | Admitting: Adult Health

## 2022-10-13 DIAGNOSIS — G40909 Epilepsy, unspecified, not intractable, without status epilepticus: Secondary | ICD-10-CM | POA: Diagnosis not present

## 2022-10-13 NOTE — Progress Notes (Signed)
PATIENT: [name] [name] DOB: 10-08-1940  REASON FOR VISIT: follow up HISTORY FROM: patient  Virtual Visit via Video Note  I connected with [name] [name] on 10/13/22 at  3:15 PM EST by a video enabled telemedicine application located remotely at [facility] and verified that I am speaking with the correct person using two identifiers who was located at their own home.   I discussed the limitations of evaluation and management by telemedicine and the availability of in person appointments. The patient expressed understanding and agreed to proceed.   PATIENT: [name] [name] DOB: 1941-08-15  REASON FOR VISIT: follow up HISTORY FROM: patient  HISTORY OF PRESENT ILLNESS: Today 10/13/22: [name] [name] is a 82 y.o. female with a history of seizures. Returns today for follow-up.  Reports that she continues on Lamictal 100 mg twice a day.  Denies any seizure-like episodes.  Has history of stroke.  Denies any strokelike symptoms.  Her primary care continues to manage risk factors.  She joins me today for virtual visit      2/2/23L [name] [name] is a 82 year old female with a history of stroke and seizures.  She returns today for follow-up.  She denies any seizure events.  She continues on Lamictal 100 mg twice a day.  She denies any new symptoms.  Her PCP is managing stroke risk factors.  She returns today for evaluation.  HISTORY 07/25/19:   [name] [name] is a 82 year old female with a history of stroke in December 2010 with left hemiparesis and history of seizures.  She returns today for follow-up.  She denies any strokelike symptoms.  She reports her blood pressure has been elevated and her PCP recently increase metoprolol to 100 mg.  She remains on Plavix and aspirin.  She denies any seizure events.  She continues on Lamictal 100 mg twice a day.  She lives at home with her daughter.  She is able to complete most ADLs independently her daughter does have to help her get dressed.  She  returns today for an evaluation.    REVIEW OF SYSTEMS: Out of a complete 14 system review of symptoms, the patient complains only of the following symptoms, and all other reviewed systems are negative.  ALLERGIES: Allergies  Allergen Reactions   Amlodipine Other (See Comments)    Two days into therapy, hallucinations started. Stopped when med was no longer taken.   Latex Rash   Penicillins Rash    Has patient had a PCN reaction causing immediate rash, facial/tongue/throat swelling, SOB or lightheadedness with hypotension: No Has patient had a PCN reaction causing severe rash involving mucus membranes or skin necrosis: No Has patient had a PCN reaction that required hospitalization No Has patient had a PCN reaction occurring within the last 10 years: No If all of the above answers are "NO", then may proceed with Cephalosporin use.   Tape Rash    HOME MEDICATIONS: Outpatient Medications Prior to Visit  Medication Sig Dispense Refill   acetaminophen (TYLENOL) 500 MG tablet Take 1-2 tablets (500-1,000 mg total) by mouth every 8 (eight) hours as needed for mild pain. 90 tablet 0   aspirin EC 81 MG tablet Take 1 tablet (81 mg total) by mouth daily. With Breakfast (Patient not taking: Reported on 09/17/2022) 120 tablet 3   atorvastatin (LIPITOR) 20 MG tablet Take 1 tablet (20 mg total) by mouth every evening. (Patient not taking: Reported on 09/17/2022) 30 tablet 5   atorvastatin (LIPITOR) 40 MG  tablet Take 40 mg by mouth daily.     BAYER ASPIRIN EC LOW DOSE 81 MG tablet Take 81 mg by mouth daily. Swallow whole.     chlorthalidone (HYGROTON) 25 MG tablet Take 25 mg by mouth daily.     cholecalciferol (VITAMIN D) 1000 units tablet Take 1 tablet (1,000 Units total) by mouth daily. (Patient not taking: Reported on 09/17/2022) 30 tablet 5   Cholecalciferol (VITAMIN D3) 50 MCG (2000 UT) TABS Take 2,000 Units by mouth daily.     clopidogrel (PLAVIX) 75 MG tablet Take 1 tablet (75 mg total) by mouth  daily. 30 tablet 5   clopidogrel (PLAVIX) 75 MG tablet Take 75 mg by mouth in the morning.     lamoTRIgine (LAMICTAL) 100 MG tablet Take 1 tablet (100 mg total) by mouth 2 (two) times daily. 60 tablet 11   lisinopril (PRINIVIL,ZESTRIL) 20 MG tablet Take 1 tablet (20 mg total) by mouth at bedtime. (Patient not taking: Reported on 09/17/2022) 30 tablet 5   lisinopril (ZESTRIL) 40 MG tablet Take 40 mg by mouth daily.     metoprolol succinate (TOPROL-XL) 100 MG 24 hr tablet Take 100 mg by mouth in the morning. Take with or immediately following a meal.     metoprolol succinate (TOPROL-XL) 50 MG 24 hr tablet Take 1 tablet (50 mg total) by mouth daily. Take with or immediately following a meal. (Patient not taking: Reported on 09/17/2022) 30 tablet 5   nitroGLYCERIN (NITROSTAT) 0.4 MG SL tablet DISSOLVE 1 TABLET UNDER THE TONGUE EVERY 5 MINUTES AS NEEDED FOR CHEST PAIN OR SHORTNESS OF BREATH (Patient taking differently: Place 0.4 mg under the tongue every 5 (five) minutes as needed for chest pain (or shortness of breath).) 75 tablet 0   oxyCODONE (OXY IR/ROXICODONE) 5 MG immediate release tablet Take 1 tablet (5 mg total) by mouth every 6 (six) hours as needed for moderate pain or breakthrough pain. 20 tablet 0   polyethylene glycol (MIRALAX / GLYCOLAX) packet Take 17 g by mouth daily. (Patient taking differently: Take 17 g by mouth daily as needed for mild constipation.) 30 each 5   senna-docusate (SENOKOT-S) 8.6-50 MG tablet Take 2 tablets by mouth at bedtime. (Patient not taking: Reported on 09/17/2022) 60 tablet 5   No facility-administered medications prior to visit.    PAST MEDICAL HISTORY: Past Medical History:  Diagnosis Date   CAD (coronary artery disease) 1997    with CABG   Cervical spine fracture [facility])    Not requiring surgery   Chest pain    Constipation    Foot drop, left    Gait disturbance    quad cane   Headache(784.0) 03/14/2013   Heart attack ([place])    Hemiparesis and alteration  of sensations as late effects of stroke ([place]) 03/14/2013   left   Hypercholesterolemia    Hypertension    Osteoporosis    Palpitations    Periorbital hematoma of left eye    Peripheral vascular disease (HCC)    Left superficial femoral artery   Peroneal DVT (deep venous thrombosis) (HCC)    Post-menopausal    PVD (peripheral vascular disease) ([place])    Seizures ([place])    Stroke ([place])    Stroke ([place]) 2010   Urinary incontinence     PAST SURGICAL HISTORY: Past Surgical History:  Procedure Laterality Date   CATARACT EXTRACTION     CORONARY ARTERY BYPASS GRAFT     x3   IR US GUIDE BX ASP/DRAIN  09/16/2022    FAMILY HISTORY: Family History  Problem Relation Age of Onset   Heart attack Mother    Heart attack Father    Heart attack Sister    Heart attack Brother     SOCIAL HISTORY: Social History   Socioeconomic History   Marital status: Widowed    Spouse name: Not on file   Number of children: 5   Years of education: 10   Highest education level: Not on file  Occupational History   Occupation: Retired  Tobacco Use   Smoking status: Never   Smokeless tobacco: Never  Vaping Use   Vaping Use: Never used  Substance and Sexual Activity   Alcohol use: No   Drug use: No   Sexual activity: Not on file  Other Topics Concern   Not on file  Social History Narrative   Patient is a widowed and lives with her daughter Judeen Hammans).   Patient has three living children and two are deceased.   Patient is retired.   Patient has a 10th grade education.   Patient is right-handed.   Patient does not drink caffeine.      Social Determinants of Health   Financial Resource Strain: Not on file  Food Insecurity: No Food Insecurity (09/16/2022)   Hunger Vital Sign    Worried About Running Out of Food in the Last Year: Never true    Ran Out of Food in the Last Year: Never true  Transportation Needs: No Transportation Needs (09/16/2022)   PRAPARE - Radiographer, therapeutic (Medical): No    Lack of Transportation (Non-Medical): No  Physical Activity: Not on file  Stress: Not on file  Social Connections: Not on file  Intimate Partner Violence: Not At Risk (09/16/2022)   Humiliation, Afraid, Rape, and Kick questionnaire    Fear of Current or Ex-Partner: No    Emotionally Abused: No    Physically Abused: No    Sexually Abused: No      PHYSICAL EXAM Generalized: Well developed, in no acute distress   Neurological examination  Mentation: Alert oriented to time, place, history taking. Follows all commands speech and language fluent Cranial nerve II-XII:Extraocular movements were full.  Head turning and shoulder shrug  were normal and symmetric.   DIAGNOSTIC DATA (LABS, IMAGING, TESTING) - I reviewed patient records, labs, notes, testing and imaging myself where available.  Lab Results  Component Value Date   WBC 4.5 09/17/2022   HGB 12.1 09/17/2022   HCT 37.2 09/17/2022   MCV 89.4 09/17/2022   PLT 150 09/17/2022      Component Value Date/Time   NA 136 09/17/2022 0649   NA 141 12/09/2015 0000   K 3.7 09/17/2022 0649   CL 102 09/17/2022 0649   CO2 24 09/17/2022 0649   GLUCOSE 88 09/17/2022 0649   BUN 7 (L) 09/17/2022 0649   BUN 14 12/09/2015 0000   CREATININE 0.90 09/17/2022 0649   CALCIUM 9.1 09/17/2022 0649   PROT 6.8 09/17/2022 0649   ALBUMIN 3.3 (L) 09/17/2022 0649   AST 21 09/17/2022 0649   ALT 15 09/17/2022 0649   ALKPHOS 76 09/17/2022 0649   BILITOT 0.6 09/17/2022 0649   GFRNONAA >60 09/17/2022 0649   GFRAA >60 10/06/2018 0240   Lab Results  Component Value Date   CHOL 153 12/02/2015   HDL 41 12/02/2015   LDLCALC 101 (H) 12/02/2015   TRIG 57 12/02/2015   CHOLHDL 3.7 12/02/2015   No results found  for: "HGBA1C" No results found for: "VITAMINB12" Lab Results  Component Value Date   TSH 1.98 12/09/2015      ASSESSMENT AND PLAN 82 y.o. year old female  has a past medical history of CAD (coronary artery  disease) (1997 ), Cervical spine fracture ([place]), Chest pain, Constipation, Foot drop, left, Gait disturbance, Headache(784.0) (03/14/2013), Heart attack ([place]), Hemiparesis and alteration of sensations as late effects of stroke ([place]) (03/14/2013), Hypercholesterolemia, Hypertension, Osteoporosis, Palpitations, Periorbital hematoma of left eye, Peripheral vascular disease ([place]), Peroneal DVT (deep venous thrombosis) ([place]), Post-menopausal, PVD (peripheral vascular disease) ([place]), Seizures ([place]), Stroke ([place]), Stroke ([place]) (2010), and Urinary incontinence. here with:  1.  Seizures  Continue Lamictal 100 mg twice a day Advise if she has any seizure events to let us know Follow-up in 1 year or sooner if needed     Ward Givens, MSN, NP-C 10/13/2022, 3:11 PM [facility] [address], [place], [place] [address] [phone]

## 2023-01-18 DIAGNOSIS — E441 Mild protein-calorie malnutrition: Secondary | ICD-10-CM | POA: Diagnosis not present

## 2023-01-18 DIAGNOSIS — I25118 Atherosclerotic heart disease of native coronary artery with other forms of angina pectoris: Secondary | ICD-10-CM | POA: Diagnosis not present

## 2023-01-18 DIAGNOSIS — I69354 Hemiplegia and hemiparesis following cerebral infarction affecting left non-dominant side: Secondary | ICD-10-CM | POA: Diagnosis not present

## 2023-01-18 DIAGNOSIS — I739 Peripheral vascular disease, unspecified: Secondary | ICD-10-CM | POA: Diagnosis not present

## 2023-01-18 DIAGNOSIS — I10 Essential (primary) hypertension: Secondary | ICD-10-CM | POA: Diagnosis not present

## 2023-01-18 DIAGNOSIS — E785 Hyperlipidemia, unspecified: Secondary | ICD-10-CM | POA: Diagnosis not present

## 2023-07-16 DIAGNOSIS — I10 Essential (primary) hypertension: Secondary | ICD-10-CM | POA: Diagnosis not present

## 2023-07-16 DIAGNOSIS — I129 Hypertensive chronic kidney disease with stage 1 through stage 4 chronic kidney disease, or unspecified chronic kidney disease: Secondary | ICD-10-CM | POA: Diagnosis not present

## 2023-07-16 DIAGNOSIS — I25118 Atherosclerotic heart disease of native coronary artery with other forms of angina pectoris: Secondary | ICD-10-CM | POA: Diagnosis not present

## 2023-07-16 DIAGNOSIS — I739 Peripheral vascular disease, unspecified: Secondary | ICD-10-CM | POA: Diagnosis not present

## 2023-07-16 DIAGNOSIS — N1831 Chronic kidney disease, stage 3a: Secondary | ICD-10-CM | POA: Diagnosis not present

## 2023-07-16 DIAGNOSIS — G40309 Generalized idiopathic epilepsy and epileptic syndromes, not intractable, without status epilepticus: Secondary | ICD-10-CM | POA: Diagnosis not present

## 2023-07-16 DIAGNOSIS — E785 Hyperlipidemia, unspecified: Secondary | ICD-10-CM | POA: Diagnosis not present

## 2023-07-16 DIAGNOSIS — Z0000 Encounter for general adult medical examination without abnormal findings: Secondary | ICD-10-CM | POA: Diagnosis not present

## 2023-07-16 DIAGNOSIS — Z1331 Encounter for screening for depression: Secondary | ICD-10-CM | POA: Diagnosis not present

## 2023-07-16 DIAGNOSIS — I69354 Hemiplegia and hemiparesis following cerebral infarction affecting left non-dominant side: Secondary | ICD-10-CM | POA: Diagnosis not present

## 2023-07-20 ENCOUNTER — Other Ambulatory Visit: Payer: Self-pay | Admitting: Family Medicine

## 2023-07-20 DIAGNOSIS — M810 Age-related osteoporosis without current pathological fracture: Secondary | ICD-10-CM

## 2023-09-29 ENCOUNTER — Other Ambulatory Visit: Payer: Self-pay | Admitting: Adult Health

## 2023-09-29 NOTE — Telephone Encounter (Signed)
Upcoming appt 10/19/23, 1 refill given.

## 2023-10-19 ENCOUNTER — Telehealth: Payer: Medicare PPO | Admitting: Adult Health

## 2023-10-19 DIAGNOSIS — G40909 Epilepsy, unspecified, not intractable, without status epilepticus: Secondary | ICD-10-CM

## 2023-10-19 DIAGNOSIS — R569 Unspecified convulsions: Secondary | ICD-10-CM

## 2023-10-19 DIAGNOSIS — Z5181 Encounter for therapeutic drug level monitoring: Secondary | ICD-10-CM

## 2023-10-19 NOTE — Progress Notes (Signed)
 PATIENT: [name] [name] DOB: 01/04/1941  REASON FOR VISIT: follow up HISTORY FROM: patient  Virtual Visit via Video Note  I connected with Sevin Farone Massingale on 10/19/23 at  3:15 PM EST by a video enabled telemedicine application located remotely at [facility] and verified that I am speaking with the correct person using two identifiers who was located at their own home in [place].    I discussed the limitations of evaluation and management by telemedicine and the availability of in person appointments. The patient expressed understanding and agreed to proceed.   PATIENT: [name] [name] DOB: 09-26-1940  REASON FOR VISIT: follow up HISTORY FROM: patient  HISTORY OF PRESENT ILLNESS: Today 10/19/23:  [name] [name] is a 83 y.o. female with a history of seizures. Returns today for follow-up. No seizures.  Remains on Lamictal 100 mg twice a day.  Denies any changes with her gait or balance.  She will see her PCP in 6 months.  She joins me today for virtual visit    10/13/22: [name] is a 83 y.o. female with a history of seizures. Returns today for follow-up.  Reports that she continues on Lamictal 100 mg twice a day.  Denies any seizure-like episodes.  Has history of stroke.  Denies any strokelike symptoms.  Her primary care continues to manage risk factors.  She joins me today for virtual visit   2/2/23L [name] is a 83 year old female with a history of stroke and seizures.  She returns today for follow-up.  She denies any seizure events.  She continues on Lamictal 100 mg twice a day.  She denies any new symptoms.  Her PCP is managing stroke risk factors.  She returns today for evaluation.  HISTORY 07/25/19:   [name] is a 84 year old female with a history of stroke in December 2010 with left hemiparesis and history of seizures.  She returns today for follow-up.  She denies any strokelike symptoms.  She reports her blood pressure has been elevated and her PCP recently  increase metoprolol to 100 mg.  She remains on Plavix and aspirin.  She denies any seizure events.  She continues on Lamictal 100 mg twice a day.  She lives at home with her daughter.  She is able to complete most ADLs independently her daughter does have to help her get dressed.  She returns today for an evaluation.    REVIEW OF SYSTEMS: Out of a complete 14 system review of symptoms, the patient complains only of the following symptoms, and all other reviewed systems are negative.  ALLERGIES: Allergies  Allergen Reactions   Amlodipine Other (See Comments)    Two days into therapy, hallucinations started. Stopped when med was no longer taken.   Latex Rash   Penicillins Rash    Has patient had a PCN reaction causing immediate rash, facial/tongue/throat swelling, SOB or lightheadedness with hypotension: No Has patient had a PCN reaction causing severe rash involving mucus membranes or skin necrosis: No Has patient had a PCN reaction that required hospitalization No Has patient had a PCN reaction occurring within the last 10 years: No If all of the above answers are "NO", then may proceed with Cephalosporin use.   Tape Rash    HOME MEDICATIONS: Outpatient Medications Prior to Visit  Medication Sig Dispense Refill   acetaminophen (TYLENOL) 500 MG tablet Take 1-2 tablets (500-1,000 mg total) by mouth every 8 (eight) hours as needed for mild pain. 90 tablet 0  aspirin EC 81 MG tablet Take 1 tablet (81 mg total) by mouth daily. With Breakfast (Patient not taking: Reported on 09/17/2022) 120 tablet 3   atorvastatin (LIPITOR) 20 MG tablet Take 1 tablet (20 mg total) by mouth every evening. (Patient not taking: Reported on 09/17/2022) 30 tablet 5   atorvastatin (LIPITOR) 40 MG tablet Take 40 mg by mouth daily.     BAYER ASPIRIN EC LOW DOSE 81 MG tablet Take 81 mg by mouth daily. Swallow whole.     chlorthalidone (HYGROTON) 25 MG tablet Take 25 mg by mouth daily.     cholecalciferol (VITAMIN D)  1000 units tablet Take 1 tablet (1,000 Units total) by mouth daily. (Patient not taking: Reported on 09/17/2022) 30 tablet 5   Cholecalciferol (VITAMIN D3) 50 MCG (2000 UT) TABS Take 2,000 Units by mouth daily.     clopidogrel (PLAVIX) 75 MG tablet Take 1 tablet (75 mg total) by mouth daily. 30 tablet 5   clopidogrel (PLAVIX) 75 MG tablet Take 75 mg by mouth in the morning.     lamoTRIgine (LAMICTAL) 100 MG tablet TAKE 1 TABLET(100 MG) BY MOUTH TWICE DAILY 60 tablet 0   lisinopril (PRINIVIL,ZESTRIL) 20 MG tablet Take 1 tablet (20 mg total) by mouth at bedtime. (Patient not taking: Reported on 09/17/2022) 30 tablet 5   lisinopril (ZESTRIL) 40 MG tablet Take 40 mg by mouth daily.     metoprolol succinate (TOPROL-XL) 100 MG 24 hr tablet Take 100 mg by mouth in the morning. Take with or immediately following a meal.     metoprolol succinate (TOPROL-XL) 50 MG 24 hr tablet Take 1 tablet (50 mg total) by mouth daily. Take with or immediately following a meal. (Patient not taking: Reported on 09/17/2022) 30 tablet 5   nitroGLYCERIN (NITROSTAT) 0.4 MG SL tablet DISSOLVE 1 TABLET UNDER THE TONGUE EVERY 5 MINUTES AS NEEDED FOR CHEST PAIN OR SHORTNESS OF BREATH (Patient taking differently: Place 0.4 mg under the tongue every 5 (five) minutes as needed for chest pain (or shortness of breath).) 75 tablet 0   oxyCODONE (OXY IR/ROXICODONE) 5 MG immediate release tablet Take 1 tablet (5 mg total) by mouth every 6 (six) hours as needed for moderate pain or breakthrough pain. 20 tablet 0   polyethylene glycol (MIRALAX / GLYCOLAX) packet Take 17 g by mouth daily. (Patient taking differently: Take 17 g by mouth daily as needed for mild constipation.) 30 each 5   senna-docusate (SENOKOT-S) 8.6-50 MG tablet Take 2 tablets by mouth at bedtime. (Patient not taking: Reported on 09/17/2022) 60 tablet 5   No facility-administered medications prior to visit.    PAST MEDICAL HISTORY: Past Medical History:  Diagnosis Date   CAD  (coronary artery disease) 1997    with CABG   Cervical spine fracture [facility])    Not requiring surgery   Chest pain    Constipation    Foot drop, left    Gait disturbance    quad cane   Headache(784.0) 03/14/2013   Heart attack (HCC)    Hemiparesis and alteration of sensations as late effects of stroke (HCC) 03/14/2013   left   Hypercholesterolemia    Hypertension    Osteoporosis    Palpitations    Periorbital hematoma of left eye    Peripheral vascular disease (HCC)    Left superficial femoral artery   Peroneal DVT (deep venous thrombosis) (HCC)    Post-menopausal    PVD (peripheral vascular disease) (HCC)    Seizures (HCC)  Stroke [facility])    Stroke [facility]) 2010   Urinary incontinence     PAST SURGICAL HISTORY: Past Surgical History:  Procedure Laterality Date   CATARACT EXTRACTION     CORONARY ARTERY BYPASS GRAFT     x3   IR US GUIDE BX ASP/DRAIN  09/16/2022    FAMILY HISTORY: Family History  Problem Relation Age of Onset   Heart attack Mother    Heart attack Father    Heart attack Sister    Heart attack Brother     SOCIAL HISTORY: Social History   Socioeconomic History   Marital status: Widowed    Spouse name: Not on file   Number of children: 5   Years of education: 10   Highest education level: Not on file  Occupational History   Occupation: Retired  Tobacco Use   Smoking status: Never   Smokeless tobacco: Never  Vaping Use   Vaping status: Never Used  Substance and Sexual Activity   Alcohol use: No   Drug use: No   Sexual activity: Not on file  Other Topics Concern   Not on file  Social History Narrative   Patient is a widowed and lives with her daughter [name].   Patient has three living children and two are deceased.   Patient is retired.   Patient has a 10th grade education.   Patient is right-handed.   Patient does not drink caffeine.      Social Drivers of Corporate investment banker Strain: Not on file  Food Insecurity: No Food  Insecurity (09/16/2022)   Hunger Vital Sign    Worried About Running Out of Food in the Last Year: Never true    Ran Out of Food in the Last Year: Never true  Transportation Needs: No Transportation Needs (09/16/2022)   PRAPARE - Administrator, Civil Service (Medical): No    Lack of Transportation (Non-Medical): No  Physical Activity: Not on file  Stress: Not on file  Social Connections: Not on file  Intimate Partner Violence: Not At Risk (09/16/2022)   Humiliation, Afraid, Rape, and Kick questionnaire    Fear of Current or Ex-Partner: No    Emotionally Abused: No    Physically Abused: No    Sexually Abused: No      PHYSICAL EXAM Generalized: Well developed, in no acute distress   Neurological examination  Mentation: Alert oriented to time, place, history taking. Follows all commands speech and language fluent Cranial nerve II-XII:Extraocular movements were full.  Head turning and shoulder shrug  were normal and symmetric.   DIAGNOSTIC DATA (LABS, IMAGING, TESTING) - I reviewed patient records, labs, notes, testing and imaging myself where available.  Lab Results  Component Value Date   WBC 4.5 09/17/2022   HGB 12.1 09/17/2022   HCT 37.2 09/17/2022   MCV 89.4 09/17/2022   PLT 150 09/17/2022      Component Value Date/Time   NA 136 09/17/2022 0649   NA 141 12/09/2015 0000   K 3.7 09/17/2022 0649   CL 102 09/17/2022 0649   CO2 24 09/17/2022 0649   GLUCOSE 88 09/17/2022 0649   BUN 7 (L) 09/17/2022 0649   BUN 14 12/09/2015 0000   CREATININE 0.90 09/17/2022 0649   CALCIUM 9.1 09/17/2022 0649   PROT 6.8 09/17/2022 0649   ALBUMIN 3.3 (L) 09/17/2022 0649   AST 21 09/17/2022 0649   ALT 15 09/17/2022 0649   ALKPHOS 76 09/17/2022 0649   BILITOT 0.6 09/17/2022 6295  GFRNONAA >60 09/17/2022 0649   GFRAA >60 10/06/2018 0240   Lab Results  Component Value Date   CHOL 153 12/02/2015   HDL 41 12/02/2015   LDLCALC 101 (H) 12/02/2015   TRIG 57 12/02/2015    CHOLHDL 3.7 12/02/2015   No results found for: "HGBA1C" No results found for: "VITAMINB12" Lab Results  Component Value Date   TSH 1.98 12/09/2015      ASSESSMENT AND PLAN 83 y.o. year old female  has a past medical history of CAD (coronary artery disease) (1997 ), Cervical spine fracture (HCC), Chest pain, Constipation, Foot drop, left, Gait disturbance, Headache(784.0) (03/14/2013), Heart attack (HCC), Hemiparesis and alteration of sensations as late effects of stroke (HCC) (03/14/2013), Hypercholesterolemia, Hypertension, Osteoporosis, Palpitations, Periorbital hematoma of left eye, Peripheral vascular disease (HCC), Peroneal DVT (deep venous thrombosis) (HCC), Post-menopausal, PVD (peripheral vascular disease) (HCC), Seizures (HCC), Stroke (HCC), Stroke (HCC) (2010), and Urinary incontinence. here with:  1.  Seizures  Continue Lamictal 100 mg twice a day Blood work has been ordered-lamotrigine, CBC and CMP Advise if she has any seizure events to let us know Follow-up in 1 year or sooner if needed     Butch Penny, MSN, NP-C 10/19/2023, 3:14 PM [facility] [address] [place], [place] [address] [phone]

## 2023-10-31 ENCOUNTER — Other Ambulatory Visit: Payer: Self-pay | Admitting: Adult Health

## 2024-01-18 DIAGNOSIS — E785 Hyperlipidemia, unspecified: Secondary | ICD-10-CM | POA: Diagnosis not present

## 2024-01-18 DIAGNOSIS — I739 Peripheral vascular disease, unspecified: Secondary | ICD-10-CM | POA: Diagnosis not present

## 2024-01-18 DIAGNOSIS — K5909 Other constipation: Secondary | ICD-10-CM | POA: Diagnosis not present

## 2024-01-18 DIAGNOSIS — G40309 Generalized idiopathic epilepsy and epileptic syndromes, not intractable, without status epilepticus: Secondary | ICD-10-CM | POA: Diagnosis not present

## 2024-01-18 DIAGNOSIS — I129 Hypertensive chronic kidney disease with stage 1 through stage 4 chronic kidney disease, or unspecified chronic kidney disease: Secondary | ICD-10-CM | POA: Diagnosis not present

## 2024-01-18 DIAGNOSIS — I25118 Atherosclerotic heart disease of native coronary artery with other forms of angina pectoris: Secondary | ICD-10-CM | POA: Diagnosis not present

## 2024-01-18 DIAGNOSIS — I10 Essential (primary) hypertension: Secondary | ICD-10-CM | POA: Diagnosis not present

## 2024-01-18 DIAGNOSIS — I69354 Hemiplegia and hemiparesis following cerebral infarction affecting left non-dominant side: Secondary | ICD-10-CM | POA: Diagnosis not present

## 2024-01-18 DIAGNOSIS — N1831 Chronic kidney disease, stage 3a: Secondary | ICD-10-CM | POA: Diagnosis not present

## 2024-03-10 ENCOUNTER — Other Ambulatory Visit: Payer: Medicare PPO

## 2024-09-05 ENCOUNTER — Telehealth: Admitting: Adult Health

## 2024-09-22 ENCOUNTER — Telehealth: Admitting: Adult Health

## 2024-09-22 DIAGNOSIS — G40909 Epilepsy, unspecified, not intractable, without status epilepticus: Secondary | ICD-10-CM

## 2024-09-22 DIAGNOSIS — R569 Unspecified convulsions: Secondary | ICD-10-CM | POA: Diagnosis not present

## 2024-09-22 NOTE — Patient Instructions (Signed)
Your Plan:  Continue Lamictal 100 mg twice a day Blood work today If your symptoms worsen or you develop new symptoms please let us know.       Thank you for coming to see us at [facility]. I hope we have been able to provide you high quality care today.  You may receive a patient satisfaction survey over the next few weeks. We would appreciate your feedback and comments so that we may continue to improve ourselves and the health of our patients.  

## 2024-09-22 NOTE — Progress Notes (Signed)
 "    PATIENT: [name] [name] DOB: [birth year]  REASON FOR VISIT: follow up HISTORY FROM: patient  Virtual Visit via Video Note  I connected with [name] [name] on 09/22/24 at  9:00 AM EST by a video enabled telemedicine application located remotely at [facility] and verified that I am speaking with the correct person using two identifiers who was located at their own home in [place].    I discussed the limitations of evaluation and management by telemedicine and the availability of in person appointments. The patient expressed understanding and agreed to proceed.   PATIENT: [name] [name] DOB: 1941/03/02  REASON FOR VISIT: follow up HISTORY FROM: patient  HISTORY OF PRESENT ILLNESS: Today 09/22/24:  [name] [name] is a 84 y.o. female with a history of seizures. Returns today for follow-up.  She denies any seizure events.  She remains on Lamictal  100 mg twice a day.  Reports that she is tolerate the medication well.  They did forget to come get blood work after the last visit.  She does see her PCP regularly.  Denies any changes with her gait or balance.  No change in mood or behavior.  Denies any trouble with her memory.  Returns today for an evaluation.     10/19/23: [name] [name] is a 84 y.o. female with a history of seizures. Returns today for follow-up. No seizures.  Remains on Lamictal  100 mg twice a day.  Denies any changes with her gait or balance.  She will see her PCP in 6 months.  She joins me today for virtual visit    10/13/22: [name] [name] is a 84 y.o. female with a history of seizures. Returns today for follow-up.  Reports that she continues on Lamictal  100 mg twice a day.  Denies any seizure-like episodes.  Has history of stroke.  Denies any strokelike symptoms.  Her primary care continues to manage risk factors.  She joins me today for virtual visit   2/2/23L Ms. Deshotels is a 84 year old female with a history of stroke and seizures.  She returns today for  follow-up.  She denies any seizure events.  She continues on Lamictal  100 mg twice a day.  She denies any new symptoms.  Her PCP is managing stroke risk factors.  She returns today for evaluation.  HISTORY 07/25/19:   Ms. Wire is a 84 year old female with a history of stroke in December 2010 with left hemiparesis and history of seizures.  She returns today for follow-up.  She denies any strokelike symptoms.  She reports her blood pressure has been elevated and her PCP recently increase metoprolol  to 100 mg.  She remains on Plavix  and aspirin .  She denies any seizure events.  She continues on Lamictal  100 mg twice a day.  She lives at home with her daughter.  She is able to complete most ADLs independently her daughter does have to help her get dressed.  She returns today for an evaluation.    REVIEW OF SYSTEMS: Out of a complete 14 system review of symptoms, the patient complains only of the following symptoms, and all other reviewed systems are negative.  ALLERGIES: Allergies  Allergen Reactions   Amlodipine Other (See Comments)    Two days into therapy, hallucinations started. Stopped when med was no longer taken.   Latex Rash   Penicillins Rash    Has patient had a PCN reaction causing immediate rash, facial/tongue/throat swelling, SOB or lightheadedness with hypotension: No Has patient  had a PCN reaction causing severe rash involving mucus membranes or skin necrosis: No Has patient had a PCN reaction that required hospitalization No Has patient had a PCN reaction occurring within the last 10 years: No If all of the above answers are NO, then may proceed with Cephalosporin use.   Tape Rash    HOME MEDICATIONS: Outpatient Medications Prior to Visit  Medication Sig Dispense Refill   acetaminophen  (TYLENOL ) 500 MG tablet Take 1-2 tablets (500-1,000 mg total) by mouth every 8 (eight) hours as needed for mild pain. 90 tablet 0   aspirin  EC 81 MG tablet Take 1 tablet (81 mg total) by mouth  daily. With Breakfast (Patient not taking: Reported on 09/17/2022) 120 tablet 3   atorvastatin  (LIPITOR) 20 MG tablet Take 1 tablet (20 mg total) by mouth every evening. (Patient not taking: Reported on 09/17/2022) 30 tablet 5   atorvastatin  (LIPITOR) 40 MG tablet Take 40 mg by mouth daily.     BAYER ASPIRIN  EC LOW DOSE 81 MG tablet Take 81 mg by mouth daily. Swallow whole.     chlorthalidone (HYGROTON) 25 MG tablet Take 25 mg by mouth daily.     cholecalciferol  (VITAMIN D ) 1000 units tablet Take 1 tablet (1,000 Units total) by mouth daily. (Patient not taking: Reported on 09/17/2022) 30 tablet 5   Cholecalciferol  (VITAMIN D3) 50 MCG (2000 UT) TABS Take 2,000 Units by mouth daily.     clopidogrel  (PLAVIX ) 75 MG tablet Take 1 tablet (75 mg total) by mouth daily. 30 tablet 5   clopidogrel  (PLAVIX ) 75 MG tablet Take 75 mg by mouth in the morning.     lamoTRIgine  (LAMICTAL ) 100 MG tablet TAKE 1 TABLET(100 MG) BY MOUTH TWICE DAILY 60 tablet 11   lisinopril  (PRINIVIL ,ZESTRIL ) 20 MG tablet Take 1 tablet (20 mg total) by mouth at bedtime. (Patient not taking: Reported on 09/17/2022) 30 tablet 5   lisinopril  (ZESTRIL ) 40 MG tablet Take 40 mg by mouth daily.     metoprolol  succinate (TOPROL -XL) 100 MG 24 hr tablet Take 100 mg by mouth in the morning. Take with or immediately following a meal.     metoprolol  succinate (TOPROL -XL) 50 MG 24 hr tablet Take 1 tablet (50 mg total) by mouth daily. Take with or immediately following a meal. (Patient not taking: Reported on 09/17/2022) 30 tablet 5   nitroGLYCERIN  (NITROSTAT ) 0.4 MG SL tablet DISSOLVE 1 TABLET UNDER THE TONGUE EVERY 5 MINUTES AS NEEDED FOR CHEST PAIN OR SHORTNESS OF BREATH (Patient taking differently: Place 0.4 mg under the tongue every 5 (five) minutes as needed for chest pain (or shortness of breath).) 75 tablet 0   oxyCODONE  (OXY IR/ROXICODONE ) 5 MG immediate release tablet Take 1 tablet (5 mg total) by mouth every 6 (six) hours as needed for moderate pain  or breakthrough pain. 20 tablet 0   polyethylene glycol (MIRALAX  / GLYCOLAX ) packet Take 17 g by mouth daily. (Patient taking differently: Take 17 g by mouth daily as needed for mild constipation.) 30 each 5   senna-docusate (SENOKOT-S) 8.6-50 MG tablet Take 2 tablets by mouth at bedtime. (Patient not taking: Reported on 09/17/2022) 60 tablet 5   No facility-administered medications prior to visit.    PAST MEDICAL HISTORY: Past Medical History:  Diagnosis Date   CAD (coronary artery disease) 1997    with CABG   Cervical spine fracture [facility])    Not requiring surgery   Chest pain    Constipation    Foot drop, left  Gait disturbance    quad cane   Headache(784.0) 03/14/2013   Heart attack (HCC)    Hemiparesis and alteration of sensations as late effects of stroke (HCC) 03/14/2013   left   Hypercholesterolemia    Hypertension    Osteoporosis    Palpitations    Periorbital hematoma of left eye    Peripheral vascular disease    Left superficial femoral artery   Peroneal DVT (deep venous thrombosis) (HCC)    Post-menopausal    PVD (peripheral vascular disease)    Seizures (HCC)    Stroke (HCC)    Stroke (HCC) 2010   Urinary incontinence     PAST SURGICAL HISTORY: Past Surgical History:  Procedure Laterality Date   CATARACT EXTRACTION     CORONARY ARTERY BYPASS GRAFT     x3   IR US  GUIDE BX ASP/DRAIN  09/16/2022    FAMILY HISTORY: Family History  Problem Relation Age of Onset   Heart attack Mother    Heart attack Father    Heart attack Sister    Heart attack Brother     SOCIAL HISTORY: Social History   Socioeconomic History   Marital status: Widowed    Spouse name: Not on file   Number of children: 5   Years of education: 10   Highest education level: Not on file  Occupational History   Occupation: Retired  Tobacco Use   Smoking status: Never   Smokeless tobacco: Never  Vaping Use   Vaping status: Never Used  Substance and Sexual Activity   Alcohol use:  No   Drug use: No   Sexual activity: Not on file  Other Topics Concern   Not on file  Social History Narrative   Patient is a widowed and lives with her daughter [place]).   Patient has three living children and two are deceased.   Patient is retired.   Patient has a 10th grade education.   Patient is right-handed.   Patient does not drink caffeine.      Social Drivers of Health   Tobacco Use: Low Risk (09/15/2022)   Patient History    Smoking Tobacco Use: Never    Smokeless Tobacco Use: Never    Passive Exposure: Not on file  Financial Resource Strain: Not on file  Food Insecurity: No Food Insecurity (09/16/2022)   Hunger Vital Sign    Worried About Running Out of Food in the Last Year: Never true    Ran Out of Food in the Last Year: Never true  Transportation Needs: No Transportation Needs (09/16/2022)   PRAPARE - Administrator, Civil Service (Medical): No    Lack of Transportation (Non-Medical): No  Physical Activity: Not on file  Stress: Not on file  Social Connections: Not on file  Intimate Partner Violence: Not At Risk (09/16/2022)   Humiliation, Afraid, Rape, and Kick questionnaire    Fear of Current or Ex-Partner: No    Emotionally Abused: No    Physically Abused: No    Sexually Abused: No  Depression (PHQ2-9): Not on file  Alcohol Screen: Not on file  Housing: Low Risk (09/16/2022)   Housing    Last Housing Risk Score: 0  Utilities: Not At Risk (09/16/2022)   AHC Utilities    Threatened with loss of utilities: No  Health Literacy: Not on file      PHYSICAL EXAM Generalized: Well developed, in no acute distress   Neurological examination  Mentation: Alert oriented to time, place, history taking.  Follows all commands speech and language fluent Cranial nerve II-XII:Extraocular movements were full.  Head turning and shoulder shrug  were normal and symmetric.   DIAGNOSTIC DATA (LABS, IMAGING, TESTING) - I reviewed patient records, labs, notes,  testing and imaging myself where available.  Lab Results  Component Value Date   WBC 4.5 09/17/2022   HGB 12.1 09/17/2022   HCT 37.2 09/17/2022   MCV 89.4 09/17/2022   PLT 150 09/17/2022      Component Value Date/Time   NA 136 09/17/2022 0649   NA 141 12/09/2015 0000   K 3.7 09/17/2022 0649   CL 102 09/17/2022 0649   CO2 24 09/17/2022 0649   GLUCOSE 88 09/17/2022 0649   BUN 7 (L) 09/17/2022 0649   BUN 14 12/09/2015 0000   CREATININE 0.90 09/17/2022 0649   CALCIUM  9.1 09/17/2022 0649   PROT 6.8 09/17/2022 0649   ALBUMIN 3.3 (L) 09/17/2022 0649   AST 21 09/17/2022 0649   ALT 15 09/17/2022 0649   ALKPHOS 76 09/17/2022 0649   BILITOT 0.6 09/17/2022 0649   GFRNONAA >60 09/17/2022 0649   GFRAA >60 10/06/2018 0240   Lab Results  Component Value Date   CHOL 153 12/02/2015   HDL 41 12/02/2015   LDLCALC 101 (H) 12/02/2015   TRIG 57 12/02/2015   CHOLHDL 3.7 12/02/2015   No results found for: HGBA1C No results found for: VITAMINB12 Lab Results  Component Value Date   TSH 1.98 12/09/2015      ASSESSMENT AND PLAN 84 y.o. year old female  has a past medical history of CAD (coronary artery disease) (1997 ), Cervical spine fracture (HCC), Chest pain, Constipation, Foot drop, left, Gait disturbance, Headache(784.0) (03/14/2013), Heart attack (HCC), Hemiparesis and alteration of sensations as late effects of stroke (HCC) (03/14/2013), Hypercholesterolemia, Hypertension, Osteoporosis, Palpitations, Periorbital hematoma of left eye, Peripheral vascular disease, Peroneal DVT (deep venous thrombosis) (HCC), Post-menopausal, PVD (peripheral vascular disease), Seizures (HCC), Stroke (HCC), Stroke (HCC) (2010), and Urinary incontinence. here with:  1.  Seizures  Continue Lamictal  100 mg twice a day Blood work has been ordered Advise if she has any seizure events to let us  know Follow-up in 1 year or sooner if needed  Duwaine Russell, MSN, NP-C 09/22/2024, 5:45 AM [facility] [address] [place], [place] [address] [phone]   The patient's condition requires frequent monitoring and adjustments in the treatment plan, reflecting the ongoing complexity of care.  This provider is the continuing focal point for all needed services for this condition.  "

## 2024-09-25 ENCOUNTER — Ambulatory Visit: Admitting: Cardiology

## 2024-10-12 ENCOUNTER — Ambulatory Visit: Admitting: Cardiology

## 2024-10-17 ENCOUNTER — Telehealth: Payer: Medicare PPO | Admitting: Adult Health

## 2025-05-10 ENCOUNTER — Ambulatory Visit: Admitting: Adult Health
